# Patient Record
Sex: Male | Born: 1990 | Race: White | Hispanic: No | Smoking: Former smoker
Health system: Southern US, Community
[De-identification: ages and names within clinical notes are randomized; demographics above are authoritative.]

## PROBLEM LIST (undated history)

## (undated) DIAGNOSIS — L089 Local infection of the skin and subcutaneous tissue, unspecified: Secondary | ICD-10-CM

## (undated) DIAGNOSIS — A4902 Methicillin resistant Staphylococcus aureus infection, unspecified site: Secondary | ICD-10-CM

## (undated) DIAGNOSIS — IMO0002 Reserved for concepts with insufficient information to code with codable children: Secondary | ICD-10-CM

## (undated) DIAGNOSIS — R112 Nausea with vomiting, unspecified: Secondary | ICD-10-CM

## (undated) DIAGNOSIS — K56609 Unspecified intestinal obstruction, unspecified as to partial versus complete obstruction: Secondary | ICD-10-CM

## (undated) DIAGNOSIS — K259 Gastric ulcer, unspecified as acute or chronic, without hemorrhage or perforation: Secondary | ICD-10-CM

## (undated) DIAGNOSIS — K659 Peritonitis, unspecified: Secondary | ICD-10-CM

## (undated) DIAGNOSIS — K5090 Crohn's disease, unspecified, without complications: Secondary | ICD-10-CM

## (undated) DIAGNOSIS — K759 Inflammatory liver disease, unspecified: Secondary | ICD-10-CM

## (undated) HISTORY — PX: OTHER SURGICAL HISTORY: SHX169

## (undated) HISTORY — PX: APPENDECTOMY: SHX54

## (undated) HISTORY — PX: STOMACH SURGERY: SHX791

## (undated) HISTORY — PX: ABDOMINAL SURGERY: SHX537

---

## 2010-04-03 ENCOUNTER — Emergency Department (HOSPITAL_COMMUNITY): Admission: EM | Admit: 2010-04-03 | Discharge: 2010-04-03 | Payer: Self-pay | Admitting: Emergency Medicine

## 2010-04-10 ENCOUNTER — Emergency Department (HOSPITAL_COMMUNITY): Admission: EM | Admit: 2010-04-10 | Discharge: 2010-04-11 | Payer: Self-pay | Admitting: Emergency Medicine

## 2010-04-11 ENCOUNTER — Emergency Department (HOSPITAL_COMMUNITY): Admission: EM | Admit: 2010-04-11 | Discharge: 2010-04-11 | Payer: Self-pay | Admitting: Emergency Medicine

## 2010-04-13 ENCOUNTER — Emergency Department (HOSPITAL_COMMUNITY): Admission: EM | Admit: 2010-04-13 | Discharge: 2010-04-13 | Payer: Self-pay | Admitting: Emergency Medicine

## 2010-04-14 ENCOUNTER — Emergency Department (HOSPITAL_COMMUNITY): Admission: EM | Admit: 2010-04-14 | Discharge: 2010-04-15 | Payer: Self-pay | Admitting: Emergency Medicine

## 2010-04-15 ENCOUNTER — Emergency Department: Payer: Self-pay

## 2010-05-27 ENCOUNTER — Encounter: Payer: Self-pay | Admitting: Emergency Medicine

## 2010-05-27 ENCOUNTER — Inpatient Hospital Stay (HOSPITAL_COMMUNITY): Admission: EM | Admit: 2010-05-27 | Discharge: 2010-05-28 | Payer: Self-pay | Admitting: General Surgery

## 2010-05-28 ENCOUNTER — Emergency Department (HOSPITAL_COMMUNITY): Admission: EM | Admit: 2010-05-28 | Discharge: 2010-05-29 | Payer: Self-pay | Admitting: Emergency Medicine

## 2010-05-30 ENCOUNTER — Emergency Department (HOSPITAL_COMMUNITY): Admission: EM | Admit: 2010-05-30 | Discharge: 2010-05-30 | Payer: Self-pay | Admitting: Emergency Medicine

## 2010-06-13 ENCOUNTER — Emergency Department: Payer: Self-pay | Admitting: Emergency Medicine

## 2010-06-19 ENCOUNTER — Emergency Department (HOSPITAL_COMMUNITY): Admission: EM | Admit: 2010-06-19 | Discharge: 2010-06-19 | Payer: Self-pay | Admitting: Emergency Medicine

## 2010-06-20 ENCOUNTER — Emergency Department (HOSPITAL_COMMUNITY): Admission: EM | Admit: 2010-06-20 | Discharge: 2010-06-20 | Payer: Self-pay | Admitting: Emergency Medicine

## 2010-06-21 ENCOUNTER — Emergency Department (HOSPITAL_COMMUNITY)
Admission: EM | Admit: 2010-06-21 | Discharge: 2010-06-21 | Payer: Self-pay | Source: Home / Self Care | Admitting: Emergency Medicine

## 2010-06-21 ENCOUNTER — Emergency Department (HOSPITAL_COMMUNITY): Admission: EM | Admit: 2010-06-21 | Discharge: 2010-06-21 | Payer: Self-pay | Admitting: Emergency Medicine

## 2010-06-22 ENCOUNTER — Inpatient Hospital Stay (HOSPITAL_COMMUNITY): Admission: EM | Admit: 2010-06-22 | Discharge: 2010-06-23 | Payer: Self-pay | Admitting: Emergency Medicine

## 2010-08-15 ENCOUNTER — Emergency Department (HOSPITAL_COMMUNITY)
Admission: EM | Admit: 2010-08-15 | Discharge: 2010-08-15 | Payer: Self-pay | Source: Home / Self Care | Admitting: Emergency Medicine

## 2010-10-28 ENCOUNTER — Emergency Department (HOSPITAL_COMMUNITY): Admission: EM | Admit: 2010-10-28 | Discharge: 2010-06-24 | Payer: Self-pay | Admitting: Emergency Medicine

## 2011-02-03 LAB — POCT I-STAT, CHEM 8
Creatinine, Ser: 0.8 mg/dL (ref 0.4–1.5)
Glucose, Bld: 107 mg/dL — ABNORMAL HIGH (ref 70–99)
Hemoglobin: 12.6 g/dL — ABNORMAL LOW (ref 13.0–17.0)
TCO2: 26 mmol/L (ref 0–100)

## 2011-02-04 LAB — CBC
HCT: 31.3 % — ABNORMAL LOW (ref 39.0–52.0)
HCT: 31.7 % — ABNORMAL LOW (ref 39.0–52.0)
HCT: 34.1 % — ABNORMAL LOW (ref 39.0–52.0)
HCT: 37.1 % — ABNORMAL LOW (ref 39.0–52.0)
Hemoglobin: 10.7 g/dL — ABNORMAL LOW (ref 13.0–17.0)
Hemoglobin: 11.2 g/dL — ABNORMAL LOW (ref 13.0–17.0)
MCH: 31.8 pg (ref 26.0–34.0)
MCH: 32.3 pg (ref 26.0–34.0)
MCH: 33.6 pg (ref 26.0–34.0)
MCHC: 32.8 g/dL (ref 30.0–36.0)
MCHC: 34.4 g/dL (ref 30.0–36.0)
MCV: 96.4 fL (ref 78.0–100.0)
MCV: 96.6 fL (ref 78.0–100.0)
Platelets: 215 10*3/uL (ref 150–400)
Platelets: 216 10*3/uL (ref 150–400)
RBC: 3.24 MIL/uL — ABNORMAL LOW (ref 4.22–5.81)
RBC: 3.47 MIL/uL — ABNORMAL LOW (ref 4.22–5.81)
RBC: 3.52 MIL/uL — ABNORMAL LOW (ref 4.22–5.81)
RDW: 14.8 % (ref 11.5–15.5)
RDW: 14.9 % (ref 11.5–15.5)
RDW: 15.2 % (ref 11.5–15.5)
WBC: 4.2 10*3/uL (ref 4.0–10.5)
WBC: 4.3 10*3/uL (ref 4.0–10.5)

## 2011-02-04 LAB — COMPREHENSIVE METABOLIC PANEL
ALT: 10 U/L (ref 0–53)
AST: 18 U/L (ref 0–37)
Albumin: 4.1 g/dL (ref 3.5–5.2)
Alkaline Phosphatase: 56 U/L (ref 39–117)
BUN: 3 mg/dL — ABNORMAL LOW (ref 6–23)
BUN: 6 mg/dL (ref 6–23)
CO2: 26 mEq/L (ref 19–32)
Calcium: 8.9 mg/dL (ref 8.4–10.5)
Chloride: 108 mEq/L (ref 96–112)
Creatinine, Ser: 1.11 mg/dL (ref 0.4–1.5)
GFR calc Af Amer: >60 mL/min (ref >60)
GFR calc non Af Amer: >60 mL/min (ref >60)
Glucose, Bld: 89 mg/dL (ref 70–99)
Potassium: 3.6 mEq/L (ref 3.5–5.1)
Sodium: 141 mEq/L (ref 135–145)
Total Bilirubin: 0.4 mg/dL (ref 0.3–1.2)
Total Bilirubin: 0.7 mg/dL (ref 0.3–1.2)
Total Protein: 6.5 g/dL (ref 6.0–8.3)

## 2011-02-04 LAB — URINALYSIS, ROUTINE W REFLEX MICROSCOPIC
Bilirubin Urine: NEGATIVE
Hgb urine dipstick: NEGATIVE
Specific Gravity, Urine: 1.015 (ref 1.005–1.030)
Urobilinogen, UA: 0.2 mg/dL (ref 0.0–1.0)
pH: 6.5 (ref 5.0–8.0)

## 2011-02-04 LAB — BASIC METABOLIC PANEL
BUN: 7 mg/dL (ref 6–23)
CO2: 28 mEq/L (ref 19–32)
Calcium: 9.4 mg/dL (ref 8.4–10.5)
GFR calc non Af Amer: >60 mL/min (ref >60)
Glucose, Bld: 113 mg/dL — ABNORMAL HIGH (ref 70–99)
Potassium: 3.8 mEq/L (ref 3.5–5.1)

## 2011-02-04 LAB — DIFFERENTIAL
Basophils Absolute: 0 10*3/uL (ref 0.0–0.1)
Basophils Relative: 0 % (ref 0–1)
Eosinophils Absolute: 0.1 10*3/uL (ref 0.0–0.7)
Eosinophils Relative: 2 % (ref 0–5)
Lymphocytes Relative: 39 % (ref 12–46)
Lymphs Abs: 2.5 10*3/uL (ref 0.7–4.0)
Monocytes Absolute: 0.8 10*3/uL (ref 0.1–1.0)
Monocytes Relative: 12 % (ref 3–12)
Neutro Abs: 1.8 10*3/uL (ref 1.7–7.7)
Neutro Abs: 2.9 10*3/uL (ref 1.7–7.7)
Neutrophils Relative %: 41 % — ABNORMAL LOW (ref 43–77)

## 2011-02-04 LAB — MAGNESIUM: Magnesium: 1.8 mg/dL (ref 1.5–2.5)

## 2011-02-04 LAB — H. PYLORI ANTIBODY, IGG: H Pylori IgG: 0.6 {ISR}

## 2011-02-04 LAB — PHOSPHORUS: Phosphorus: 4.2 mg/dL (ref 2.3–4.6)

## 2011-02-04 LAB — MRSA PCR SCREENING: MRSA by PCR: NEGATIVE

## 2011-02-04 LAB — PROTIME-INR: Prothrombin Time: 15.4 seconds — ABNORMAL HIGH (ref 11.6–15.2)

## 2011-02-05 LAB — CBC
HCT: 36.9 % — ABNORMAL LOW (ref 39.0–52.0)
HCT: 39.7 % (ref 39.0–52.0)
Hemoglobin: 12.6 g/dL — ABNORMAL LOW (ref 13.0–17.0)
Hemoglobin: 13.6 g/dL (ref 13.0–17.0)
MCH: 33.6 pg (ref 26.0–34.0)
MCHC: 34.3 g/dL (ref 30.0–36.0)
MCV: 98 fL (ref 78.0–100.0)
RDW: 15.7 % — ABNORMAL HIGH (ref 11.5–15.5)
WBC: 7.4 10*3/uL (ref 4.0–10.5)

## 2011-02-05 LAB — COMPREHENSIVE METABOLIC PANEL
ALT: 11 U/L (ref 0–53)
ALT: 12 U/L (ref 0–53)
AST: 18 U/L (ref 0–37)
Alkaline Phosphatase: 69 U/L (ref 39–117)
Alkaline Phosphatase: 70 U/L (ref 39–117)
BUN: 5 mg/dL — ABNORMAL LOW (ref 6–23)
CO2: 25 mEq/L (ref 19–32)
Calcium: 9.9 mg/dL (ref 8.4–10.5)
GFR calc Af Amer: >60 mL/min (ref >60)
GFR calc non Af Amer: >60 mL/min (ref >60)
Glucose, Bld: 93 mg/dL (ref 70–99)
Glucose, Bld: 97 mg/dL (ref 70–99)
Potassium: 3.6 mEq/L (ref 3.5–5.1)
Sodium: 142 mEq/L (ref 135–145)
Sodium: 144 mEq/L (ref 135–145)
Total Protein: 7.2 g/dL (ref 6.0–8.3)

## 2011-02-05 LAB — RAPID URINE DRUG SCREEN, HOSP PERFORMED
Barbiturates: NOT DETECTED
Benzodiazepines: NOT DETECTED
Benzodiazepines: NOT DETECTED
Tetrahydrocannabinol: POSITIVE — AB

## 2011-02-05 LAB — URINALYSIS, ROUTINE W REFLEX MICROSCOPIC
Ketones, ur: NEGATIVE mg/dL
Ketones, ur: NEGATIVE mg/dL
Nitrite: NEGATIVE
Protein, ur: NEGATIVE mg/dL
Protein, ur: NEGATIVE mg/dL
Urobilinogen, UA: 0.2 mg/dL (ref 0.0–1.0)

## 2011-02-05 LAB — LIPASE, BLOOD: Lipase: 30 U/L (ref 11–59)

## 2011-02-05 LAB — HEMOCCULT GUIAC POC 1CARD (OFFICE)
Fecal Occult Bld: NEGATIVE
Fecal Occult Bld: NEGATIVE

## 2011-02-05 LAB — DIFFERENTIAL
Basophils Relative: 0 % (ref 0–1)
Basophils Relative: 1 % (ref 0–1)
Eosinophils Absolute: 0 10*3/uL (ref 0.0–0.7)
Eosinophils Absolute: 0 10*3/uL (ref 0.0–0.7)
Eosinophils Relative: 0 % (ref 0–5)
Lymphs Abs: 1 10*3/uL (ref 0.7–4.0)
Lymphs Abs: 1.6 10*3/uL (ref 0.7–4.0)
Monocytes Absolute: 0.5 10*3/uL (ref 0.1–1.0)
Monocytes Relative: 7 % (ref 3–12)
Neutro Abs: 4.2 10*3/uL (ref 1.7–7.7)
Neutrophils Relative %: 64 % (ref 43–77)
Neutrophils Relative %: 79 % — ABNORMAL HIGH (ref 43–77)

## 2011-02-05 LAB — ABO/RH: ABO/RH(D): O POS

## 2011-02-05 LAB — PROTIME-INR: Prothrombin Time: 13.8 seconds (ref 11.6–15.2)

## 2011-02-05 LAB — TYPE AND SCREEN
ABO/RH(D): O POS
Antibody Screen: NEGATIVE

## 2011-02-06 LAB — DIFFERENTIAL
Basophils Absolute: 0.1 10*3/uL (ref 0.0–0.1)
Basophils Relative: 1 % (ref 0–1)
Eosinophils Relative: 1 % (ref 0–5)
Monocytes Absolute: 1 10*3/uL (ref 0.1–1.0)

## 2011-02-06 LAB — CBC
Hemoglobin: 14.5 g/dL (ref 13.0–17.0)
MCH: 33 pg (ref 26.0–34.0)
MCV: 94.6 fL (ref 78.0–100.0)
Platelets: 196 10*3/uL (ref 150–400)
Platelets: 248 10*3/uL (ref 150–400)
RBC: 3.75 MIL/uL — ABNORMAL LOW (ref 4.22–5.81)
RBC: 4.38 MIL/uL (ref 4.22–5.81)
RDW: 16.6 % — ABNORMAL HIGH (ref 11.5–15.5)
WBC: 5 10*3/uL (ref 4.0–10.5)
WBC: 6.8 10*3/uL (ref 4.0–10.5)

## 2011-02-06 LAB — BASIC METABOLIC PANEL
BUN: 10 mg/dL (ref 6–23)
Calcium: 9.6 mg/dL (ref 8.4–10.5)
Creatinine, Ser: 1.05 mg/dL (ref 0.4–1.5)
GFR calc Af Amer: >60 mL/min (ref >60)
GFR calc non Af Amer: >60 mL/min (ref >60)

## 2011-02-06 LAB — COMPREHENSIVE METABOLIC PANEL
AST: 33 U/L (ref 0–37)
Albumin: 4.8 g/dL (ref 3.5–5.2)
Alkaline Phosphatase: 69 U/L (ref 39–117)
BUN: 17 mg/dL (ref 6–23)
CO2: 28 mEq/L (ref 19–32)
Chloride: 99 mEq/L (ref 96–112)
Creatinine, Ser: 1.33 mg/dL (ref 0.4–1.5)
GFR calc Af Amer: >60 mL/min (ref >60)
GFR calc non Af Amer: >60 mL/min (ref >60)
Potassium: 4.2 mEq/L (ref 3.5–5.1)
Total Bilirubin: 1 mg/dL (ref 0.3–1.2)

## 2011-02-06 LAB — GLUCOSE, CAPILLARY: Glucose-Capillary: 119 mg/dL — ABNORMAL HIGH (ref 70–99)

## 2011-02-07 LAB — COMPREHENSIVE METABOLIC PANEL
ALT: 12 U/L (ref 0–53)
AST: 20 U/L (ref 0–37)
AST: 21 U/L (ref 0–37)
Alkaline Phosphatase: 58 U/L (ref 39–117)
BUN: 8 mg/dL (ref 6–23)
BUN: 9 mg/dL (ref 6–23)
CO2: 27 mEq/L (ref 19–32)
CO2: 26 mEq/L (ref 19–32)
Calcium: 9.4 mg/dL (ref 8.4–10.5)
Calcium: 9.4 mg/dL (ref 8.4–10.5)
Calcium: 9.1 mg/dL (ref 8.4–10.5)
Chloride: 105 mEq/L (ref 96–112)
Creatinine, Ser: 1.05 mg/dL (ref 0.4–1.5)
Creatinine, Ser: 0.91 mg/dL (ref 0.4–1.5)
GFR calc Af Amer: >60 mL/min (ref >60)
GFR calc non Af Amer: >60 mL/min (ref >60)
GFR calc non Af Amer: >60 mL/min (ref >60)
Glucose, Bld: 105 mg/dL — ABNORMAL HIGH (ref 70–99)
Glucose, Bld: 122 mg/dL — ABNORMAL HIGH (ref 70–99)
Potassium: 4 mEq/L (ref 3.5–5.1)
Sodium: 135 mEq/L (ref 135–145)
Total Bilirubin: 0.6 mg/dL (ref 0.3–1.2)
Total Protein: 7.1 g/dL (ref 6.0–8.3)

## 2011-02-07 LAB — URINALYSIS, ROUTINE W REFLEX MICROSCOPIC
Bilirubin Urine: NEGATIVE
Glucose, UA: NEGATIVE mg/dL
Ketones, ur: NEGATIVE mg/dL
pH: 6 (ref 5.0–8.0)

## 2011-02-07 LAB — DIFFERENTIAL
Basophils Absolute: 0 10*3/uL (ref 0.0–0.1)
Basophils Relative: 0 % (ref 0–1)
Eosinophils Absolute: 0.1 10*3/uL (ref 0.0–0.7)
Eosinophils Absolute: 0.1 10*3/uL (ref 0.0–0.7)
Eosinophils Relative: 1 % (ref 0–5)
Eosinophils Relative: 1 % (ref 0–5)
Lymphocytes Relative: 23 % (ref 12–46)
Lymphs Abs: 1.6 10*3/uL (ref 0.7–4.0)
Lymphs Abs: 2 10*3/uL (ref 0.7–4.0)
Lymphs Abs: 1.4 10*3/uL (ref 0.7–4.0)
Monocytes Relative: 9 % (ref 3–12)
Monocytes Relative: 9 % (ref 3–12)
Neutro Abs: 5.1 10*3/uL (ref 1.7–7.7)
Neutrophils Relative %: 65 % (ref 43–77)
Neutrophils Relative %: 63 % (ref 43–77)

## 2011-02-07 LAB — LIPASE, BLOOD
Lipase: 21 U/L (ref 11–59)
Lipase: 18 U/L (ref 11–59)

## 2011-02-07 LAB — CBC
HCT: 35.7 % — ABNORMAL LOW (ref 39.0–52.0)
HCT: 37 % — ABNORMAL LOW (ref 39.0–52.0)
Hemoglobin: 11.8 g/dL — ABNORMAL LOW (ref 13.0–17.0)
Hemoglobin: 12.6 g/dL — ABNORMAL LOW (ref 13.0–17.0)
MCHC: 33 g/dL (ref 30.0–36.0)
MCHC: 33 g/dL (ref 30.0–36.0)
MCHC: 33.9 g/dL (ref 30.0–36.0)
MCV: 95.1 fL (ref 78.0–100.0)
RBC: 3.93 MIL/uL — ABNORMAL LOW (ref 4.22–5.81)
RBC: 3.9 MIL/uL — ABNORMAL LOW (ref 4.22–5.81)
RDW: 19.2 % — ABNORMAL HIGH (ref 11.5–15.5)
WBC: 10.1 10*3/uL (ref 4.0–10.5)
WBC: 6.3 10*3/uL (ref 4.0–10.5)

## 2011-02-07 LAB — PROTIME-INR: Prothrombin Time: 12.4 seconds (ref 11.6–15.2)

## 2011-02-07 LAB — APTT: aPTT: 35 seconds (ref 24–37)

## 2011-06-18 ENCOUNTER — Emergency Department (HOSPITAL_BASED_OUTPATIENT_CLINIC_OR_DEPARTMENT_OTHER)
Admission: EM | Admit: 2011-06-18 | Discharge: 2011-06-18 | Disposition: A | Discharge status: Home / Self Care | Payer: Self-pay | Attending: Emergency Medicine | Admitting: Emergency Medicine

## 2011-06-18 ENCOUNTER — Encounter: Payer: Self-pay | Admitting: *Deleted

## 2011-06-18 ENCOUNTER — Emergency Department (INDEPENDENT_AMBULATORY_CARE_PROVIDER_SITE_OTHER): Payer: Self-pay

## 2011-06-18 DIAGNOSIS — S20219A Contusion of unspecified front wall of thorax, initial encounter: Secondary | ICD-10-CM | POA: Insufficient documentation

## 2011-06-18 DIAGNOSIS — S298XXA Other specified injuries of thorax, initial encounter: Secondary | ICD-10-CM

## 2011-06-18 DIAGNOSIS — S335XXA Sprain of ligaments of lumbar spine, initial encounter: Secondary | ICD-10-CM | POA: Insufficient documentation

## 2011-06-18 DIAGNOSIS — R109 Unspecified abdominal pain: Secondary | ICD-10-CM | POA: Insufficient documentation

## 2011-06-18 DIAGNOSIS — W19XXXA Unspecified fall, initial encounter: Secondary | ICD-10-CM | POA: Insufficient documentation

## 2011-06-18 DIAGNOSIS — S301XXA Contusion of abdominal wall, initial encounter: Secondary | ICD-10-CM | POA: Insufficient documentation

## 2011-06-18 DIAGNOSIS — R112 Nausea with vomiting, unspecified: Secondary | ICD-10-CM | POA: Insufficient documentation

## 2011-06-18 DIAGNOSIS — K5090 Crohn's disease, unspecified, without complications: Secondary | ICD-10-CM | POA: Insufficient documentation

## 2011-06-18 HISTORY — DX: Crohn's disease, unspecified, without complications: K50.90

## 2011-06-18 HISTORY — DX: Gastric ulcer, unspecified as acute or chronic, without hemorrhage or perforation: K25.9

## 2011-06-18 LAB — COMPREHENSIVE METABOLIC PANEL
Albumin: 4.1 g/dL (ref 3.5–5.2)
Alkaline Phosphatase: 65 U/L (ref 39–117)
BUN: 16 mg/dL (ref 6–23)
CO2: 27 mEq/L (ref 19–32)
Chloride: 101 mEq/L (ref 96–112)
Creatinine, Ser: 0.9 mg/dL (ref 0.50–1.35)
GFR calc non Af Amer: >60 mL/min (ref >60)
Glucose, Bld: 98 mg/dL (ref 70–99)
Potassium: 4.5 mEq/L (ref 3.5–5.1)
Total Bilirubin: 0.1 mg/dL — ABNORMAL LOW (ref 0.3–1.2)

## 2011-06-18 LAB — CBC
HCT: 34.7 % — ABNORMAL LOW (ref 39.0–52.0)
Hemoglobin: 11.8 g/dL — ABNORMAL LOW (ref 13.0–17.0)
MCV: 98.3 fL (ref 78.0–100.0)
RBC: 3.53 MIL/uL — ABNORMAL LOW (ref 4.22–5.81)
RDW: 13.3 % (ref 11.5–15.5)
WBC: 7.4 10*3/uL (ref 4.0–10.5)

## 2011-06-18 LAB — URINALYSIS, ROUTINE W REFLEX MICROSCOPIC
Glucose, UA: NEGATIVE mg/dL
Ketones, ur: NEGATIVE mg/dL
Leukocytes, UA: NEGATIVE
Protein, ur: NEGATIVE mg/dL
Urobilinogen, UA: 0.2 mg/dL (ref 0.0–1.0)

## 2011-06-18 MED ORDER — HYDROMORPHONE HCL 1 MG/ML IJ SOLN
1.0000 mg | Freq: Once | INTRAMUSCULAR | Status: AC
  Administered 2011-06-18: 1 mg via INTRAVENOUS

## 2011-06-18 MED ORDER — FENTANYL CITRATE 0.05 MG/ML IJ SOLN
50.0000 ug | Freq: Once | INTRAMUSCULAR | Status: AC
  Administered 2011-06-18: 50 ug via INTRAVENOUS
  Filled 2011-06-18: qty 2

## 2011-06-18 MED ORDER — ONDANSETRON HCL 4 MG/2ML IJ SOLN
4.0000 mg | Freq: Once | INTRAMUSCULAR | Status: AC
  Administered 2011-06-18: 4 mg via INTRAVENOUS
  Filled 2011-06-18: qty 2

## 2011-06-18 MED ORDER — HYDROMORPHONE HCL 1 MG/ML IJ SOLN
INTRAMUSCULAR | Status: AC
  Administered 2011-06-18: 1 mg via INTRAVENOUS
  Filled 2011-06-18: qty 1

## 2011-06-18 MED ORDER — SODIUM CHLORIDE 0.9 % IV SOLN
Freq: Once | INTRAVENOUS | Status: AC
  Administered 2011-06-18: 12:00:00 via INTRAVENOUS

## 2011-06-18 MED ORDER — HYDROCODONE-ACETAMINOPHEN 5-500 MG PO TABS: 1.0000 | ORAL_TABLET | Freq: Four times a day (QID) | ORAL | Status: DC | PRN

## 2011-06-18 MED ORDER — HYDROMORPHONE HCL 1 MG/ML IJ SOLN
1.0000 mg | Freq: Once | INTRAMUSCULAR | Status: AC
  Administered 2011-06-18: 1 mg via INTRAVENOUS
  Filled 2011-06-18: qty 1
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
## 2011-06-24 ENCOUNTER — Emergency Department (INDEPENDENT_AMBULATORY_CARE_PROVIDER_SITE_OTHER): Payer: Self-pay

## 2011-06-24 ENCOUNTER — Emergency Department (HOSPITAL_BASED_OUTPATIENT_CLINIC_OR_DEPARTMENT_OTHER)
Admission: EM | Admit: 2011-06-24 | Discharge: 2011-06-24 | Disposition: A | Discharge status: Home / Self Care | Payer: Self-pay | Attending: Emergency Medicine | Admitting: Emergency Medicine

## 2011-06-24 ENCOUNTER — Encounter (HOSPITAL_BASED_OUTPATIENT_CLINIC_OR_DEPARTMENT_OTHER): Payer: Self-pay | Admitting: *Deleted

## 2011-06-24 DIAGNOSIS — R1084 Generalized abdominal pain: Secondary | ICD-10-CM

## 2011-06-24 DIAGNOSIS — R0602 Shortness of breath: Secondary | ICD-10-CM

## 2011-06-24 DIAGNOSIS — K5090 Crohn's disease, unspecified, without complications: Secondary | ICD-10-CM | POA: Insufficient documentation

## 2011-06-24 DIAGNOSIS — F17200 Nicotine dependence, unspecified, uncomplicated: Secondary | ICD-10-CM

## 2011-06-24 DIAGNOSIS — N200 Calculus of kidney: Secondary | ICD-10-CM

## 2011-06-24 DIAGNOSIS — R1011 Right upper quadrant pain: Secondary | ICD-10-CM

## 2011-06-24 DIAGNOSIS — R110 Nausea: Secondary | ICD-10-CM | POA: Insufficient documentation

## 2011-06-24 DIAGNOSIS — R079 Chest pain, unspecified: Secondary | ICD-10-CM

## 2011-06-24 MED ORDER — ONDANSETRON HCL 4 MG/2ML IJ SOLN
INTRAMUSCULAR | Status: AC
  Administered 2011-06-24: 4 mg via INTRAVENOUS
  Filled 2011-06-24: qty 2

## 2011-06-24 MED ORDER — BELLADONNA ALK-PHENOBARBITAL 16.2 MG PO TABS: 1.0000 | ORAL_TABLET | ORAL | Status: AC | PRN

## 2011-06-24 MED ORDER — ONDANSETRON HCL 4 MG/2ML IJ SOLN
4.0000 mg | Freq: Once | INTRAMUSCULAR | Status: AC
  Administered 2011-06-24: 4 mg via INTRAVENOUS
  Filled 2011-06-24: qty 2

## 2011-06-24 MED ORDER — HYDROMORPHONE HCL 1 MG/ML IJ SOLN
0.5000 mg | Freq: Once | INTRAMUSCULAR | Status: AC
  Administered 2011-06-24: 0.5 mg via INTRAVENOUS
  Filled 2011-06-24: qty 1

## 2011-06-24 MED ORDER — DIPHENHYDRAMINE HCL 50 MG/ML IJ SOLN
25.0000 mg | Freq: Once | INTRAMUSCULAR | Status: AC
  Administered 2011-06-24: 25 mg via INTRAVENOUS
  Filled 2011-06-24: qty 1

## 2011-06-24 MED ORDER — HYDROMORPHONE HCL 1 MG/ML IJ SOLN
0.5000 mg | Freq: Once | INTRAMUSCULAR | Status: AC
  Administered 2011-06-24: 0.5 mg via INTRAVENOUS

## 2011-06-24 MED ORDER — ONDANSETRON HCL 4 MG/2ML IJ SOLN
4.0000 mg | Freq: Once | INTRAMUSCULAR | Status: AC
  Administered 2011-06-24: 4 mg via INTRAVENOUS

## 2011-06-24 MED ORDER — DIPHENHYDRAMINE HCL 50 MG/ML IJ SOLN
25.0000 mg | Freq: Once | INTRAMUSCULAR | Status: AC
  Administered 2011-06-24: 25 mg via INTRAVENOUS

## 2011-06-24 NOTE — ED Notes (Signed)
Pt continues to request additional pain medication. Pt will drink PO contrast at 1800 and CT will be performed at 1900.
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
## 2011-06-24 NOTE — ED Notes (Signed)
Dr Dierdre Highman at bedside to discuss test results and d/c instructions

## 2011-06-24 NOTE — ED Notes (Signed)
Report received from [facility]. Assuming care of pt at this time. Pt states he is still hurting (8/10) and c/o itching to face. MD made aware and gave V.O for benadryl IV only, and states he would be in room to talk to pt shortly about test results.
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
## 2011-06-24 NOTE — ED Notes (Signed)
Pt finished 1st cup of oral contrast.

## 2011-06-24 NOTE — ED Notes (Addendum)
PT sts he is allergic to IVP dye. Dr. Theodoro Kalata informed. Order changed to PO contrast only, no IV contrast.

## 2011-12-20 ENCOUNTER — Emergency Department (HOSPITAL_COMMUNITY)
Admission: EM | Admit: 2011-12-20 | Discharge: 2011-12-20 | Discharge status: Against Medical Advice | Payer: No Typology Code available for payment source | Attending: Emergency Medicine | Admitting: Emergency Medicine

## 2011-12-20 DIAGNOSIS — Z043 Encounter for examination and observation following other accident: Secondary | ICD-10-CM | POA: Insufficient documentation

## 2011-12-25 ENCOUNTER — Encounter (HOSPITAL_COMMUNITY): Payer: Self-pay | Admitting: Emergency Medicine

## 2011-12-25 ENCOUNTER — Emergency Department (HOSPITAL_COMMUNITY): Payer: No Typology Code available for payment source

## 2011-12-25 ENCOUNTER — Emergency Department (HOSPITAL_COMMUNITY)
Admission: EM | Admit: 2011-12-25 | Discharge: 2011-12-25 | Disposition: A | Discharge status: Home / Self Care | Payer: No Typology Code available for payment source | Attending: Emergency Medicine | Admitting: Emergency Medicine

## 2011-12-25 DIAGNOSIS — M549 Dorsalgia, unspecified: Secondary | ICD-10-CM

## 2011-12-25 DIAGNOSIS — M545 Low back pain, unspecified: Secondary | ICD-10-CM | POA: Insufficient documentation

## 2011-12-25 DIAGNOSIS — R1110 Vomiting, unspecified: Secondary | ICD-10-CM | POA: Insufficient documentation

## 2011-12-25 DIAGNOSIS — R10812 Left upper quadrant abdominal tenderness: Secondary | ICD-10-CM | POA: Insufficient documentation

## 2011-12-25 DIAGNOSIS — R0602 Shortness of breath: Secondary | ICD-10-CM | POA: Insufficient documentation

## 2011-12-25 DIAGNOSIS — F17200 Nicotine dependence, unspecified, uncomplicated: Secondary | ICD-10-CM | POA: Insufficient documentation

## 2011-12-25 DIAGNOSIS — R079 Chest pain, unspecified: Secondary | ICD-10-CM | POA: Insufficient documentation

## 2011-12-25 DIAGNOSIS — R109 Unspecified abdominal pain: Secondary | ICD-10-CM | POA: Insufficient documentation

## 2011-12-25 LAB — COMPREHENSIVE METABOLIC PANEL
ALT: 8 U/L (ref 0–53)
AST: 14 U/L (ref 0–37)
Albumin: 4.7 g/dL (ref 3.5–5.2)
Calcium: 10.2 mg/dL (ref 8.4–10.5)
Creatinine, Ser: 0.82 mg/dL (ref 0.50–1.35)
Sodium: 142 mEq/L (ref 135–145)
Total Protein: 8 g/dL (ref 6.0–8.3)

## 2011-12-25 LAB — DIFFERENTIAL
Basophils Absolute: 0 10*3/uL (ref 0.0–0.1)
Basophils Relative: 1 % (ref 0–1)
Eosinophils Relative: 1 % (ref 0–5)
Monocytes Absolute: 0.7 10*3/uL (ref 0.1–1.0)

## 2011-12-25 LAB — CBC
HCT: 40.6 % (ref 39.0–52.0)
MCHC: 33.7 g/dL (ref 30.0–36.0)
MCV: 95.1 fL (ref 78.0–100.0)
Platelets: 258 10*3/uL (ref 150–400)
RDW: 13 % (ref 11.5–15.5)
WBC: 6.7 10*3/uL (ref 4.0–10.5)

## 2011-12-25 MED ORDER — DIPHENHYDRAMINE HCL 50 MG/ML IJ SOLN
INTRAMUSCULAR | Status: AC
  Filled 2011-12-25: qty 1

## 2011-12-25 MED ORDER — FENTANYL CITRATE 0.05 MG/ML IJ SOLN
50.0000 ug | Freq: Once | INTRAMUSCULAR | Status: AC
  Administered 2011-12-25: 50 ug via INTRAVENOUS
  Filled 2011-12-25: qty 2

## 2011-12-25 MED ORDER — METHYLPREDNISOLONE SODIUM SUCC 125 MG IJ SOLR
125.0000 mg | Freq: Once | INTRAMUSCULAR | Status: AC
  Administered 2011-12-25: 125 mg via INTRAVENOUS
  Filled 2011-12-25: qty 2

## 2011-12-25 MED ORDER — DIPHENHYDRAMINE HCL 50 MG/ML IJ SOLN
25.0000 mg | Freq: Once | INTRAMUSCULAR | Status: AC
  Administered 2011-12-25: 25 mg via INTRAVENOUS
  Filled 2011-12-25: qty 1

## 2011-12-25 MED ORDER — METHOCARBAMOL 500 MG PO TABS: 500.0000 mg | ORAL_TABLET | Freq: Two times a day (BID) | ORAL | Status: AC

## 2011-12-25 MED ORDER — HYDROMORPHONE HCL PF 1 MG/ML IJ SOLN: 1.0000 mg | Freq: Once | INTRAMUSCULAR | Status: DC

## 2011-12-25 MED ORDER — ONDANSETRON HCL 4 MG/2ML IJ SOLN
4.0000 mg | Freq: Once | INTRAMUSCULAR | Status: AC
  Administered 2011-12-25: 4 mg via INTRAVENOUS
  Filled 2011-12-25: qty 2

## 2011-12-25 MED ORDER — DIPHENHYDRAMINE HCL 50 MG/ML IJ SOLN
25.0000 mg | Freq: Once | INTRAMUSCULAR | Status: AC
  Administered 2011-12-25: 25 mg via INTRAVENOUS

## 2011-12-25 MED ORDER — IOHEXOL 300 MG/ML  SOLN
80.0000 mL | Freq: Once | INTRAMUSCULAR | Status: AC | PRN
  Administered 2011-12-25: 80 mL via INTRAVENOUS

## 2011-12-25 NOTE — ED Notes (Signed)
Pt has flat abdomen and bowel sounds present.  No blood in urine.  No loc.  Pt medicated and awaiting CT
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
## 2011-12-25 NOTE — ED Notes (Signed)
Pt stated the fentanyl is not controlling his pain.  Will notify MD.

## 2011-12-25 NOTE — ED Notes (Signed)
Ct aware that CT prep given and they said they would have to wait one hour before coming

## 2011-12-26 NOTE — ED Provider Notes (Signed)
Medical screening examination/treatment/procedure(s) were performed by non-physician practitioner and as supervising physician I was immediately available for consultation/collaboration.   Gwyneth Sprout, MD 12/26/11 0001

## 2012-06-08 ENCOUNTER — Emergency Department: Payer: Self-pay | Admitting: Emergency Medicine

## 2013-11-21 DIAGNOSIS — K659 Peritonitis, unspecified: Secondary | ICD-10-CM

## 2013-11-21 HISTORY — DX: Peritonitis, unspecified: K65.9

## 2013-11-21 HISTORY — PX: APPENDECTOMY: SHX54

## 2013-11-21 HISTORY — PX: ABDOMINAL SURGERY: SHX537

## 2014-06-23 ENCOUNTER — Encounter (HOSPITAL_COMMUNITY): Payer: Self-pay | Admitting: Emergency Medicine

## 2014-06-23 ENCOUNTER — Emergency Department (HOSPITAL_COMMUNITY)
Admission: EM | Admit: 2014-06-23 | Discharge: 2014-06-24 | Disposition: A | Discharge status: Home / Self Care | Payer: Self-pay | Attending: Emergency Medicine | Admitting: Emergency Medicine

## 2014-06-23 DIAGNOSIS — Y838 Other surgical procedures as the cause of abnormal reaction of the patient, or of later complication, without mention of misadventure at the time of the procedure: Secondary | ICD-10-CM | POA: Insufficient documentation

## 2014-06-23 DIAGNOSIS — R109 Unspecified abdominal pain: Secondary | ICD-10-CM | POA: Insufficient documentation

## 2014-06-23 DIAGNOSIS — Z8719 Personal history of other diseases of the digestive system: Secondary | ICD-10-CM | POA: Insufficient documentation

## 2014-06-23 DIAGNOSIS — Z880 Allergy status to penicillin: Secondary | ICD-10-CM | POA: Insufficient documentation

## 2014-06-23 DIAGNOSIS — R112 Nausea with vomiting, unspecified: Secondary | ICD-10-CM | POA: Insufficient documentation

## 2014-06-23 DIAGNOSIS — T8140XA Infection following a procedure, unspecified, initial encounter: Secondary | ICD-10-CM | POA: Insufficient documentation

## 2014-06-23 DIAGNOSIS — L089 Local infection of the skin and subcutaneous tissue, unspecified: Secondary | ICD-10-CM

## 2014-06-23 DIAGNOSIS — F17200 Nicotine dependence, unspecified, uncomplicated: Secondary | ICD-10-CM | POA: Insufficient documentation

## 2014-06-23 DIAGNOSIS — S31109A Unspecified open wound of abdominal wall, unspecified quadrant without penetration into peritoneal cavity, initial encounter: Secondary | ICD-10-CM

## 2014-06-23 DIAGNOSIS — Z872 Personal history of diseases of the skin and subcutaneous tissue: Secondary | ICD-10-CM | POA: Insufficient documentation

## 2014-06-23 HISTORY — DX: Reserved for concepts with insufficient information to code with codable children: IMO0002

## 2014-06-23 HISTORY — DX: Unspecified intestinal obstruction, unspecified as to partial versus complete obstruction: K56.609

## 2014-06-23 LAB — COMPREHENSIVE METABOLIC PANEL
ALK PHOS: 101 U/L (ref 39–117)
ALT: 21 U/L (ref 0–53)
AST: 23 U/L (ref 0–37)
Albumin: 3.6 g/dL (ref 3.5–5.2)
Anion gap: 12 (ref 5–15)
BUN: 9 mg/dL (ref 6–23)
CO2: 25 mEq/L (ref 19–32)
Calcium: 9.2 mg/dL (ref 8.4–10.5)
Chloride: 97 mEq/L (ref 96–112)
Creatinine, Ser: 0.64 mg/dL (ref 0.50–1.35)
GFR calc Af Amer: >90 mL/min (ref >90)
GFR calc non Af Amer: >90 mL/min (ref >90)
Glucose, Bld: 97 mg/dL (ref 70–99)
Potassium: 4.3 mEq/L (ref 3.7–5.3)
SODIUM: 134 meq/L — AB (ref 137–147)
TOTAL PROTEIN: 7.6 g/dL (ref 6.0–8.3)
Total Bilirubin: 0.2 mg/dL — ABNORMAL LOW (ref 0.3–1.2)

## 2014-06-23 LAB — CBC WITH DIFFERENTIAL/PLATELET
BASOS PCT: 0 % (ref 0–1)
Basophils Absolute: 0 10*3/uL (ref 0.0–0.1)
EOS PCT: 5 % (ref 0–5)
Eosinophils Absolute: 0.3 10*3/uL (ref 0.0–0.7)
HCT: 31.9 % — ABNORMAL LOW (ref 39.0–52.0)
HEMOGLOBIN: 10.4 g/dL — AB (ref 13.0–17.0)
Lymphocytes Relative: 35 % (ref 12–46)
Lymphs Abs: 1.9 10*3/uL (ref 0.7–4.0)
MCH: 29.2 pg (ref 26.0–34.0)
MCHC: 32.6 g/dL (ref 30.0–36.0)
MCV: 89.6 fL (ref 78.0–100.0)
MONO ABS: 1.2 10*3/uL — AB (ref 0.1–1.0)
Monocytes Relative: 21 % — ABNORMAL HIGH (ref 3–12)
Neutro Abs: 2.1 10*3/uL (ref 1.7–7.7)
Neutrophils Relative %: 39 % — ABNORMAL LOW (ref 43–77)
Platelets: 300 10*3/uL (ref 150–400)
RBC: 3.56 MIL/uL — ABNORMAL LOW (ref 4.22–5.81)
RDW: 13.7 % (ref 11.5–15.5)
WBC: 5.5 10*3/uL (ref 4.0–10.5)

## 2014-06-23 MED ORDER — METHYLPREDNISOLONE SODIUM SUCC 125 MG IJ SOLR
125.0000 mg | Freq: Once | INTRAMUSCULAR | Status: AC
  Administered 2014-06-23: 125 mg via INTRAVENOUS
  Filled 2014-06-23: qty 2

## 2014-06-23 MED ORDER — HYDROMORPHONE HCL PF 1 MG/ML IJ SOLN
1.0000 mg | Freq: Once | INTRAMUSCULAR | Status: AC
  Administered 2014-06-23: 1 mg via INTRAVENOUS
  Filled 2014-06-23: qty 1

## 2014-06-23 MED ORDER — ONDANSETRON 4 MG PO TBDP
8.0000 mg | ORAL_TABLET | Freq: Once | ORAL | Status: AC
  Administered 2014-06-24: 8 mg via ORAL
  Filled 2014-06-23: qty 2

## 2014-06-23 MED ORDER — DIPHENHYDRAMINE HCL 50 MG/ML IJ SOLN
50.0000 mg | Freq: Once | INTRAMUSCULAR | Status: AC
  Administered 2014-06-23: 50 mg via INTRAVENOUS
  Filled 2014-06-23: qty 1

## 2014-06-23 MED ORDER — ONDANSETRON HCL 4 MG/2ML IJ SOLN
4.0000 mg | Freq: Once | INTRAMUSCULAR | Status: AC
  Administered 2014-06-23: 4 mg via INTRAVENOUS
  Filled 2014-06-23: qty 2

## 2014-06-23 NOTE — ED Notes (Signed)
Wet to dry dressing placed to abdominal wound.  Pt tolerated well.
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
## 2014-06-23 NOTE — ED Notes (Signed)
Abdominal binder placed per v.o. Dr Lenna Sciara.
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
## 2014-06-23 NOTE — ED Notes (Signed)
The patient is unable to give an urine specimen at this time. The patient has been advised to use call light for assistance. The tech has reported to the RN in charge.

## 2014-06-23 NOTE — ED Notes (Signed)
Fluid challenge initiated 
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
## 2014-06-24 MED ORDER — OXYCODONE-ACETAMINOPHEN 5-325 MG PO TABS: 1.0000 | ORAL_TABLET | Freq: Four times a day (QID) | ORAL | Status: DC | PRN

## 2014-06-24 MED ORDER — METRONIDAZOLE 500 MG PO TABS
500.0000 mg | ORAL_TABLET | Freq: Once | ORAL | Status: AC
  Administered 2014-06-24: 500 mg via ORAL
  Filled 2014-06-24: qty 1

## 2014-06-24 MED ORDER — CIPROFLOXACIN HCL 500 MG PO TABS
500.0000 mg | ORAL_TABLET | Freq: Once | ORAL | Status: AC
  Administered 2014-06-24: 500 mg via ORAL
  Filled 2014-06-24: qty 1

## 2014-06-24 MED ORDER — OXYCODONE-ACETAMINOPHEN 5-325 MG PO TABS
1.0000 | ORAL_TABLET | Freq: Once | ORAL | Status: AC
  Administered 2014-06-24: 1 via ORAL
  Filled 2014-06-24: qty 1

## 2014-06-24 MED ORDER — METRONIDAZOLE 500 MG PO TABS: 500.0000 mg | ORAL_TABLET | Freq: Two times a day (BID) | ORAL | Status: DC

## 2014-06-24 MED ORDER — ONDANSETRON HCL 8 MG PO TABS: 8.0000 mg | ORAL_TABLET | Freq: Three times a day (TID) | ORAL | Status: DC | PRN

## 2014-06-24 MED ORDER — CIPROFLOXACIN HCL 500 MG PO TABS: 500.0000 mg | ORAL_TABLET | Freq: Two times a day (BID) | ORAL | Status: DC

## 2014-06-24 NOTE — Discharge Instructions (Signed)
A wet to dry dressing should be placed on the wound daily and abdominal binder replaced. Return for vomiting fever or concern for any reason. Medicine has been prescribed for pain and for nausea as well as antibiotics.

## 2014-12-05 ENCOUNTER — Encounter (HOSPITAL_COMMUNITY): Payer: Self-pay | Admitting: Emergency Medicine

## 2015-05-16 ENCOUNTER — Emergency Department (HOSPITAL_COMMUNITY): Payer: Self-pay

## 2015-05-16 ENCOUNTER — Emergency Department (HOSPITAL_COMMUNITY)
Admission: EM | Admit: 2015-05-16 | Discharge: 2015-05-16 | Disposition: A | Discharge status: Home / Self Care | Payer: Self-pay | Attending: Emergency Medicine | Admitting: Emergency Medicine

## 2015-05-16 ENCOUNTER — Encounter (HOSPITAL_COMMUNITY): Payer: Self-pay | Admitting: *Deleted

## 2015-05-16 DIAGNOSIS — Y9355 Activity, bike riding: Secondary | ICD-10-CM | POA: Insufficient documentation

## 2015-05-16 DIAGNOSIS — Z880 Allergy status to penicillin: Secondary | ICD-10-CM | POA: Insufficient documentation

## 2015-05-16 DIAGNOSIS — Z720 Tobacco use: Secondary | ICD-10-CM | POA: Insufficient documentation

## 2015-05-16 DIAGNOSIS — Y998 Other external cause status: Secondary | ICD-10-CM | POA: Insufficient documentation

## 2015-05-16 DIAGNOSIS — Z8719 Personal history of other diseases of the digestive system: Secondary | ICD-10-CM | POA: Insufficient documentation

## 2015-05-16 DIAGNOSIS — Y92410 Unspecified street and highway as the place of occurrence of the external cause: Secondary | ICD-10-CM | POA: Insufficient documentation

## 2015-05-16 DIAGNOSIS — R109 Unspecified abdominal pain: Secondary | ICD-10-CM

## 2015-05-16 DIAGNOSIS — S3991XA Unspecified injury of abdomen, initial encounter: Secondary | ICD-10-CM | POA: Insufficient documentation

## 2015-05-16 LAB — COMPREHENSIVE METABOLIC PANEL
ALBUMIN: 4.2 g/dL (ref 3.5–5.0)
ALK PHOS: 73 U/L (ref 38–126)
ALT: 28 U/L (ref 17–63)
AST: 31 U/L (ref 15–41)
Anion gap: 12 (ref 5–15)
BILIRUBIN TOTAL: 0.6 mg/dL (ref 0.3–1.2)
BUN: 7 mg/dL (ref 6–20)
CHLORIDE: 98 mmol/L — AB (ref 101–111)
CO2: 26 mmol/L (ref 22–32)
Calcium: 9.1 mg/dL (ref 8.9–10.3)
Creatinine, Ser: 0.8 mg/dL (ref 0.61–1.24)
GLUCOSE: 115 mg/dL — AB (ref 65–99)
Potassium: 3.1 mmol/L — ABNORMAL LOW (ref 3.5–5.1)
SODIUM: 136 mmol/L (ref 135–145)
Total Protein: 7.3 g/dL (ref 6.5–8.1)

## 2015-05-16 LAB — CBC WITH DIFFERENTIAL/PLATELET
BASOS ABS: 0 10*3/uL (ref 0.0–0.1)
Basophils Relative: 1 % (ref 0–1)
EOS PCT: 2 % (ref 0–5)
Eosinophils Absolute: 0.1 10*3/uL (ref 0.0–0.7)
HEMATOCRIT: 38.1 % — AB (ref 39.0–52.0)
Hemoglobin: 13.1 g/dL (ref 13.0–17.0)
LYMPHS ABS: 2.1 10*3/uL (ref 0.7–4.0)
Lymphocytes Relative: 42 % (ref 12–46)
MCH: 30.4 pg (ref 26.0–34.0)
MCHC: 34.4 g/dL (ref 30.0–36.0)
MCV: 88.4 fL (ref 78.0–100.0)
MONOS PCT: 16 % — AB (ref 3–12)
Monocytes Absolute: 0.8 10*3/uL (ref 0.1–1.0)
NEUTROS ABS: 2 10*3/uL (ref 1.7–7.7)
NEUTROS PCT: 39 % — AB (ref 43–77)
Platelets: 278 10*3/uL (ref 150–400)
RBC: 4.31 MIL/uL (ref 4.22–5.81)
RDW: 13.6 % (ref 11.5–15.5)
WBC: 5 10*3/uL (ref 4.0–10.5)

## 2015-05-16 LAB — URINALYSIS, ROUTINE W REFLEX MICROSCOPIC
Bilirubin Urine: NEGATIVE
Glucose, UA: NEGATIVE mg/dL
Hgb urine dipstick: NEGATIVE
Ketones, ur: NEGATIVE mg/dL
LEUKOCYTES UA: NEGATIVE
Nitrite: NEGATIVE
PH: 7.5 (ref 5.0–8.0)
Protein, ur: NEGATIVE mg/dL
SPECIFIC GRAVITY, URINE: 1.028 (ref 1.005–1.030)
UROBILINOGEN UA: 0.2 mg/dL (ref 0.0–1.0)

## 2015-05-16 LAB — LIPASE, BLOOD: LIPASE: 10 U/L — AB (ref 22–51)

## 2015-05-16 MED ORDER — HYDROMORPHONE HCL 1 MG/ML IJ SOLN
1.0000 mg | Freq: Once | INTRAMUSCULAR | Status: AC
  Administered 2015-05-16: 1 mg via INTRAVENOUS
  Filled 2015-05-16: qty 1

## 2015-05-16 MED ORDER — METHYLPREDNISOLONE SODIUM SUCC 125 MG IJ SOLR
125.0000 mg | Freq: Once | INTRAMUSCULAR | Status: AC
  Administered 2015-05-16: 125 mg via INTRAVENOUS
  Filled 2015-05-16: qty 2

## 2015-05-16 MED ORDER — DIPHENHYDRAMINE HCL 50 MG/ML IJ SOLN
25.0000 mg | Freq: Once | INTRAMUSCULAR | Status: AC
  Administered 2015-05-16: 25 mg via INTRAVENOUS
  Filled 2015-05-16: qty 1

## 2015-05-16 MED ORDER — IOHEXOL 300 MG/ML  SOLN
100.0000 mL | Freq: Once | INTRAMUSCULAR | Status: AC | PRN
  Administered 2015-05-16: 100 mL via INTRAVENOUS

## 2015-05-16 MED ORDER — ONDANSETRON HCL 4 MG/2ML IJ SOLN
4.0000 mg | Freq: Once | INTRAMUSCULAR | Status: AC
  Administered 2015-05-16: 4 mg via INTRAVENOUS
  Filled 2015-05-16: qty 2

## 2015-05-16 MED ORDER — DIPHENHYDRAMINE HCL 50 MG/ML IJ SOLN: 25.0000 mg | Freq: Once | INTRAMUSCULAR | Status: DC

## 2015-05-16 MED ORDER — METHYLPREDNISOLONE SODIUM SUCC 125 MG IJ SOLR: 125.0000 mg | Freq: Once | INTRAMUSCULAR | Status: DC

## 2015-05-16 MED ORDER — HYDROMORPHONE HCL 1 MG/ML IJ SOLN
0.5000 mg | Freq: Once | INTRAMUSCULAR | Status: AC
  Administered 2015-05-16: 0.5 mg via INTRAVENOUS
  Filled 2015-05-16: qty 1

## 2015-05-16 MED ORDER — PROMETHAZINE HCL 25 MG PO TABS: 25.0000 mg | ORAL_TABLET | Freq: Four times a day (QID) | ORAL | Status: DC | PRN

## 2015-05-16 MED ORDER — OXYCODONE-ACETAMINOPHEN 5-325 MG PO TABS: 1.0000 | ORAL_TABLET | Freq: Four times a day (QID) | ORAL | Status: DC | PRN
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
## 2015-05-16 NOTE — ED Notes (Signed)
Patient transported to CT 

## 2015-05-16 NOTE — ED Notes (Signed)
MD at bedside. Dr. Cook at bedside.  

## 2015-05-16 NOTE — Discharge Instructions (Signed)
Scan showed no acute findings. Medication for pain and nausea. Clear liquids tonight. Return if worse
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
## 2015-09-11 ENCOUNTER — Emergency Department (HOSPITAL_COMMUNITY)
Admission: EM | Admit: 2015-09-11 | Discharge: 2015-09-11 | Disposition: A | Discharge status: Home / Self Care | Payer: Self-pay | Attending: Emergency Medicine | Admitting: Emergency Medicine

## 2015-09-11 ENCOUNTER — Emergency Department (HOSPITAL_COMMUNITY): Payer: Self-pay

## 2015-09-11 ENCOUNTER — Encounter (HOSPITAL_COMMUNITY): Payer: Self-pay | Admitting: *Deleted

## 2015-09-11 DIAGNOSIS — Z880 Allergy status to penicillin: Secondary | ICD-10-CM | POA: Insufficient documentation

## 2015-09-11 DIAGNOSIS — S299XXA Unspecified injury of thorax, initial encounter: Secondary | ICD-10-CM | POA: Insufficient documentation

## 2015-09-11 DIAGNOSIS — Y9389 Activity, other specified: Secondary | ICD-10-CM | POA: Insufficient documentation

## 2015-09-11 DIAGNOSIS — Y999 Unspecified external cause status: Secondary | ICD-10-CM | POA: Insufficient documentation

## 2015-09-11 DIAGNOSIS — Z720 Tobacco use: Secondary | ICD-10-CM | POA: Insufficient documentation

## 2015-09-11 DIAGNOSIS — Y9289 Other specified places as the place of occurrence of the external cause: Secondary | ICD-10-CM | POA: Insufficient documentation

## 2015-09-11 MED ORDER — TRAMADOL HCL 50 MG PO TABS
50.0000 mg | ORAL_TABLET | Freq: Once | ORAL | Status: DC
  Filled 2015-09-11: qty 1

## 2015-09-11 MED ORDER — TRAMADOL HCL 50 MG PO TABS: 50.0000 mg | ORAL_TABLET | Freq: Two times a day (BID) | ORAL | Status: DC | PRN

## 2015-09-11 MED ORDER — ACETAMINOPHEN 500 MG PO TABS
1000.0000 mg | ORAL_TABLET | Freq: Once | ORAL | Status: AC
  Administered 2015-09-11: 1000 mg via ORAL
  Filled 2015-09-11: qty 2
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
## 2015-09-11 NOTE — Discharge Instructions (Signed)
Bike Safety, Adult Travis Callahan, continue to take tylenol 1000mg  every 8 hours as needed for pain.  Take tramadol if the pain becomes severe.  See a primary care doctor within 3 days for close follow up.  If any symptoms worsen, come back to the ED immediately. Thank you. Riding a bike is a fun activity that is good for your health. However, it is important that you know how to stay safe while biking. WHAT DO I NEED TO WEAR WHILE BIKING?  Helmet A helmet is the most important piece of equipment that you can wear to protect yourself while riding a bike. Make sure that you:  Always wear a helmet when you ride a bike, and make sure that the straps are fastened.  Wear a helmet that is specifically made for biking.  Have a helmet that has been safety-approved. Look for a helmet that has a Dance movement psychotherapist) sticker. If you have any questions, ask them at the store where you are buying the helmet. Never buy a used helmet.  Get a new helmet if you get into a bike accident. You should also get a new helmet every five years or sooner.  Have a helmet that is well-ventilated. A helmet will not help to protect you if it does not fit properly. Here are some tips to make sure your helmet fits:  The helmet should sit on top of your head. It should not tip backward or forward.  Find the smallest helmet shell size that fits over your head.  Do not use helmet pads to make a helmet fit if it is too big for your head.  Leave space for about two fingers between your eyebrows and the front brim of the helmet.  The straps should be joined under each of your ears at the jawbone.  The buckle should be snug when your mouth is completely open. Other equipment Make sure that you wear:  Shoes that are safe for biking, such as sneakers. The shoes should not slip on the pedals. Do not ride a bike barefoot.  Do not wear flip flops.  Do not wear cleats.  Do not wear shoes with  heels.  Pants that are fitted, if you are wearing pants. If your pants are too loose or wide at the bottom, they can get stuck in the bike chain.  Bright or fluorescent clothes. This helps you to be visible. Avoid dark-colored clothes.  Reflective tape is also helpful.  Clothes that are comfortable and appropriate for the weather. WHAT RULES DO I NEED TO KNOW TO BIKE SAFELY? You need to know to:  Obey all traffic signs. These include:  Stop signs.  Traffic lights.  Bike in the same direction as the cars. Never bike against traffic.  Use hand signals, including signals to:  Make a left-hand turn.  Make a right-hand turn.  Stop.  Never listen to headphones while riding a bike.  Never text or talk on a cell phone while riding a bike.  Never stand up while riding a bike.  Always stop and check for pedestrians, cars, and any other traffic whenever you start a bike ride. Always look in both directions.  Never have more than one adult on a bike. If you are carrying a child in a bike seat, make sure that the bike seat or carrier has been safety-approved.  Be careful. Watch for:  Cars opening up doors.  Cars leaving driveways.  Pedestrians.  Road hazards, such  as potholes or puddles.  Ride in single file if you are riding in a group.  Walk your bike across busy intersections.  Pass on the left side, if you are passing a pedestrian or another biker. Call out that you are on the left so the pedestrian or biker knows that you are there.  Never attach your bike to another moving object, vehicle, or pet.  Always hold the handlebars with both hands.  Always use bike lanes or paths when they are available. WHAT SHOULD I CHECK BEFORE RIDING A BIKE? You should always check that:  Your helmet fits properly. This is important because straps can loosen over time.  The bike's front and back brakes work.  The bike's tires are inflated properly.  The seat is at the correct  level.  The chain is not loose, rusted, or making cracking or grinding noises when in use. WHEN SHOULD I AVOID RIDING A BIKE? Do not ride a bike:  If the weather conditions are unsafe, such as during a thunderstorm or if the roads are icy.  If it is dark outside. If you must ride at night, make sure that you wear bright clothing and have reflectors or lights in the front and back of the bike.  If you have been drinking alcohol or using drugs.  If your health care provider has advised you not to ride a bike.   This information is not intended to replace advice given to you by your health care provider. Make sure you discuss any questions you have with your health care provider.   Document Released: 01/28/2004 Document Revised: 11/28/2014 Document Reviewed: 10/01/2014 Elsevier Interactive Patient Education 2016 [employer] Back Pain, Adult Back pain is very common. The pain often gets better over time. The cause of back pain is usually not dangerous. Most people can learn to manage their back pain on their own.  HOME CARE  Watch your back pain for any changes. The following actions may help to lessen any pain you are feeling:  Stay active. Start with short walks on flat ground if you can. Try to walk farther each day.  Exercise regularly as told by your doctor. Exercise helps your back heal faster. It also helps avoid future injury by keeping your muscles strong and flexible.  Do not sit, drive, or stand in one place for more than 30 minutes.  Do not stay in bed. Resting more than 1-2 days can slow down your recovery.  Be careful when you bend or lift an object. Use good form when lifting:  Bend at your knees.  Keep the object close to your body.  Do not twist.  Sleep on a firm mattress. Lie on your side, and bend your knees. If you lie on your back, put a pillow under your knees.  Take medicines only as told by your doctor.  Put ice on the injured area.  Put ice in a  plastic bag.  Place a towel between your skin and the bag.  Leave the ice on for 20 minutes, 2-3 times a day for the first 2-3 days. After that, you can switch between ice and heat packs.  Avoid feeling anxious or stressed. Find good ways to deal with stress, such as exercise.  Maintain a healthy weight. Extra weight puts stress on your back. GET HELP IF:   You have pain that does not go away with rest or medicine.  You have worsening pain that goes down into your legs  or buttocks.  You have pain that does not get better in one week.  You have pain at night.  You lose weight.  You have a fever or chills. GET HELP RIGHT AWAY IF:   You cannot control when you poop (bowel movement) or pee (urinate).  Your arms or legs feel weak.  Your arms or legs lose feeling (numbness).  You feel sick to your stomach (nauseous) or throw up (vomit).  You have belly (abdominal) pain.  You feel like you may pass out (faint).   This information is not intended to replace advice given to you by your health care provider. Make sure you discuss any questions you have with your health care provider.   Document Released: 04/25/2008 Document Revised: 11/28/2014 Document Reviewed: 03/11/2014 Elsevier Interactive Patient Education [employer].

## 2015-10-10 ENCOUNTER — Observation Stay (HOSPITAL_COMMUNITY)
Admission: EM | Admit: 2015-10-10 | Discharge: 2015-10-10 | Disposition: A | Discharge status: Home / Self Care | Payer: Self-pay | Attending: Emergency Medicine | Admitting: Emergency Medicine

## 2015-10-10 ENCOUNTER — Encounter (HOSPITAL_COMMUNITY): Payer: Self-pay | Admitting: *Deleted

## 2015-10-10 DIAGNOSIS — Z8719 Personal history of other diseases of the digestive system: Secondary | ICD-10-CM | POA: Insufficient documentation

## 2015-10-10 DIAGNOSIS — L0390 Cellulitis, unspecified: Secondary | ICD-10-CM | POA: Diagnosis present

## 2015-10-10 DIAGNOSIS — F17210 Nicotine dependence, cigarettes, uncomplicated: Secondary | ICD-10-CM | POA: Insufficient documentation

## 2015-10-10 DIAGNOSIS — Z8614 Personal history of Methicillin resistant Staphylococcus aureus infection: Secondary | ICD-10-CM | POA: Insufficient documentation

## 2015-10-10 DIAGNOSIS — R000 Tachycardia, unspecified: Secondary | ICD-10-CM | POA: Insufficient documentation

## 2015-10-10 DIAGNOSIS — L03113 Cellulitis of right upper limb: Principal | ICD-10-CM | POA: Insufficient documentation

## 2015-10-10 DIAGNOSIS — L02413 Cutaneous abscess of right upper limb: Secondary | ICD-10-CM | POA: Insufficient documentation

## 2015-10-10 DIAGNOSIS — L0291 Cutaneous abscess, unspecified: Secondary | ICD-10-CM

## 2015-10-10 DIAGNOSIS — Z5321 Procedure and treatment not carried out due to patient leaving prior to being seen by health care provider: Secondary | ICD-10-CM | POA: Insufficient documentation

## 2015-10-10 DIAGNOSIS — Z79899 Other long term (current) drug therapy: Secondary | ICD-10-CM | POA: Insufficient documentation

## 2015-10-10 HISTORY — DX: Methicillin resistant Staphylococcus aureus infection, unspecified site: A49.02

## 2015-10-10 HISTORY — DX: Peritonitis, unspecified: K65.9

## 2015-10-10 HISTORY — DX: Gastric ulcer, unspecified as acute or chronic, without hemorrhage or perforation: K25.9

## 2015-10-10 LAB — ETHANOL

## 2015-10-10 LAB — CBC WITH DIFFERENTIAL/PLATELET
BASOS ABS: 0 10*3/uL (ref 0.0–0.1)
Basophils Relative: 0 %
EOS PCT: 1 %
Eosinophils Absolute: 0.1 10*3/uL (ref 0.0–0.7)
HEMATOCRIT: 34.3 % — AB (ref 39.0–52.0)
Hemoglobin: 11.7 g/dL — ABNORMAL LOW (ref 13.0–17.0)
LYMPHS ABS: 2.4 10*3/uL (ref 0.7–4.0)
LYMPHS PCT: 25 %
MCH: 29.3 pg (ref 26.0–34.0)
MCHC: 34.1 g/dL (ref 30.0–36.0)
MCV: 85.8 fL (ref 78.0–100.0)
MONO ABS: 0.9 10*3/uL (ref 0.1–1.0)
MONOS PCT: 10 %
NEUTROS ABS: 6.1 10*3/uL (ref 1.7–7.7)
Neutrophils Relative %: 64 %
PLATELETS: 429 10*3/uL — AB (ref 150–400)
RBC: 4 MIL/uL — ABNORMAL LOW (ref 4.22–5.81)
RDW: 14.6 % (ref 11.5–15.5)
WBC: 9.6 10*3/uL (ref 4.0–10.5)

## 2015-10-10 LAB — COMPREHENSIVE METABOLIC PANEL
ALT: 24 U/L (ref 17–63)
AST: 30 U/L (ref 15–41)
Albumin: 3.9 g/dL (ref 3.5–5.0)
Alkaline Phosphatase: 79 U/L (ref 38–126)
Anion gap: 12 (ref 5–15)
BUN: 14 mg/dL (ref 6–20)
CHLORIDE: 107 mmol/L (ref 101–111)
CO2: 22 mmol/L (ref 22–32)
CREATININE: 0.87 mg/dL (ref 0.61–1.24)
Calcium: 9.6 mg/dL (ref 8.9–10.3)
GLUCOSE: 85 mg/dL (ref 65–99)
Potassium: 4 mmol/L (ref 3.5–5.1)
Sodium: 141 mmol/L (ref 135–145)
Total Bilirubin: 0.6 mg/dL (ref 0.3–1.2)
Total Protein: 7.7 g/dL (ref 6.5–8.1)

## 2015-10-10 LAB — I-STAT CG4 LACTIC ACID, ED: Lactic Acid, Venous: 1.57 mmol/L (ref 0.5–2.0)

## 2015-10-10 MED ORDER — SODIUM CHLORIDE 0.9 % IV BOLUS (SEPSIS)
500.0000 mL | INTRAVENOUS | Status: AC
Start: 2015-10-10 — End: 2015-10-10
  Administered 2015-10-10: 500 mL via INTRAVENOUS

## 2015-10-10 MED ORDER — ONDANSETRON HCL 4 MG/2ML IJ SOLN
4.0000 mg | Freq: Once | INTRAMUSCULAR | Status: AC
  Administered 2015-10-10: 4 mg via INTRAVENOUS
  Filled 2015-10-10: qty 2

## 2015-10-10 MED ORDER — SODIUM CHLORIDE 0.9 % IV BOLUS (SEPSIS)
1000.0000 mL | INTRAVENOUS | Status: AC
  Administered 2015-10-10 (×2): 1000 mL via INTRAVENOUS

## 2015-10-10 MED ORDER — VANCOMYCIN HCL 10 G IV SOLR: 1500.0000 mg | Freq: Two times a day (BID) | INTRAVENOUS | Status: DC

## 2015-10-10 MED ORDER — DEXTROSE 5 % IV SOLN
2.0000 g | Freq: Once | INTRAVENOUS | Status: AC
  Administered 2015-10-10: 2 g via INTRAVENOUS
  Filled 2015-10-10: qty 2

## 2015-10-10 MED ORDER — VANCOMYCIN HCL IN DEXTROSE 1-5 GM/200ML-% IV SOLN
1000.0000 mg | Freq: Once | INTRAVENOUS | Status: DC
  Filled 2015-10-10: qty 200

## 2015-10-10 MED ORDER — SODIUM CHLORIDE 0.9 % IV SOLN
1500.0000 mg | Freq: Once | INTRAVENOUS | Status: DC
  Filled 2015-10-10: qty 1500

## 2015-10-10 MED ORDER — METRONIDAZOLE IN NACL 5-0.79 MG/ML-% IV SOLN
500.0000 mg | Freq: Once | INTRAVENOUS | Status: DC
  Filled 2015-10-10: qty 100

## 2015-10-10 MED ORDER — METRONIDAZOLE IN NACL 5-0.79 MG/ML-% IV SOLN: 500.0000 mg | Freq: Three times a day (TID) | INTRAVENOUS | Status: DC

## 2015-10-10 MED ORDER — AZTREONAM 1 G IJ SOLR
1.0000 g | Freq: Three times a day (TID) | INTRAMUSCULAR | Status: DC
  Filled 2015-10-10: qty 1

## 2015-10-10 NOTE — ED Provider Notes (Signed)
CSN: [id number]     Arrival date & time 10/10/15  0112 History   First MD Initiated Contact with Patient 10/10/15 0229     Chief Complaint  Patient presents with  . Wound Infection     (Consider location/radiation/quality/duration/timing/severity/associated sxs/prior Treatment) The history is provided by the patient and medical records. No language interpreter was used.    Travis Callahan is a 24 y.o. male  with a hx of MRSA infection, peritonitis, gastric ulcer presents to the Emergency Department complaining of gradual, persistent, progressively worsening swelling, redness and pain to the right upper arm onset 3 days ago.  Patient reports the site started as a small pimple and within 24 hours became large. He reports by Friday morning the wound was draining copious amounts of drainage.  Patient reports associated nausea and vomiting of stomach contents.  Patient reports low-grade fevers at home to 100.0. No treatments prior to arrival. Patient denies current or previous IV drug use.  Nothing makes the symptoms better or worse.  Pt denieheadache, neck pain, neck stiffness, shortness of breath, abdominal pain, dysuria, hematuria, syncope.   Past Medical History  Diagnosis Date  . MRSA (methicillin resistant Staphylococcus aureus)   . Peritonitis ([place])   . Gastric ulcer    Past Surgical History  Procedure Laterality Date  . Gastric ulcer perforation      repair    No family history on file. Social History  Substance Use Topics  . Smoking status: Current Every Day Smoker -- 0.50 packs/day    Types: Cigarettes  . Smokeless tobacco: None  . Alcohol Use: Yes     Comment: socially    Review of Systems  Constitutional: Positive for fever and fatigue. Negative for diaphoresis, appetite change and unexpected weight change.  HENT: Negative for mouth sores.   Eyes: Negative for visual disturbance.  Respiratory: Negative for cough, chest tightness, shortness of breath and wheezing.    Cardiovascular: Negative for chest pain.  Gastrointestinal: Negative for nausea, vomiting, abdominal pain, diarrhea and constipation.  Endocrine: Negative for polydipsia, polyphagia and polyuria.  Genitourinary: Negative for dysuria, urgency, frequency and hematuria.  Musculoskeletal: Negative for back pain and neck stiffness.  Skin: Positive for color change and wound. Negative for rash.  Allergic/Immunologic: Negative for immunocompromised state.  Neurological: Negative for syncope, light-headedness and headaches.  Hematological: Does not bruise/bleed easily.  Psychiatric/Behavioral: Negative for sleep disturbance. The patient is not nervous/anxious.       Allergies  Penicillins; Morphine and related; and Nsaids  Home Medications   Prior to Admission medications   Medication Sig Start Date End Date Taking? Authorizing Provider  acetaminophen (TYLENOL) 500 MG tablet Take 1,000 mg by mouth every 6 (six) hours as needed for moderate pain.   Yes Historical Provider, MD  ondansetron (ZOFRAN-ODT) 4 MG disintegrating tablet Take 4 mg by mouth every 8 (eight) hours as needed for nausea or vomiting.   Yes Historical Provider, MD  pantoprazole (PROTONIX) 40 MG tablet Take 40 mg by mouth 2 (two) times daily.   Yes Historical Provider, MD  promethazine (PHENERGAN) 12.5 MG tablet Take 12.5 mg by mouth every 6 (six) hours as needed for nausea or vomiting.   Yes Historical Provider, MD  promethazine (PHENERGAN) 25 MG suppository Place 25 mg rectally every 6 (six) hours as needed for nausea or vomiting.   Yes Historical Provider, MD   BP 136/84 mmHg  Pulse 118  Temp(Src) 98.6 F (37 C) (Oral)  Resp 22  Ht 5\' 9"  (1.753  m)  Wt 160 lb (72.576 kg)  BMI 23.62 kg/m2  SpO2 98% Physical Exam  Constitutional: He appears well-developed and well-nourished. No distress.  Awake, alert, nontoxic appearance  HENT:  Head: Normocephalic and atraumatic.  Mouth/Throat: Oropharynx is clear and moist. No  oropharyngeal exudate.  Eyes: Conjunctivae are normal. No scleral icterus.  Neck: Normal range of motion. Neck supple.  Cardiovascular: Regular rhythm, normal heart sounds and intact distal pulses.  Tachycardia present.   Pulses:      Radial pulses are 2+ on the right side, and 2+ on the left side.       Dorsalis pedis pulses are 2+ on the right side, and 2+ on the left side.  Pulmonary/Chest: Effort normal and breath sounds normal. No respiratory distress. He has no wheezes.  Equal chest expansion  Abdominal: Soft. Bowel sounds are normal. He exhibits no mass. There is no tenderness. There is no rebound and no guarding.  Musculoskeletal: Normal range of motion. He exhibits no edema.  Full range of motion of the right shoulder and right elbow  Neurological: He is alert.  Speech is clear and goal oriented Moves extremities without ataxia  Skin: Skin is warm and dry. He is not diaphoretic. There is erythema.  Large open wound just proximal to the right decubital fossa with surrounding erythema and induration. Streaking of the erythema down into the forearm and just distal to the axilla Old track marks noted in the bilateral arms and hands  Psychiatric: He has a normal mood and affect.  Nursing note and vitals reviewed.   ED Course  Procedures (including critical care time) Labs Review Labs Reviewed  CBC WITH DIFFERENTIAL/PLATELET - Abnormal; Notable for the following:    RBC 4.00 (*)    Hemoglobin 11.7 (*)    HCT 34.3 (*)    Platelets 429 (*)    All other components within normal limits  CULTURE, BLOOD (ROUTINE X 2)  CULTURE, BLOOD (ROUTINE X 2)  WOUND CULTURE  URINE CULTURE  COMPREHENSIVE METABOLIC PANEL  URINALYSIS, ROUTINE W REFLEX MICROSCOPIC (NOT AT [facility])  URINE RAPID DRUG SCREEN, HOSP PERFORMED  ETHANOL  I-STAT CG4 LACTIC ACID, ED    Imaging Review No results found. I have personally reviewed and evaluated these images and lab results as part of my medical  decision-making.   EKG Interpretation None      MDM   Final diagnoses:  Cellulitis and abscess  Tachycardia   Travis Callahan presents with a large cellulitis and open and draining abscess to the right upper arm. Patient adamantly denies current or previous IV drug use however on exam patient with old track marks noted to the bilateral upper extremities.   Patient is afebrile but tachycardic on arrival.    patient history of MRSA. Believe patient has the same today. Erythema and induration extend to the majority of the upper extremity. Significant concern for progressing cellulitis. Blood cultures obtained. Patient given Azactam and vancomycin as he is allergic to penicillin.  Patient needed admission for further management.  No I&D in the emergency department as wound is already draining copious amounts of purulent fluid.  UDS pending.  Pt admitted to [facility] by Dr. Arnoldo Morale.    BP 136/84 mmHg  Pulse 118  Temp(Src) 98.6 F (37 C) (Oral)  Resp 22  Ht 5\' 9"  (1.753 m)  Wt 160 lb (72.576 kg)  BMI 23.62 kg/m2  SpO2 98%   Travis Butts, PA-C 10/10/15 0521  6:14 AM Patient has now decided  that he will leave the department AMA. He reports he is known to take care of his mother at home.  We discussed the nature and purpose, risks and benefits, as well as, the alternatives of treatment. Time was given to allow the opportunity to ask questions and consider their options, and after the discussion, the patient decided to refuse the offerred treatment of admission. The patient was informed that refusal could lead to, but was not limited to, death, permanent disability, or severe pain. If present, I asked the relatives or significant others to dissuade them without success. Prior to refusing, I determined that the patient had the capacity to make their decision and understood the consequences of that decision. After refusal, I made every reasonable opportunity to treat them to the best of  my ability.  The patient was notified that they may return to the emergency department at any time for further treatment.  Pt reports he will try to return this afternoon.    BP 124/78 mmHg  Pulse 104  Temp(Src) 98.6 F (37 C) (Oral)  Resp 18  Ht 5\' 9"  (1.753 m)  Wt 160 lb (72.576 kg)  BMI 23.62 kg/m2  SpO2 98%     Travis Butts, PA-C 10/10/15 [place], DO 10/10/15 [phone]

## 2015-10-10 NOTE — ED Notes (Signed)
Pt states that he began to have a "pimple looking area" to his right upper arm 36 hrs ago; pt states that it progressively got larger and his upper arm became more swollen; pt states that he squeezed the area approx 12 hrs ago and states that there was copious amount of purulent drainage that came from the area; pt with redness to rt upper arm and upon dressing being removed pt has a quarter sized open area to rt upper arm with foul smelling yellow purulent drainage noted to the area; pt also c/o N/V over the last few hours and "Just not feeling well"; pt states that he has bilateral rib pain worse with deep inhaltation

## 2015-10-10 NOTE — Progress Notes (Signed)
ANTIBIOTIC CONSULT NOTE - INITIAL  Pharmacy Consult for Vancomycin, Aztreonam, Metronidazole Indication: Cellulitis  Allergies  Allergen Reactions  . Penicillins Anaphylaxis  . Morphine And Related Hives and Itching  . Nsaids Other (See Comments)    ulcers    Patient Measurements: Height: 5\' 9"  (175.3 cm) Weight: 160 lb (72.576 kg) IBW/kg (Calculated) : 70.7  Vital Signs: Temp: 98.6 F (37 C) (11/19 0137) Temp Source: Oral (11/19 0137) BP: 136/84 mmHg (11/19 0137) Pulse Rate: 118 (11/19 0137) Intake/Output from previous day:   Intake/Output from this shift:    Labs:  Recent Labs  10/10/15 0340  WBC 9.6  HGB 11.7*  PLT 429*  CREATININE 0.87   Estimated Creatinine Clearance: 129.8 mL/min (by C-G formula based on Cr of 0.87). No results for input(s): VANCOTROUGH, VANCOPEAK, VANCORANDOM, GENTTROUGH, GENTPEAK, GENTRANDOM, TOBRATROUGH, TOBRAPEAK, TOBRARND, AMIKACINPEAK, AMIKACINTROU, AMIKACIN in the last 72 hours.   Microbiology: No results found for this or any previous visit (from the past 720 hour(s)).  Medical History: Past Medical History  Diagnosis Date  . MRSA (methicillin resistant Staphylococcus aureus)   . Peritonitis ([place])   . Gastric ulcer     Medications:  Scheduled:   Infusions:  . aztreonam 2 g (10/10/15 0512)  . metronidazole    . metronidazole    . sodium chloride 1,000 mL (10/10/15 0512)   Followed by  . sodium chloride    . vancomycin    . vancomycin     Assessment:  24 yr male with h/o MRSA, peritonitis, gastric ulcer comes to ED with right upper arm swelling, redness and pain.  Reports wound has been draining.  Streaking of erythema noted down into the forearm.  Pharmacy consulted to dose Vancomycin, Aztreonam and Metronidazole for treatment of cellulitis  11/19 >>Vanc >> 11/19 >>Aztreonam >>  11/19 >> Metronidazole >>   11/19 blood: 11/19 urine: 11/19 wound :  Trough/Dose change info:   Goal of Therapy:  Vancomycin  trough level 10-15 mcg/ml  Plan:  Measure antibiotic drug levels at steady state Follow up culture results  Vancomycin 1500mg  IV q12h Metronidazole 500mg  IV q8h Aztreonam 2gm IV x 1 then 1gm IV q8h  Symphonie Schneiderman, Toribio Harbour, PharmD 10/10/2015,5:23 AM

## 2015-10-11 ENCOUNTER — Inpatient Hospital Stay (HOSPITAL_COMMUNITY): Payer: Self-pay | Admitting: Anesthesiology

## 2015-10-11 ENCOUNTER — Encounter (HOSPITAL_COMMUNITY)
Admission: EM | Disposition: A | Discharge status: Home / Self Care | Payer: Self-pay | Source: Home / Self Care | Attending: Internal Medicine

## 2015-10-11 ENCOUNTER — Inpatient Hospital Stay (HOSPITAL_COMMUNITY): Payer: Self-pay

## 2015-10-11 ENCOUNTER — Encounter (HOSPITAL_COMMUNITY): Payer: Self-pay | Admitting: *Deleted

## 2015-10-11 ENCOUNTER — Inpatient Hospital Stay (HOSPITAL_COMMUNITY)
Admission: EM | Admit: 2015-10-11 | Discharge: 2015-10-14 | DRG: 571 | Disposition: A | Discharge status: Home / Self Care | Payer: Self-pay | Attending: Family Medicine | Admitting: Family Medicine

## 2015-10-11 DIAGNOSIS — M795 Residual foreign body in soft tissue: Secondary | ICD-10-CM | POA: Diagnosis present

## 2015-10-11 DIAGNOSIS — G5611 Other lesions of median nerve, right upper limb: Secondary | ICD-10-CM | POA: Diagnosis present

## 2015-10-11 DIAGNOSIS — Z79899 Other long term (current) drug therapy: Secondary | ICD-10-CM

## 2015-10-11 DIAGNOSIS — E872 Acidosis: Secondary | ICD-10-CM | POA: Diagnosis present

## 2015-10-11 DIAGNOSIS — Z880 Allergy status to penicillin: Secondary | ICD-10-CM

## 2015-10-11 DIAGNOSIS — Z885 Allergy status to narcotic agent status: Secondary | ICD-10-CM

## 2015-10-11 DIAGNOSIS — F1910 Other psychoactive substance abuse, uncomplicated: Secondary | ICD-10-CM

## 2015-10-11 DIAGNOSIS — R112 Nausea with vomiting, unspecified: Secondary | ICD-10-CM

## 2015-10-11 DIAGNOSIS — E876 Hypokalemia: Secondary | ICD-10-CM

## 2015-10-11 DIAGNOSIS — K219 Gastro-esophageal reflux disease without esophagitis: Secondary | ICD-10-CM

## 2015-10-11 DIAGNOSIS — L02413 Cutaneous abscess of right upper limb: Principal | ICD-10-CM | POA: Insufficient documentation

## 2015-10-11 DIAGNOSIS — F17210 Nicotine dependence, cigarettes, uncomplicated: Secondary | ICD-10-CM | POA: Diagnosis present

## 2015-10-11 DIAGNOSIS — R1110 Vomiting, unspecified: Secondary | ICD-10-CM | POA: Insufficient documentation

## 2015-10-11 DIAGNOSIS — L02419 Cutaneous abscess of limb, unspecified: Secondary | ICD-10-CM | POA: Diagnosis present

## 2015-10-11 DIAGNOSIS — Z8711 Personal history of peptic ulcer disease: Secondary | ICD-10-CM

## 2015-10-11 DIAGNOSIS — Z8249 Family history of ischemic heart disease and other diseases of the circulatory system: Secondary | ICD-10-CM

## 2015-10-11 DIAGNOSIS — D649 Anemia, unspecified: Secondary | ICD-10-CM

## 2015-10-11 DIAGNOSIS — Z8614 Personal history of Methicillin resistant Staphylococcus aureus infection: Secondary | ICD-10-CM

## 2015-10-11 DIAGNOSIS — G589 Mononeuropathy, unspecified: Secondary | ICD-10-CM | POA: Diagnosis present

## 2015-10-11 DIAGNOSIS — Z886 Allergy status to analgesic agent status: Secondary | ICD-10-CM

## 2015-10-11 DIAGNOSIS — Z720 Tobacco use: Secondary | ICD-10-CM

## 2015-10-11 HISTORY — PX: INCISION AND DRAINAGE OF WOUND: SHX1803

## 2015-10-11 LAB — CBC WITH DIFFERENTIAL/PLATELET
BASOS ABS: 0 10*3/uL (ref 0.0–0.1)
Basophils Relative: 0 %
Eosinophils Absolute: 0.1 10*3/uL (ref 0.0–0.7)
Eosinophils Relative: 1 %
HEMATOCRIT: 36.8 % — AB (ref 39.0–52.0)
HEMOGLOBIN: 12 g/dL — AB (ref 13.0–17.0)
LYMPHS PCT: 31 %
Lymphs Abs: 2.4 10*3/uL (ref 0.7–4.0)
MCH: 28.4 pg (ref 26.0–34.0)
MCHC: 32.6 g/dL (ref 30.0–36.0)
MCV: 87 fL (ref 78.0–100.0)
MONO ABS: 0.6 10*3/uL (ref 0.1–1.0)
MONOS PCT: 8 %
NEUTROS ABS: 4.5 10*3/uL (ref 1.7–7.7)
Neutrophils Relative %: 60 %
Platelets: 461 10*3/uL — ABNORMAL HIGH (ref 150–400)
RBC: 4.23 MIL/uL (ref 4.22–5.81)
RDW: 14.6 % (ref 11.5–15.5)
WBC: 7.6 10*3/uL (ref 4.0–10.5)

## 2015-10-11 LAB — COMPREHENSIVE METABOLIC PANEL
ALBUMIN: 3.6 g/dL (ref 3.5–5.0)
ALT: 23 U/L (ref 17–63)
AST: 25 U/L (ref 15–41)
Alkaline Phosphatase: 82 U/L (ref 38–126)
Anion gap: 12 (ref 5–15)
BUN: 10 mg/dL (ref 6–20)
CHLORIDE: 105 mmol/L (ref 101–111)
CO2: 24 mmol/L (ref 22–32)
Calcium: 9 mg/dL (ref 8.9–10.3)
Creatinine, Ser: 0.74 mg/dL (ref 0.61–1.24)
GFR calc Af Amer: >60 mL/min (ref >60)
GFR calc non Af Amer: >60 mL/min (ref >60)
GLUCOSE: 119 mg/dL — AB (ref 65–99)
POTASSIUM: 3.4 mmol/L — AB (ref 3.5–5.1)
SODIUM: 141 mmol/L (ref 135–145)
Total Bilirubin: 0.4 mg/dL (ref 0.3–1.2)
Total Protein: 7.3 g/dL (ref 6.5–8.1)

## 2015-10-11 LAB — I-STAT CG4 LACTIC ACID, ED: Lactic Acid, Venous: 3.12 mmol/L (ref 0.5–2.0)

## 2015-10-11 LAB — SEDIMENTATION RATE: SED RATE: 19 mm/h — AB (ref 0–16)

## 2015-10-11 LAB — LIPASE, BLOOD: Lipase: 41 U/L (ref 11–51)

## 2015-10-11 SURGERY — IRRIGATION AND DEBRIDEMENT WOUND
Anesthesia: General | Laterality: Right

## 2015-10-11 MED ORDER — POLYETHYLENE GLYCOL 3350 17 G PO PACK: 17.0000 g | PACK | Freq: Every day | ORAL | Status: DC | PRN

## 2015-10-11 MED ORDER — DEXTROSE 5 % IV SOLN
1.0000 g | Freq: Three times a day (TID) | INTRAVENOUS | Status: DC
  Administered 2015-10-12 – 2015-10-14 (×8): 1 g via INTRAVENOUS
  Filled 2015-10-11 (×9): qty 1

## 2015-10-11 MED ORDER — HYDROMORPHONE HCL 2 MG/ML IJ SOLN
INTRAMUSCULAR | Status: AC
  Filled 2015-10-11: qty 1

## 2015-10-11 MED ORDER — ONDANSETRON HCL 4 MG PO TABS
4.0000 mg | ORAL_TABLET | Freq: Four times a day (QID) | ORAL | Status: DC | PRN
Start: 2015-10-11 — End: 2015-10-11

## 2015-10-11 MED ORDER — LACTATED RINGERS IV SOLN: INTRAVENOUS | Status: DC

## 2015-10-11 MED ORDER — FENTANYL CITRATE (PF) 100 MCG/2ML IJ SOLN
INTRAMUSCULAR | Status: AC
  Filled 2015-10-11: qty 2

## 2015-10-11 MED ORDER — VITAMIN C 500 MG PO TABS
1000.0000 mg | ORAL_TABLET | Freq: Every day | ORAL | Status: DC
  Administered 2015-10-12 – 2015-10-14 (×3): 1000 mg via ORAL
  Filled 2015-10-11 (×3): qty 2

## 2015-10-11 MED ORDER — FENTANYL CITRATE (PF) 100 MCG/2ML IJ SOLN
50.0000 ug | INTRAMUSCULAR | Status: DC | PRN
  Administered 2015-10-11 (×2): 50 ug via INTRAVENOUS

## 2015-10-11 MED ORDER — SODIUM CHLORIDE 0.9 % IJ SOLN
INTRAMUSCULAR | Status: AC
  Filled 2015-10-11: qty 10

## 2015-10-11 MED ORDER — POTASSIUM CHLORIDE CRYS ER 20 MEQ PO TBCR
40.0000 meq | EXTENDED_RELEASE_TABLET | Freq: Once | ORAL | Status: AC
  Administered 2015-10-12: 40 meq via ORAL
  Filled 2015-10-11: qty 2

## 2015-10-11 MED ORDER — LIDOCAINE HCL (CARDIAC) 20 MG/ML IV SOLN
INTRAVENOUS | Status: AC
  Filled 2015-10-11: qty 5

## 2015-10-11 MED ORDER — ONDANSETRON HCL 4 MG/2ML IJ SOLN
4.0000 mg | Freq: Once | INTRAMUSCULAR | Status: AC
  Administered 2015-10-11: 4 mg via INTRAVENOUS
  Filled 2015-10-11: qty 2

## 2015-10-11 MED ORDER — HYDROMORPHONE HCL 1 MG/ML IJ SOLN: 0.2500 mg | INTRAMUSCULAR | Status: DC | PRN

## 2015-10-11 MED ORDER — LIDOCAINE HCL (CARDIAC) 20 MG/ML IV SOLN
INTRAVENOUS | Status: DC | PRN
  Administered 2015-10-11: 100 mg via INTRAVENOUS

## 2015-10-11 MED ORDER — PROPOFOL 10 MG/ML IV BOLUS
INTRAVENOUS | Status: AC
  Filled 2015-10-11: qty 20

## 2015-10-11 MED ORDER — METHOCARBAMOL 500 MG PO TABS
500.0000 mg | ORAL_TABLET | Freq: Four times a day (QID) | ORAL | Status: DC | PRN
  Administered 2015-10-12 – 2015-10-13 (×3): 500 mg via ORAL
  Filled 2015-10-11 (×4): qty 1

## 2015-10-11 MED ORDER — PANTOPRAZOLE SODIUM 40 MG PO TBEC
40.0000 mg | DELAYED_RELEASE_TABLET | Freq: Two times a day (BID) | ORAL | Status: DC
  Administered 2015-10-12 – 2015-10-14 (×6): 40 mg via ORAL
  Filled 2015-10-11 (×8): qty 1

## 2015-10-11 MED ORDER — DEXTROSE 5 % IV SOLN
1.0000 g | INTRAVENOUS | Status: AC
  Administered 2015-10-11: 1 g via INTRAVENOUS
  Filled 2015-10-11: qty 1

## 2015-10-11 MED ORDER — VANCOMYCIN HCL 10 G IV SOLR
1500.0000 mg | INTRAVENOUS | Status: AC
  Administered 2015-10-11: 1500 mg via INTRAVENOUS
  Filled 2015-10-11: qty 1500

## 2015-10-11 MED ORDER — FENTANYL CITRATE (PF) 100 MCG/2ML IJ SOLN
INTRAMUSCULAR | Status: AC
  Administered 2015-10-11: 50 ug via INTRAVENOUS
  Filled 2015-10-11: qty 2

## 2015-10-11 MED ORDER — ONDANSETRON HCL 4 MG/2ML IJ SOLN: 4.0000 mg | Freq: Four times a day (QID) | INTRAMUSCULAR | Status: DC | PRN

## 2015-10-11 MED ORDER — HYDROMORPHONE HCL 1 MG/ML IJ SOLN
1.0000 mg | Freq: Once | INTRAMUSCULAR | Status: AC
  Administered 2015-10-11: 1 mg via INTRAVENOUS
  Filled 2015-10-11: qty 1

## 2015-10-11 MED ORDER — FENTANYL CITRATE (PF) 100 MCG/2ML IJ SOLN
50.0000 ug | Freq: Every day | INTRAMUSCULAR | Status: DC | PRN
  Administered 2015-10-11 (×2): 50 ug via INTRAVENOUS

## 2015-10-11 MED ORDER — FENTANYL CITRATE (PF) 100 MCG/2ML IJ SOLN
INTRAMUSCULAR | Status: DC | PRN
  Administered 2015-10-11: 100 ug via INTRAVENOUS

## 2015-10-11 MED ORDER — BISACODYL 10 MG RE SUPP: 10.0000 mg | Freq: Every day | RECTAL | Status: DC | PRN

## 2015-10-11 MED ORDER — ACETAMINOPHEN 650 MG RE SUPP: 650.0000 mg | Freq: Four times a day (QID) | RECTAL | Status: DC | PRN

## 2015-10-11 MED ORDER — ENOXAPARIN SODIUM 40 MG/0.4ML ~~LOC~~ SOLN
40.0000 mg | SUBCUTANEOUS | Status: DC
  Administered 2015-10-12 – 2015-10-13 (×2): 40 mg via SUBCUTANEOUS
  Filled 2015-10-11 (×3): qty 0.4

## 2015-10-11 MED ORDER — FAMOTIDINE 20 MG PO TABS
20.0000 mg | ORAL_TABLET | Freq: Two times a day (BID) | ORAL | Status: DC | PRN
  Filled 2015-10-11: qty 1

## 2015-10-11 MED ORDER — ACETAMINOPHEN 325 MG PO TABS: 650.0000 mg | ORAL_TABLET | Freq: Four times a day (QID) | ORAL | Status: DC | PRN

## 2015-10-11 MED ORDER — DEXTROSE 5 % IV SOLN
500.0000 mg | Freq: Four times a day (QID) | INTRAVENOUS | Status: DC | PRN
  Filled 2015-10-11: qty 5

## 2015-10-11 MED ORDER — VANCOMYCIN HCL 10 G IV SOLR
1500.0000 mg | Freq: Two times a day (BID) | INTRAVENOUS | Status: DC
  Administered 2015-10-12 – 2015-10-14 (×5): 1500 mg via INTRAVENOUS
  Filled 2015-10-11 (×7): qty 1500

## 2015-10-11 MED ORDER — DIPHENHYDRAMINE HCL 25 MG PO CAPS
25.0000 mg | ORAL_CAPSULE | Freq: Four times a day (QID) | ORAL | Status: DC | PRN
  Administered 2015-10-13: 50 mg via ORAL
  Filled 2015-10-11: qty 2

## 2015-10-11 MED ORDER — PROMETHAZINE HCL 25 MG RE SUPP: 12.5000 mg | Freq: Four times a day (QID) | RECTAL | Status: DC | PRN

## 2015-10-11 MED ORDER — PROPOFOL 10 MG/ML IV BOLUS
INTRAVENOUS | Status: DC | PRN
  Administered 2015-10-11: 200 mg via INTRAVENOUS

## 2015-10-11 MED ORDER — OXYCODONE HCL 5 MG PO TABS
5.0000 mg | ORAL_TABLET | ORAL | Status: DC | PRN
  Administered 2015-10-12 (×2): 10 mg via ORAL
  Administered 2015-10-12: 5 mg via ORAL
  Administered 2015-10-13 – 2015-10-14 (×7): 10 mg via ORAL
  Filled 2015-10-11 (×2): qty 2
  Filled 2015-10-11: qty 1
  Filled 2015-10-11 (×2): qty 2
  Filled 2015-10-11: qty 1
  Filled 2015-10-11: qty 2
  Filled 2015-10-11: qty 1
  Filled 2015-10-11 (×3): qty 2

## 2015-10-11 MED ORDER — HYDROMORPHONE HCL 1 MG/ML IJ SOLN
INTRAMUSCULAR | Status: DC | PRN
  Administered 2015-10-11 (×2): 1 mg via INTRAVENOUS
  Administered 2015-10-11: .4 mg via INTRAVENOUS
  Administered 2015-10-11: .6 mg via INTRAVENOUS
  Administered 2015-10-11: .4 mg via INTRAVENOUS
  Administered 2015-10-11: .6 mg via INTRAVENOUS

## 2015-10-11 MED ORDER — HYDROMORPHONE HCL 1 MG/ML IJ SOLN
1.0000 mg | INTRAMUSCULAR | Status: DC | PRN
  Administered 2015-10-12 – 2015-10-14 (×14): 1 mg via INTRAVENOUS
  Filled 2015-10-11 (×16): qty 1

## 2015-10-11 MED ORDER — ONDANSETRON HCL 4 MG/2ML IJ SOLN
INTRAMUSCULAR | Status: DC | PRN
  Administered 2015-10-11: 4 mg via INTRAVENOUS

## 2015-10-11 MED ORDER — LACTATED RINGERS IV SOLN
INTRAVENOUS | Status: DC | PRN
  Administered 2015-10-11: 21:00:00 via INTRAVENOUS

## 2015-10-11 MED ORDER — MAGNESIUM CITRATE PO SOLN: 1.0000 | Freq: Once | ORAL | Status: DC | PRN

## 2015-10-11 MED ORDER — SODIUM CHLORIDE 0.9 % IV BOLUS (SEPSIS)
1000.0000 mL | Freq: Once | INTRAVENOUS | Status: AC
  Administered 2015-10-11: 1000 mL via INTRAVENOUS

## 2015-10-11 MED ORDER — DOCUSATE SODIUM 100 MG PO CAPS
100.0000 mg | ORAL_CAPSULE | Freq: Two times a day (BID) | ORAL | Status: DC
  Administered 2015-10-12 – 2015-10-14 (×5): 100 mg via ORAL
  Filled 2015-10-11 (×8): qty 1

## 2015-10-11 MED ORDER — ACETAMINOPHEN 10 MG/ML IV SOLN
INTRAVENOUS | Status: AC
  Filled 2015-10-11: qty 100

## 2015-10-11 MED ORDER — ONDANSETRON HCL 4 MG PO TABS: 4.0000 mg | ORAL_TABLET | Freq: Four times a day (QID) | ORAL | Status: DC | PRN

## 2015-10-11 MED ORDER — SODIUM CHLORIDE 0.9 % IR SOLN
Status: DC | PRN
  Administered 2015-10-11: 3000 mL

## 2015-10-11 MED ORDER — SENNA 8.6 MG PO TABS
1.0000 | ORAL_TABLET | Freq: Two times a day (BID) | ORAL | Status: DC
  Administered 2015-10-12 – 2015-10-13 (×3): 8.6 mg via ORAL
  Filled 2015-10-11 (×3): qty 1

## 2015-10-11 SURGICAL SUPPLY — 15 items
BANDAGE ELASTIC 4 VELCRO ST LF (GAUZE/BANDAGES/DRESSINGS) ×2 IMPLANT
BNDG GAUZE ELAST 4 BULKY (GAUZE/BANDAGES/DRESSINGS) ×2 IMPLANT
DRSG ADAPTIC 3X8 NADH LF (GAUZE/BANDAGES/DRESSINGS) ×2 IMPLANT
GAUZE SPONGE 4X4 12PLY STRL (GAUZE/BANDAGES/DRESSINGS) ×2 IMPLANT
GAUZE XEROFORM 5X9 LF (GAUZE/BANDAGES/DRESSINGS) ×2 IMPLANT
GLOVE BIO SURGEON STRL SZ7 (GLOVE) ×2 IMPLANT
GLOVE BIO SURGEON STRL SZ8 (GLOVE) ×2 IMPLANT
GOWN STRL REUS W/ TWL XL LVL3 (GOWN DISPOSABLE) ×1 IMPLANT
GOWN STRL REUS W/TWL XL LVL3 (GOWN DISPOSABLE) ×1
KIT BASIN OR (CUSTOM PROCEDURE TRAY) ×2 IMPLANT
PADDING CAST ABS 4INX4YD NS (CAST SUPPLIES) ×1
PADDING CAST ABS COTTON 4X4 ST (CAST SUPPLIES) ×1 IMPLANT
TOWEL OR 17X26 10 PK STRL BLUE (TOWEL DISPOSABLE) ×2 IMPLANT
TOWEL OR NON WOVEN STRL DISP B (DISPOSABLE) ×2 IMPLANT
TUBE ANAEROBIC SPECIMEN COL (MISCELLANEOUS) ×2 IMPLANT

## 2015-10-11 NOTE — Anesthesia Preprocedure Evaluation (Addendum)
Anesthesia Evaluation  Patient identified by MRN, date of birth, ID band Patient awake    Reviewed: Allergy & Precautions, H&P , NPO status , Patient's Chart, lab work & pertinent test results, reviewed documented beta blocker date and time   Airway Mallampati: II  TM Distance: >3 FB Neck ROM: full    Dental  (+) Dental Advisory Given, Chipped Chip left upper front:   Pulmonary Current Smoker,    Pulmonary exam normal breath sounds clear to auscultation       Cardiovascular Exercise Tolerance: Good negative cardio ROS Normal cardiovascular exam Rhythm:regular Rate:Normal     Neuro/Psych negative neurological ROS  negative psych ROS   GI/Hepatic negative GI ROS, GERD  Medicated and Controlled,(+)     substance abuse  IV drug use, Polysubstance abuse   Endo/Other  negative endocrine ROS  Renal/GU negative Renal ROS  negative genitourinary   Musculoskeletal   Abdominal   Peds  Hematology negative hematology ROS (+)   Anesthesia Other Findings   Reproductive/Obstetrics negative OB ROS                            Anesthesia Physical Anesthesia Plan  ASA: III  Anesthesia Plan: General   Post-op Pain Management:    Induction: Intravenous  Airway Management Planned: LMA  Additional Equipment:   Intra-op Plan:   Post-operative Plan:   Informed Consent: I have reviewed the patients History and Physical, chart, labs and discussed the procedure including the risks, benefits and alternatives for the proposed anesthesia with the patient or authorized representative who has indicated his/her understanding and acceptance.   Dental Advisory Given  Plan Discussed with: CRNA and Surgeon  Anesthesia Plan Comments:         Anesthesia Quick Evaluation

## 2015-10-11 NOTE — Consult Note (Signed)
Reason for Consult: Abscess right upper arm Referring Physician: Hospitalist M.D.  Abigail Butts is an 24 y.o. male.  HPI: Patient is 24 year old male with progressive history of inflammation cellulitis and infection in his right upper arm. The a patient states this been present for days. There is a foul-smelling area of necrosis in his arm. There is no evidence of compartment syndrome but he states that this has progressively gotten worse.  he denies IV drug abuse. He denies trauma or injury. 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  He denies neck back chest or abdominal pain.  He denies other injury.  He was admitted to the hospitalist service and I was asked to consult.   Past Medical History  Diagnosis Date  . MRSA (methicillin resistant Staphylococcus aureus)   . Peritonitis ([place])   . Gastric ulcer     Past Surgical History  Procedure Laterality Date  . Gastric ulcer perforation      repair     No family history on file.  Social History:  reports that he has been smoking Cigarettes.  He has been smoking about 0.50 packs per day. He does not have any smokeless tobacco history on file. He reports that he drinks alcohol. He reports that he uses illicit drugs (Marijuana).  Allergies:  Allergies  Allergen Reactions  . Penicillins Anaphylaxis    Has patient had a PCN reaction causing immediate rash, facial/tongue/throat swelling, SOB or lightheadedness with hypotension: Unknown Has patient had a PCN reaction causing severe rash involving mucus membranes or skin necrosis: Unknown Has patient had a PCN reaction that required hospitalization: already in hospital when found out   Has patient had a PCN reaction occurring within the last 10 years: No  If all of the above answers are "NO", then may proceed with Cephalosporin use.   Marland Kitchen Morphine And Related Hives and Itching  . Nsaids  Other (See Comments)    ulcers    Medications: I have reviewed the patient's current medications.  Results for orders placed or performed during the hospital encounter of 10/11/15 (from the past 48 hour(s))  Comprehensive metabolic panel     Status: Abnormal   Collection Time: 10/11/15  4:30 PM  Result Value Ref Range   Sodium 141 135 - 145 mmol/L   Potassium 3.4 (L) 3.5 - 5.1 mmol/L   Chloride 105 101 - 111 mmol/L   CO2 24 22 - 32 mmol/L   Glucose, Bld 119 (H) 65 - 99 mg/dL   BUN 10 6 - 20 mg/dL   Creatinine, Ser 0.74 0.61 - 1.24 mg/dL   Calcium 9.0 8.9 - 10.3 mg/dL   Total Protein 7.3 6.5 - 8.1 g/dL   Albumin 3.6 3.5 - 5.0 g/dL   AST 25 15 - 41 U/L   ALT 23 17 - 63 U/L   Alkaline Phosphatase 82 38 - 126 U/L   Total Bilirubin 0.4 0.3 - 1.2 mg/dL   GFR calc non Af Amer >60 >60 mL/min   GFR calc Af Amer >60 >60 mL/min    Comment: (NOTE) The eGFR has been calculated using the CKD EPI equation. This calculation has not been validated in all clinical situations. eGFR's persistently <60 mL/min signify possible Chronic Kidney Disease.    Anion gap 12 5 - 15  Lipase, blood     Status: None   Collection Time: 10/11/15  4:30 PM  Result Value Ref Range   Lipase 41 11 - 51 U/L  CBC with Differential     Status: Abnormal   Collection Time: 10/11/15  4:30 PM  Result Value Ref Range   WBC 7.6 4.0 - 10.5 K/uL   RBC 4.23 4.22 - 5.81 MIL/uL   Hemoglobin 12.0 (L) 13.0 - 17.0 g/dL   HCT 36.8 (L) 39.0 - 52.0 %   MCV 87.0 78.0 - 100.0 fL   MCH 28.4 26.0 - 34.0 pg   MCHC 32.6 30.0 - 36.0 g/dL   RDW 14.6 11.5 - 15.5 %   Platelets 461 (H) 150 - 400 K/uL   Neutrophils Relative % 60 %   Neutro Abs 4.5 1.7 - 7.7 K/uL   Lymphocytes Relative 31 %   Lymphs Abs 2.4 0.7 - 4.0 K/uL   Monocytes Relative 8 %   Monocytes Absolute 0.6 0.1 - 1.0 K/uL   Eosinophils Relative 1 %   Eosinophils Absolute 0.1 0.0 - 0.7 K/uL   Basophils Relative 0 %   Basophils Absolute 0.0 0.0 - 0.1 K/uL  I-Stat CG4  Lactic Acid, ED     Status: Abnormal   Collection Time: 10/11/15  4:41 PM  Result Value Ref Range   Lactic Acid, Venous 3.12 (HH) 0.5 - 2.0 mmol/L   Comment NOTIFIED PHYSICIAN     No results found.  Review of Systems  Constitutional: Negative.   HENT: Negative.   Respiratory: Negative.   Cardiovascular: Negative.   Gastrointestinal:       History of ulcers requiring surgical avenues of care secondary to perforation  Neurological: Negative.   Endo/Heme/Allergies: Negative.   Psychiatric/Behavioral: Negative.    Blood pressure 131/94, pulse 85, temperature 98.9 F (37.2 C), temperature source Oral, resp. rate 16, SpO2 99 %. Physical Exam Abscess right upper arm without smelling tissue and necrosis. No evidence of frank compartment syndrome but his muscle is quite tight. He is neurovascularly intact but complains of some occasional tingling in the hand that is begun recently.  The patient is alert and oriented in no acute distress. The patient complains of pain in the affected upper extremity.  The patient is noted to have a normal HEENT exam. Lung fields show equal chest expansion and no shortness of breath. Abdomen exam is nontender without distention. He has a well-healed midline incision Lower extremity examination does not show any fracture dislocation or blood clot symptoms. Pelvis is stable and the neck and back are stable and nontender.        Assessment/Plan: Abscess right upper arm. I feel this would do poorly in his current state of affairs. I recommend formal irrigation and debridement and remove any infectious tissue. Following this he will need to undergo hydrotherapy and teaching of wet-to-dry dressing changes.  He agrees with this plan. All questions have been encouraged and answered. We'll proceed to the operative theater as soon as possible. We are planning surgery for your upper extremity. The risk and benefits of surgery to include risk of bleeding,  infection, anesthesia,  damage to normal structures and failure of the surgery to accomplish its intended goals of relieving symptoms and restoring function have been discussed in detail. With this in mind we plan to proceed. I have specifically discussed with the patient the pre-and postoperative regime and the dos and don'ts and risk and benefits in great detail. Risk and benefits of surgery also include risk of dystrophy(CRPS), chronic nerve pain, failure of the healing process to go onto completion  and other inherent risks of surgery The relavent the pathophysiology of the disease/injury process, as well as the alternatives for treatment and postoperative course of action has been discussed in great detail with the patient who desires to proceed.  We will do everything in our power to help you (the patient) restore function to the upper extremity. It is a pleasure to see this patient today.  Paulene Floor 10/11/2015, 7:49 PM

## 2015-10-11 NOTE — Anesthesia Procedure Notes (Signed)
Procedure Name: LMA Insertion Date/Time: 10/11/2015 8:57 PM Performed by: Danley Danker L Patient Re-evaluated:Patient Re-evaluated prior to inductionOxygen Delivery Method: Circle system utilized Preoxygenation: Pre-oxygenation with 100% oxygen Intubation Type: IV induction LMA: LMA inserted LMA Size: 4.0 Number of attempts: 1 Tube secured with: Tape Dental Injury: Teeth and Oropharynx as per pre-operative assessment

## 2015-10-11 NOTE — ED Notes (Signed)
Pt returns after being seen here on the 19th for wound on rt arm, states it is getting worse, now having abd pain and throwing up blood, (Has been throwing up today every time he eats or drinks)

## 2015-10-11 NOTE — ED Notes (Signed)
The hospitalist has just called to let us know she has spoken with Dr. Amedeo Plenty, who requests we keep pt. Here in E.D. Until he sees pt.

## 2015-10-11 NOTE — H&P (Signed)
[facility] History and Physical  Travis Callahan TDD:[id number] DOB: 1990-12-19 DOA: 10/11/2015  Referring physician: Dr. Davonna Belling, [place] PCP: No primary care provider on file.  Specialists:   Chief Complaint: right arm wound  HPI: Travis Callahan is a 24 y.o. male with a history of gastric ulcer perforation, GERD, tobacco abuse who presented to the emergency department with complaints of right arm abscess.  Patient had presented to the ER on 10/10/2015 and was supposed to be admitted at that time however refuse admission due to priorities at home.  It seems that patient had noticed a pimple type area on his right arm couple of days ago. He stated that had a whitehead type of appearance. Over the next 24 hours, he had grown very large was very red and painful. Currently he complains of throbbing pain. Patient states he taken a hot shower and then the abscess started draining. At that point he decided to push on it and noticed pus coming out. Patient did not have IND as the wound was already draining at that time. Patient was given vancomycin and aztreonam as he does have an allergy to penicillin. Patient does have a history of MRSA. In the emergency department, patient was noted to have a left elevated lactic acid. TR[facility] called for admission.  Review of Systems:  Constitutional: Denies fever, chills, diaphoresis, appetite change and fatigue.  HEENT: Denies photophobia, eye pain, redness, hearing loss, ear pain, congestion, sore throat, rhinorrhea, sneezing, mouth sores, trouble swallowing, neck pain, neck stiffness and tinnitus.   Respiratory: Denies SOB, DOE, cough, chest tightness,  and wheezing.   Cardiovascular: Denies chest pain, palpitations and leg swelling.  Gastrointestinal: Complains of nausea. Genitourinary: Denies dysuria, urgency, frequency, hematuria, flank pain and difficulty urinating.  Musculoskeletal: Denies myalgias, back pain, joint swelling, arthralgias  and gait problem.  Skin: Complains of right arm abscess Neurological: Denies dizziness, seizures, syncope, weakness, light-headedness, numbness and headaches.  Hematological: Denies adenopathy. Easy bruising, personal or family bleeding history  Psychiatric/Behavioral: Denies suicidal ideation, mood changes, confusion, nervousness, sleep disturbance and agitation  Past Medical History  Diagnosis Date  . MRSA (methicillin resistant Staphylococcus aureus)   . Peritonitis ([place])   . Gastric ulcer    Past Surgical History  Procedure Laterality Date  . Gastric ulcer perforation      repair    Social History:  reports that he has been smoking Cigarettes.  He has been smoking about 0.50 packs per day. He does not have any smokeless tobacco history on file. He reports that he drinks alcohol. He reports that he uses illicit drugs (Marijuana).   Allergies  Allergen Reactions  . Penicillins Anaphylaxis    Has patient had a PCN reaction causing immediate rash, facial/tongue/throat swelling, SOB or lightheadedness with hypotension: Unknown Has patient had a PCN reaction causing severe rash involving mucus membranes or skin necrosis: Unknown Has patient had a PCN reaction that required hospitalization: already in hospital when found out   Has patient had a PCN reaction occurring within the last 10 years: No  If all of the above answers are "NO", then may proceed with Cephalosporin use.   Marland Kitchen Morphine And Related Hives and Itching  . Nsaids Other (See Comments)    ulcers   Family history Mother has CHF.  Prior to Admission medications   Medication Sig Start Date End Date Taking? Authorizing Provider  acetaminophen (TYLENOL) 500 MG tablet Take 1,000 mg by mouth every 6 (six) hours as needed for moderate pain.  Yes Historical Provider, MD  ondansetron (ZOFRAN-ODT) 4 MG disintegrating tablet Take 4 mg by mouth every 8 (eight) hours as needed for nausea or vomiting.   Yes Historical Provider, MD    pantoprazole (PROTONIX) 40 MG tablet Take 40 mg by mouth 2 (two) times daily.   Yes Historical Provider, MD  promethazine (PHENERGAN) 25 MG suppository Place 25 mg rectally every 6 (six) hours as needed for nausea or vomiting.   Yes Historical Provider, MD  promethazine (PHENERGAN) 12.5 MG tablet Take 12.5 mg by mouth every 6 (six) hours as needed for nausea or vomiting.    Historical Provider, MD   Physical Exam: Filed Vitals:   10/11/15 1540  BP: 146/83  Pulse: 97  Temp: 98.9 F (37.2 C)  Resp: 20     General: Well developed, well nourished, NAD, appears stated age  HEENT: NCAT, PERRLA, EOMI, Anicteic Sclera, mucous membranes moist.   Neck: Supple, no JVD, no masses  Cardiovascular: S1 S2 auscultated, no rubs, murmurs or gallops. Regular rate and rhythm.  Respiratory: Clear to auscultation bilaterally with equal chest rise  Abdomen: Soft, nontender, nondistended, + bowel sounds  Extremities: warm dry without cyanosis clubbing or edema  Neuro: AAOx3, cranial nerves grossly intact. Strength 5/5 in patient's upper and lower extremities bilaterally  Skin: Right distal/medial arm abscess/ulcer with surrounding erythema and mild edema.  Multiple tattoos  Psych: Normal affect and demeanor with intact judgement and insight  Labs on Admission:  Basic Metabolic Panel:  Recent Labs Lab 10/10/15 0340 10/11/15 1630  NA 141 141  K 4.0 3.4*  CL 107 105  CO2 22 24  GLUCOSE 85 119*  BUN 14 10  CREATININE 0.87 0.74  CALCIUM 9.6 9.0   Liver Function Tests:  Recent Labs Lab 10/10/15 0340 10/11/15 1630  AST 30 25  ALT 24 23  ALKPHOS 79 82  BILITOT 0.6 0.4  PROT 7.7 7.3  ALBUMIN 3.9 3.6    Recent Labs Lab 10/11/15 1630  LIPASE 41   No results for input(s): AMMONIA in the last 168 hours. CBC:  Recent Labs Lab 10/10/15 0340 10/11/15 1630  WBC 9.6 7.6  NEUTROABS 6.1 4.5  HGB 11.7* 12.0*  HCT 34.3* 36.8*  MCV 85.8 87.0  PLT 429* 461*   Cardiac  Enzymes: No results for input(s): CKTOTAL, CKMB, CKMBINDEX, TROPONINI in the last 168 hours.  BNP (last 3 results) No results for input(s): BNP in the last 8760 hours.  ProBNP (last 3 results) No results for input(s): PROBNP in the last 8760 hours.  CBG: No results for input(s): GLUCAP in the last 168 hours.  Radiological Exams on Admission: No results found.  EKG: None  Assessment/Plan Right arm abscess/wound -Patient will be admitted to medical floor -Will start the patient on aztreonam (PCN allergy) and vancomycin per pharmacy -Hand surgery consulted and appreciated, Dr. Amedeo Plenty will the patient in the ER -Continue pain control -Will obtain ESR and CRP  Hypokalemia -Will replace potasstium  Nausea -Possibly related to GERD vs abscess although patient does not have leukocytosis and is afebrile -Antiemetics as needed  GERD/Gastric Ulcer -Continue PPI  Lactic acidosis -3.12 upon admission -Likely secondary to the abscess, will give IV fluids and continue to monitor  Normocytic anemia -Hemoglobin currently 12, continue to monitor CBC  Polysubstance abuse/ tobacco abuse -Patient does admit to using marijuana and smoking cigarettes -Patient counseled on advised to stop -Will obtain UDS  DVT prophylaxis: Lovenox  Code Status: Full  Condition: Guarded  Family Communication:  None at bedside. Admission, patients condition and plan of care including tests being ordered have been discussed with the patient, who indicates understanding and agrees with the plan and Code Status.  Disposition Plan: Admitted  Time spent: 60 minutes  Jordyne Poehlman D.O. [facility] Pager [phone]  If 7PM-7AM, please contact night-coverage [email or web address] Password [facility] 10/11/2015, 5:56 PM

## 2015-10-11 NOTE — Transfer of Care (Signed)
Immediate Anesthesia Transfer of Care Note  Patient: Travis Callahan  Procedure(s) Performed: Procedure(s): IRRIGATION AND DEBRIDEMENT right upper arm (Right)  Patient Location: PACU  Anesthesia Type:General  Level of Consciousness: awake and alert   Airway & Oxygen Therapy: Patient Spontanous Breathing and Patient connected to nasal cannula oxygen  Post-op Assessment: Report given to RN and Post -op Vital signs reviewed and stable  Post vital signs: Reviewed and stable  Last Vitals:  Filed Vitals:   10/11/15 1810 10/11/15 2221  BP: 131/94   Pulse: 85   Temp:  36.9 C  Resp: 16     Complications: No apparent anesthesia complications

## 2015-10-11 NOTE — ED Notes (Addendum)
Patient given CHG wipes and asked to change into gown.  Patient reports his mother will arrive to hospital in approx 30 min.  Consent signed by patient and witnessed by this RN.    Patient reported belongings of plastic watch, two metal necklaces of minimal value, wallet with debit card - patient denies having cash, socks, shoes, pants, shirt.  Patient declined to have belongings locked with security.  OR staff notified.

## 2015-10-11 NOTE — Op Note (Signed)
See [id number] Amedeo Plenty MD

## 2015-10-11 NOTE — ED Notes (Signed)
Dr. Alvino Chapel aware of pt's lactic acid of 3.12

## 2015-10-11 NOTE — ED Provider Notes (Signed)
CSN: [id number]     Arrival date & time 10/11/15  1521 History   First MD Initiated Contact with Patient 10/11/15 1553     Chief Complaint  Patient presents with  . Abscess     (Consider location/radiation/quality/duration/timing/severity/associated sxs/prior Treatment) Patient is a 24 y.o. male presenting with abscess. The history is provided by the patient.  Abscess Associated symptoms: vomiting   Associated symptoms: no fever   patient returns for admission. He was seen in the ED 2 days ago for right upper arm abscess. He would not be admitted at that time. He has has some nausea and vomiting that has also gotten worse. No fevers. Also some diarrhea. Upper abdominal pain. Dull/ Previous perforated gastric ulcer with peritonitis and exlap. Denies previous IVDA. Also has had MRSA in the past. No fevers. He states that he can be admitted now and that the wound has gotten worse.  Past Medical History  Diagnosis Date  . MRSA (methicillin resistant Staphylococcus aureus)   . Peritonitis ([place])   . Gastric ulcer    Past Surgical History  Procedure Laterality Date  . Gastric ulcer perforation      repair    History reviewed. No pertinent family history. Social History  Substance Use Topics  . Smoking status: Current Every Day Smoker -- 0.50 packs/day    Types: Cigarettes  . Smokeless tobacco: None  . Alcohol Use: Yes     Comment: socially    Review of Systems  Constitutional: Negative for fever and appetite change.  Respiratory: Negative for shortness of breath.   Cardiovascular: Negative for chest pain.  Gastrointestinal: Positive for vomiting, abdominal pain and diarrhea.  Genitourinary: Negative for flank pain.  Musculoskeletal: Negative for joint swelling.  Skin: Positive for wound.  Neurological: Negative for numbness.  Psychiatric/Behavioral: Negative for confusion.      Allergies  Penicillins; Morphine and related; and Nsaids  Home Medications   Prior to  Admission medications   Medication Sig Start Date End Date Taking? Authorizing Provider  acetaminophen (TYLENOL) 500 MG tablet Take 1,000 mg by mouth every 6 (six) hours as needed for moderate pain.   Yes Historical Provider, MD  ondansetron (ZOFRAN-ODT) 4 MG disintegrating tablet Take 4 mg by mouth every 8 (eight) hours as needed for nausea or vomiting.   Yes Historical Provider, MD  pantoprazole (PROTONIX) 40 MG tablet Take 40 mg by mouth 2 (two) times daily.   Yes Historical Provider, MD  promethazine (PHENERGAN) 25 MG suppository Place 25 mg rectally every 6 (six) hours as needed for nausea or vomiting.   Yes Historical Provider, MD  promethazine (PHENERGAN) 12.5 MG tablet Take 12.5 mg by mouth every 6 (six) hours as needed for nausea or vomiting.    Historical Provider, MD   BP 146/90 mmHg  Pulse 80  Temp(Src) 98.3 F (36.8 C) (Oral)  Resp 16  SpO2 99% Physical Exam  Constitutional: He appears well-developed.  Cardiovascular: Normal rate.   Pulmonary/Chest: Effort normal.  Abdominal: There is tenderness. There is no guarding.  Upper abdominal tenderness. Scarf from median exlap.  Neurological: He is alert.  Skin:  Ulcer with drainage and tenderness to distal right upper arm.   NV intact in right hand.     ED Course  Procedures (including critical care time) Labs Review Labs Reviewed  COMPREHENSIVE METABOLIC PANEL - Abnormal; Notable for the following:    Potassium 3.4 (*)    Glucose, Bld 119 (*)    All other components within normal  limits  CBC WITH DIFFERENTIAL/PLATELET - Abnormal; Notable for the following:    Hemoglobin 12.0 (*)    HCT 36.8 (*)    Platelets 461 (*)    All other components within normal limits  SEDIMENTATION RATE - Abnormal; Notable for the following:    Sed Rate 19 (*)    All other components within normal limits  I-STAT CG4 LACTIC ACID, ED - Abnormal; Notable for the following:    Lactic Acid, Venous 3.12 (*)    All other components within  normal limits  ANAEROBIC CULTURE  FUNGUS CULTURE W SMEAR  GRAM STAIN  WOUND CULTURE  LIPASE, BLOOD  C-REACTIVE PROTEIN  URINE RAPID DRUG SCREEN, HOSP PERFORMED  SURGICAL PATHOLOGY    Imaging Review Dg Elbow 2 Views Right  10/11/2015  CLINICAL DATA:  Foreign body in soft tissues. Dr. found a needle in the patient's right arm while performing irrigation and debridement of the wound. The needle was removed. To evaluate for additional foreign bodies. EXAM: RIGHT ELBOW - 2 VIEW COMPARISON:  None. FINDINGS: Single-view obtained of the right elbow. No radiopaque soft tissue foreign bodies are demonstrated. There is a fair amount of gas demonstrated in the soft tissues possibly indicating infection or postoperative changes due to debridement. Gas tracks into the visualized upper arm. Visualized bones appear grossly intact. IMPRESSION: No radiopaque soft tissue foreign bodies identified. Soft tissue gas collections are present. Electronically Signed   By: Lucienne Capers M.D.   On: 10/11/2015 23:20   I have personally reviewed and evaluated these images and lab results as part of my medical decision-making.   EKG Interpretation None      MDM   Final diagnoses:  Abscess of arm, right  Non-intractable vomiting with nausea, vomiting of unspecified type    Patient with abscess/ulcer of right forearm. Refused admission yesterday but will be admitted today. Lactic acid is mildly elevated. Has some abdominal pain but feels better after treatment. Will admit to internal medicine.    Davonna Belling, MD 10/11/15 [phone]

## 2015-10-11 NOTE — Anesthesia Postprocedure Evaluation (Signed)
Anesthesia Post Note  Patient: Travis Callahan  Procedure(s) Performed: Procedure(s) (LRB): IRRIGATION AND DEBRIDEMENT right upper arm (Right)  Patient location during evaluation: PACU Anesthesia Type: General Level of consciousness: awake Pain management: pain level controlled Vital Signs Assessment: post-procedure vital signs reviewed and stable Respiratory status: spontaneous breathing Cardiovascular status: stable Anesthetic complications: no    Last Vitals:  Filed Vitals:   10/11/15 2230 10/11/15 2245  BP: 145/102 141/96  Pulse: 71 85  Temp:    Resp: 13 17    Last Pain:  Filed Vitals:   10/11/15 2247  PainSc: Asleep                 [name]

## 2015-10-11 NOTE — Progress Notes (Signed)
ANTIBIOTIC CONSULT NOTE - INITIAL  Pharmacy Consult for Vancomycin / Aztreonam Indication: Wound infection  Allergies  Allergen Reactions  . Penicillins Anaphylaxis  . Morphine And Related Hives and Itching  . Nsaids Other (See Comments)    ulcers    Patient Measurements:   Adjusted Body Weight:   Vital Signs: Temp: 98.9 F (37.2 C) (11/20 1540) Temp Source: Oral (11/20 1540) BP: 146/83 mmHg (11/20 1540) Pulse Rate: 97 (11/20 1540) Intake/Output from previous day:   Intake/Output from this shift:    Labs:  Recent Labs  10/10/15 0340  WBC 9.6  HGB 11.7*  PLT 429*  CREATININE 0.87   Estimated Creatinine Clearance: 129.8 mL/min (by C-G formula based on Cr of 0.87). No results for input(s): VANCOTROUGH, VANCOPEAK, VANCORANDOM, GENTTROUGH, GENTPEAK, GENTRANDOM, TOBRATROUGH, TOBRAPEAK, TOBRARND, AMIKACINPEAK, AMIKACINTROU, AMIKACIN in the last 72 hours.   Microbiology: Recent Results (from the past 720 hour(s))  Culture, blood (routine x 2)     Status: None (Preliminary result)   Collection Time: 10/10/15  3:27 AM  Result Value Ref Range Status   Specimen Description BLOOD RIGHT HAND  Final   Special Requests BOTTLES DRAWN AEROBIC AND ANAEROBIC 5ML  Final   Culture   Final    NO GROWTH 1 DAY Performed at [facility]    Report Status PENDING  Incomplete  Culture, blood (routine x 2)     Status: None (Preliminary result)   Collection Time: 10/10/15  3:35 AM  Result Value Ref Range Status   Specimen Description BLOOD LEFT HAND  Final   Special Requests BOTTLES DRAWN AEROBIC AND ANAEROBIC 5ML  Final   Culture   Final    NO GROWTH 1 DAY Performed at [facility]    Report Status PENDING  Incomplete  Wound culture     Status: None (Preliminary result)   Collection Time: 10/10/15  3:46 AM  Result Value Ref Range Status   Specimen Description WOUND RIGHT ARM  Final   Special Requests Normal  Final   Gram Stain PENDING  Incomplete   Culture   Final     NO GROWTH 1 DAY Performed at [facility]    Report Status PENDING  Incomplete    Medical History: Past Medical History  Diagnosis Date  . MRSA (methicillin resistant Staphylococcus aureus)   . Peritonitis ([place])   . Gastric ulcer    Assessment: 25 yoM with hx MRSA infection seen in ED 2 days prior for RUE draining abscess and started on IV antibiotics but refused admission, now returns with vomiting and abdominal pain.  Pharmacy consulted to restart Vancomycin and Aztreonam for wound infection. Pt only received aztreonam on 11/19.  Anti-infectives 11/20 >> Vancomycin  >> 11/20 >> Aztreonam  >>    Vitals/Labs WBC: WNL Tm24h: Afebrile Renal: SCr stable, CrCl >100 LA elevated 3.12  Cultures 11/19 wound cx: NGTD 11/19 bldx2: NGTD  Goal of Therapy:  Vancomycin trough level 10-15 mcg/ml  Plan:  Vancomycin 1500mg  IV q12h Aztreonam 1g IV q8h Follow renal fxn, micro data, drug levels as needed, clinical course  Ralene Bathe, PharmD, BCPS 10/11/2015, 5:44 PM  Pager: [phone]

## 2015-10-12 ENCOUNTER — Encounter (HOSPITAL_COMMUNITY): Payer: Self-pay | Admitting: Orthopedic Surgery

## 2015-10-12 LAB — COMPREHENSIVE METABOLIC PANEL
ALBUMIN: 3.2 g/dL — AB (ref 3.5–5.0)
ALK PHOS: 71 U/L (ref 38–126)
ALT: 22 U/L (ref 17–63)
ANION GAP: 9 (ref 5–15)
AST: 21 U/L (ref 15–41)
BUN: 8 mg/dL (ref 6–20)
CALCIUM: 8.9 mg/dL (ref 8.9–10.3)
CO2: 25 mmol/L (ref 22–32)
Chloride: 102 mmol/L (ref 101–111)
Creatinine, Ser: 0.68 mg/dL (ref 0.61–1.24)
GFR calc Af Amer: >60 mL/min (ref >60)
GFR calc non Af Amer: >60 mL/min (ref >60)
Glucose, Bld: 95 mg/dL (ref 65–99)
Potassium: 4.2 mmol/L (ref 3.5–5.1)
SODIUM: 136 mmol/L (ref 135–145)
TOTAL PROTEIN: 6.4 g/dL — AB (ref 6.5–8.1)
Total Bilirubin: 0.6 mg/dL (ref 0.3–1.2)

## 2015-10-12 LAB — RAPID URINE DRUG SCREEN, HOSP PERFORMED
AMPHETAMINES: POSITIVE — AB
BARBITURATES: NOT DETECTED
BENZODIAZEPINES: NOT DETECTED
Cocaine: NOT DETECTED
Opiates: POSITIVE — AB
Tetrahydrocannabinol: POSITIVE — AB

## 2015-10-12 LAB — CBC
HCT: 35.5 % — ABNORMAL LOW (ref 39.0–52.0)
HEMOGLOBIN: 11.6 g/dL — AB (ref 13.0–17.0)
MCH: 28.9 pg (ref 26.0–34.0)
MCHC: 32.7 g/dL (ref 30.0–36.0)
MCV: 88.3 fL (ref 78.0–100.0)
Platelets: 397 10*3/uL (ref 150–400)
RBC: 4.02 MIL/uL — AB (ref 4.22–5.81)
RDW: 14.8 % (ref 11.5–15.5)
WBC: 10.1 10*3/uL (ref 4.0–10.5)

## 2015-10-12 LAB — C-REACTIVE PROTEIN: CRP: 0.7 mg/dL (ref <1.0)

## 2015-10-12 NOTE — Progress Notes (Signed)
[facility]                                                                              Patient Demographics  Travis Callahan, is a 24 y.o. male, DOB - October 17, 1991, LKG:[id number]  Admit date - 10/11/2015   Admitting Physician Cristal Ford, DO  Outpatient Primary MD for the patient is No primary care provider on file.  LOS - 1   Chief Complaint  Patient presents with  . Abscess      HPI on 10/11/2015 Travis Callahan is a 24 y.o. male with a history of gastric ulcer perforation, GERD, tobacco abuse who presented to the emergency department with complaints of right arm abscess. Patient had presented to the ER on 10/10/2015 and was supposed to be admitted at that time however refuse admission due to priorities at home. It seems that patient had noticed a pimple type area on his right arm couple of days ago. He stated that had a whitehead type of appearance. Over the next 24 hours, he had grown very large was very red and painful. Currently he complains of throbbing pain. Patient states he taken a hot shower and then the abscess started draining. At that point he decided to push on it and noticed pus coming out. Patient did not have IND as the wound was already draining at that time. Patient was given vancomycin and aztreonam as he does have an allergy to penicillin. Patient does have a history of MRSA. In the emergency department, patient was noted to have a left elevated lactic acid. [facility] called for admission.  Assessment & Plan   Right arm abscess/wound -Continue aztreonam (PCN allergy) and vancomycin per pharmacy -Orthopedic surgery consulted and appreciated, Dr. Amedeo Plenty  -ESR 19, CRP 0.7 -S/p I&D, fasciotomy  -Continue pain control  Hypokalemia -Resolved  Nausea -Possibly related to GERD vs abscess although patient does not have leukocytosis and is afebrile -Antiemetics as needed  GERD/Gastric Ulcer -Continue PPI  Lactic acidosis -3.12 upon  admission -Likely secondary to the abscess  Normocytic anemia -Hemoglobin currently 11.6, continue to monitor CBC  Polysubstance abuse/ tobacco abuse -Patient does admit to using marijuana and smoking cigarettes -Patient counseled on advised to stop -UDS: +amphetamines, THC, opiates -Spoke with patient regarding amphetamines, states he uses prescription Adderall as needed.   Code Status: Full  Family Communication: None at bedside  Disposition Plan: Admitted  Time Spent in minutes   30 minutes  Procedures  I&D RUE Resection of clotted basilic vein Fasciotomy RUE Median nerve release with neurolysis Extraction of foreign body Neurolysis lateral antebrachial and medial cutaneous nerves  Consults   Orthopedics, Dr. Amedeo Plenty  DVT Prophylaxis  Lovenox  Lab Results  Component Value Date   PLT 397 10/12/2015    Medications  Scheduled Meds: . aztreonam  1 g Intravenous Q8H  . docusate sodium  100 mg Oral BID  . enoxaparin (LOVENOX) injection  40 mg Subcutaneous Q24H  . pantoprazole  40 mg Oral BID  . senna  1 tablet Oral BID  . vancomycin  1,500 mg Intravenous Q12H  . vitamin C  1,000 mg Oral Daily   Continuous Infusions:  PRN Meds:.acetaminophen **OR** acetaminophen, bisacodyl, diphenhydrAMINE,  famotidine, HYDROmorphone (DILAUDID) injection, magnesium citrate, methocarbamol **OR** methocarbamol (ROBAXIN)  IV, ondansetron **OR** ondansetron (ZOFRAN) IV, oxyCODONE, polyethylene glycol, promethazine  Antibiotics    Anti-infectives    Start     Dose/Rate Route Frequency Ordered Stop   10/12/15 0600  vancomycin (VANCOCIN) 1,500 mg in sodium chloride 0.9 % 500 mL IVPB     1,500 mg 250 mL/hr over 120 Minutes Intravenous Every 12 hours 10/11/15 1747     10/12/15 0200  aztreonam (AZACTAM) 1 g in dextrose 5 % 50 mL IVPB     1 g 100 mL/hr over 30 Minutes Intravenous Every 8 hours 10/11/15 1752     10/11/15 1645  vancomycin (VANCOCIN) 1,500 mg in sodium chloride 0.9 % 500 mL  IVPB     1,500 mg 250 mL/hr over 120 Minutes Intravenous STAT 10/11/15 1630 10/11/15 1920   10/11/15 1645  aztreonam (AZACTAM) 1 g in dextrose 5 % 50 mL IVPB     1 g 100 mL/hr over 30 Minutes Intravenous STAT 10/11/15 1630 10/11/15 1718      Subjective:   Travis Callahan seen and examined today.  Patient denies any chest pain, shortness breath, abdominal pain, nausea or vomiting at this time. Denies using IV drugs.    Objective:   Filed Vitals:   10/12/15 0030 10/12/15 0139 10/12/15 0300 10/12/15 0748  BP: 152/86 138/80 119/76   Pulse: 74 74 66   Temp: 97.8 F (36.6 C) 97.7 F (36.5 C) 97.8 F (36.6 C)   TempSrc: Oral Oral Oral   Resp: 16 18 18    Height:    5' 9"  (1.753 m)  Weight:    72 kg (158 lb 11.7 oz)  SpO2: 100% 99% 99%     Wt Readings from Last 3 Encounters:  10/12/15 72 kg (158 lb 11.7 oz)  10/10/15 72.576 kg (160 lb)     Intake/Output Summary (Last 24 hours) at 10/12/15 1242 Last data filed at 10/12/15 1100  Gross per 24 hour  Intake    600 ml  Output   1175 ml  Net   -575 ml    Exam  General: Well developed, well nourished, NAD, appears stated age  5: NCAT,mucous membranes moist.   Cardiovascular: S1 S2 auscultated, no rubs, murmurs or gallops. Regular rate and rhythm.  Respiratory: Clear to auscultation bilaterally with equal chest rise  Abdomen: Soft, nontender, nondistended, + bowel sounds  Extremities: warm dry without cyanosis clubbing or edema. RUE wrapped  Neuro: AAOx3, nonfocal  Skin: Without rashes exudates or nodules  Psych: Normal affect and demeanor with intact judgement and insight  Data Review   Micro Results Recent Results (from the past 240 hour(s))  Culture, blood (routine x 2)     Status: None (Preliminary result)   Collection Time: 10/10/15  3:27 AM  Result Value Ref Range Status   Specimen Description BLOOD RIGHT HAND  Final   Special Requests BOTTLES DRAWN AEROBIC AND ANAEROBIC 5ML  Final   Culture    Final    NO GROWTH 2 DAYS Performed at [facility]    Report Status PENDING  Incomplete  Culture, blood (routine x 2)     Status: None (Preliminary result)   Collection Time: 10/10/15  3:35 AM  Result Value Ref Range Status   Specimen Description BLOOD LEFT HAND  Final   Special Requests BOTTLES DRAWN AEROBIC AND ANAEROBIC 5ML  Final   Culture   Final    NO GROWTH 2 DAYS Performed at  [facility]    Report Status PENDING  Incomplete  Wound culture     Status: None (Preliminary result)   Collection Time: 10/10/15  3:46 AM  Result Value Ref Range Status   Specimen Description WOUND RIGHT ARM  Final   Special Requests Normal  Final   Gram Stain PENDING  Incomplete   Culture   Final    NO GROWTH 2 DAYS Performed at [facility]    Report Status PENDING  Incomplete  Fungus Culture with Smear     Status: None (Preliminary result)   Collection Time: 10/11/15 10:14 PM  Result Value Ref Range Status   Specimen Description WOUND RIGHT UPPER ARM  Final   Special Requests NONE  Final   Fungal Smear   Final    NO YEAST OR FUNGAL ELEMENTS SEEN Performed at [facility]    Culture   Final    CULTURE IN PROGRESS FOR FOUR WEEKS Performed at [facility]    Report Status PENDING  Incomplete    Radiology Reports Dg Elbow 2 Views Right  10/11/2015  CLINICAL DATA:  Foreign body in soft tissues. Dr. found a needle in the patient's right arm while performing irrigation and debridement of the wound. The needle was removed. To evaluate for additional foreign bodies. EXAM: RIGHT ELBOW - 2 VIEW COMPARISON:  None. FINDINGS: Single-view obtained of the right elbow. No radiopaque soft tissue foreign bodies are demonstrated. There is a fair amount of gas demonstrated in the soft tissues possibly indicating infection or postoperative changes due to debridement. Gas tracks into the visualized upper arm. Visualized bones appear grossly intact. IMPRESSION: No  radiopaque soft tissue foreign bodies identified. Soft tissue gas collections are present. Electronically Signed   By: Lucienne Capers M.D.   On: 10/11/2015 23:20    CBC  Recent Labs Lab 10/10/15 0340 10/11/15 1630 10/12/15 0500  WBC 9.6 7.6 10.1  HGB 11.7* 12.0* 11.6*  HCT 34.3* 36.8* 35.5*  PLT 429* 461* 397  MCV 85.8 87.0 88.3  MCH 29.3 28.4 28.9  MCHC 34.1 32.6 32.7  RDW 14.6 14.6 14.8  LYMPHSABS 2.4 2.4  --   MONOABS 0.9 0.6  --   EOSABS 0.1 0.1  --   BASOSABS 0.0 0.0  --     Chemistries   Recent Labs Lab 10/10/15 0340 10/11/15 1630 10/12/15 0500  NA 141 141 136  K 4.0 3.4* 4.2  CL 107 105 102  CO2 22 24 25   GLUCOSE 85 119* 95  BUN 14 10 8   CREATININE 0.87 0.74 0.68  CALCIUM 9.6 9.0 8.9  AST 30 25 21   ALT 24 23 22   ALKPHOS 79 82 71  BILITOT 0.6 0.4 0.6   ------------------------------------------------------------------------------------------------------------------ estimated creatinine clearance is 141.2 mL/min (by C-G formula based on Cr of 0.68). ------------------------------------------------------------------------------------------------------------------ No results for input(s): HGBA1C in the last 72 hours. ------------------------------------------------------------------------------------------------------------------ No results for input(s): CHOL, HDL, LDLCALC, TRIG, CHOLHDL, LDLDIRECT in the last 72 hours. ------------------------------------------------------------------------------------------------------------------ No results for input(s): TSH, T4TOTAL, T3FREE, THYROIDAB in the last 72 hours.  Invalid input(s): FREET3 ------------------------------------------------------------------------------------------------------------------ No results for input(s): VITAMINB12, FOLATE, FERRITIN, TIBC, IRON, RETICCTPCT in the last 72 hours.  Coagulation profile No results for input(s): INR, PROTIME in the last 168 hours.  No results for input(s):  DDIMER in the last 72 hours.  Cardiac Enzymes No results for input(s): CKMB, TROPONINI, MYOGLOBIN in the last 168 hours.  Invalid input(s): CK ------------------------------------------------------------------------------------------------------------------ Invalid input(s): POCBNP    Nupur Hohman,  Amiria Orrison D.O. on 10/12/2015 at 12:42 PM  Between 7am to 7pm - Pager - [phone]  After 7pm go to [email or web address] - password [email or web address]  And look for the night coverage person covering for me after hours  [facility] Office  [phone]

## 2015-10-12 NOTE — Progress Notes (Signed)
Pt arrived to unit room 1519 via stretcher. VS taken pt oriented to room and callbell with no complications. Pain 8/10, alert and oriented x4. Pt guide at the bedside and initial assessment completed. Will continue to monitor and intervene as appropriate.

## 2015-10-12 NOTE — Progress Notes (Signed)
Subjective: 1 Day Post-Op Procedure(s) (LRB): IRRIGATION AND DEBRIDEMENT right upper arm (Right) Patient reports pain as controlled. He denies any current nausea, vomiting, fever or chills. We have discussed all issues with the patient at length.  Objective: Vital signs in last 24 hours: Temp:  [97.7 F (36.5 C)-98.7 F (37.1 C)] 98.7 F (37.1 C) (11/21 1434) Pulse Rate:  [66-85] 75 (11/21 1434) Resp:  [11-20] 20 (11/21 1434) BP: (119-152)/(71-102) 143/71 mmHg (11/21 1434) SpO2:  [95 %-100 %] 95 % (11/21 1434) Weight:  [72 kg (158 lb 11.7 oz)] 72 kg (158 lb 11.7 oz) (11/21 0748)  Intake/Output from previous day: 11/20 0701 - 11/21 0700 In: 600 [I.V.:600] Out: 475 [Urine:400; Blood:75] Intake/Output this shift: Total I/O In: -  Out: 1250 [Urine:1250]   Recent Labs  10/10/15 0340 10/11/15 1630 10/12/15 0500  HGB 11.7* 12.0* 11.6*    Recent Labs  10/11/15 1630 10/12/15 0500  WBC 7.6 10.1  RBC 4.23 4.02*  HCT 36.8* 35.5*  PLT 461* 397    Recent Labs  10/11/15 1630 10/12/15 0500  NA 141 136  K 3.4* 4.2  CL 105 102  CO2 24 25  BUN 10 8  CREATININE 0.74 0.68  GLUCOSE 119* 95  CALCIUM 9.0 8.9   Labs/cultures are pending, no growth to date  The patient is pleasant, conversant, in no acute distress  Evaluation of the right upper extremity reveals that his dressings are clean, dry and intact. He has mild edema about the dorsal aspect of the hand and fingers. This is in a dependent position. We discussed with him merits of elevation, edema control finger range of motion. Sensation refill are intact  Assessment/Plan: 1 Day Post-Op Procedure(s) (LRB): IRRIGATION AND DEBRIDEMENT right upper arm (Right) We have discussed with the patient the need for diligent elevation of the upper extremity, edema control, range of motion of the digits. We have discussed with him there. He can endeavors tomorrow, his dressings will be removed hydrotherapy will be performed,  wet-to-dry dressings will be demonstrated in additions his drains will be removed tomorrow. We will await final cultures transitioning to by mouth antibiotics. Questions were encouraged and answered. We have discussed with the patient the issues regarding their infection to the extremity. We will continue antibiotics and await culture results. Often times it will take 3-5 days for cultures to become final. During this time we will typically have the patient on intravenous antibiotics until we can find a parenteral route of antibiotic regime specific for the bacteria or organism isolated. We have discussed with the patient the need for daily irrigation and debridement as well as therapy to the area. We have discussed with the patient the necessity of range of motion to the involved joints as discussed today. We have discussed with the patient the unpredictability of infections at times. We'll continue to work towards good pain control and restoration of function. The patient understands the need for meticulous wound care and the necessity of proper followup.  The possible complications of stiffness (loss of motion), resistant infection, possible deep bone infection, possible chronic pain issues, possible need for multiple surgeries and even amputation.  With this in mind the patient understands our goal is to eradicate the infection to quiesence. We will continue to work towards these goals.  Zeplin Aleshire L 10/12/2015, 4:48 PM

## 2015-10-12 NOTE — Op Note (Signed)
NAMEKy Barban NO.:  [id number]  MEDICAL RECORD NO.:  [id number]  LOCATION:  [id number]                         FACILITY:  [facility]  PHYSICIAN:  Satira Anis. Maly Lemarr, M.D.DATE OF BIRTH:  1991/09/05  DATE OF PROCEDURE:  10/11/2015 DATE OF DISCHARGE:                              OPERATIVE REPORT   PREOPERATIVE DIAGNOSES: 1. Deep abscess, left upper arm. 2. 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  POSTOPERATIVE DIAGNOSIS:  Significant abscess with necrotic tissue, left upper arm and associated median nerve compression and foreign body (needle) in the soft tissues.  SURGICAL PROCEDURES: 1. Irrigation and debridement, deep abscess, left upper arm.  This was     an excisional debridement of skin, subcutaneous tissue, muscle, and     fascia. 2. Resection significant portion of the clotted basilic vein. 3. Fasciotomy, right upper arm. 4. Median nerve release and neurolysis with excision of necrotic     portions of the thickened lacertus fibrosus.  This was a proximal     median nerve decompression secondary to acute compressive lesion. 5. Extraction of foreign body, right upper arm in the form of a     needle, sent to Pathology. 6. Neurolysis, lateral antebrachial cutaneous nerve and medial     antebrachial cutaneous nerve.  SURGEON:  Satira Anis. Amedeo Plenty, M.D.  ASSISTANT:  None.  COMPLICATIONS:  None.  ANESTHESIA:  General.  TOURNIQUET TIME:  Less than an hour.  INDICATIONS FOR PROCEDURE:  This patient is a white male, who presents with the above-mentioned diagnosis.  I have counseled him in regard to risks and benefits of surgery including risk of infection, bleeding, anesthesia, damage to normal structures, and failure of surgery to accomplish its intended goals of relieving symptoms and restoring function.  At present time, the patient has had significant issues over the last days of progressive swelling  and pain in the arm.  ER visits demonstrate these issues.  He was admitted by the medical service and I was asked to consult.  Upon seeing the patient, I recognized an abscess with necrotic elements and recommended surgical debridement.  DESCRIPTION OF PROCEDURE:  The patient was taken to procedure suite.  He was counseled by anesthesia.  He was prepped and draped in usual sterile fashion after general anesthesia was employed.  We performed 2 separate Hibiclens scrubs followed by a 10-minute surgical Betadine scrub. Sterile field was secured.  Sterile tourniquet applied.  Arm was elevated and tourniquet was insufflated.  Time-out had been observed.  At this time, I made extension modified zigzag being careful not crossing antecubital crease with the incision.  Skin flaps were elevated.  A large amount of necrotic tissue was noted.  I performed I and D of the necrotic tissue, and sent this for culture.  Aerobic, anaerobic, atypical, and fungal cultures were all taken.  Following this, the patient then underwent a very careful and cautious approach to the upper extremity with identification of the biceps fascia.  The patient underwent fasciectomy and compartment release.  This was a fasciotomy about the upper arm.  I had to remove a large amount of necrotic tissue and peeled back the fascia, which was  obviously necrotic until I could get a good normal tissue.  Following the fasciotomy and debridement of the biceps muscle, I then turned attention toward the compressive lesion about the neurovascular structures namely the median nerve and brachial artery.  The lacertus fibrosus was necrotic.  I released this and excised this.  I kept intact the biceps tendon and of course kept intact the median nerve and brachial artery.  I was very meticulous with this.  I had to dance around the cephalic and basilic vein.  The cephalic vein appeared to have some clotted area with it and the basilic vein  was completely clotted.  At this time, I removed portions of the basilic vein.  It was clotted and necrotic.  This was tied off with Vicryl of the #2 variety.  Following this, I identified a needle in the wound.  This was a 1 cm slightly greater foreign body.  This was excised and removed and sent for specimen to Pathology.  It did not appear to be a hollow needle.  At this time, for fear of further foreign bodies, I took an intraoperative x-ray.  The intraoperative x-ray did not reveal any further foreign bodies and normal bony architecture.  This was reviewed and interpreted by myself intraoperatively.  Following this, we irrigated with 6 L of fluid.  The lateral antebrachial cutaneous nerve and the medial antebrachial cutaneous nerve were identified and underwent neurolysis.  This neurolysis was done to protect the nerves and keep them out of the necrotic tissue.  The patient tolerated this well.  After 6 L of saline placed through and through the wound, I then closed loosely over Penrose drains.  Compartments were soft.  Fasciotomy was performed and there were no issues.  He was dressed in a sterile bulky dressing of Adaptic, 4x4s, Xeroform gauze, Webril, and Ace wrap.  He will be admitted for IV antibiotics, general postoperative observation, and other measures.  We will look forward to seeing him daily and hopefully, rid him of his severe infection.     Satira Anis. Amedeo Plenty, M.D.     [facility]  D:  10/11/2015  T:  10/12/2015  Job:  [id number]

## 2015-10-13 LAB — CBC
HCT: 36.1 % — ABNORMAL LOW (ref 39.0–52.0)
HEMOGLOBIN: 11.9 g/dL — AB (ref 13.0–17.0)
MCH: 28.7 pg (ref 26.0–34.0)
MCHC: 33 g/dL (ref 30.0–36.0)
MCV: 87 fL (ref 78.0–100.0)
PLATELETS: 343 10*3/uL (ref 150–400)
RBC: 4.15 MIL/uL — AB (ref 4.22–5.81)
RDW: 14.7 % (ref 11.5–15.5)
WBC: 5.5 10*3/uL (ref 4.0–10.5)

## 2015-10-13 LAB — BASIC METABOLIC PANEL
ANION GAP: 11 (ref 5–15)
BUN: 12 mg/dL (ref 6–20)
CALCIUM: 8.7 mg/dL — AB (ref 8.9–10.3)
CO2: 23 mmol/L (ref 22–32)
Chloride: 102 mmol/L (ref 101–111)
Creatinine, Ser: 0.77 mg/dL (ref 0.61–1.24)
Glucose, Bld: 105 mg/dL — ABNORMAL HIGH (ref 65–99)
POTASSIUM: 4.1 mmol/L (ref 3.5–5.1)
SODIUM: 136 mmol/L (ref 135–145)

## 2015-10-13 LAB — VANCOMYCIN, TROUGH: VANCOMYCIN TR: 9 ug/mL — AB (ref 10.0–20.0)

## 2015-10-13 LAB — LACTIC ACID, PLASMA: LACTIC ACID, VENOUS: 1.8 mmol/L (ref 0.5–2.0)

## 2015-10-13 MED ORDER — DOXYCYCLINE HYCLATE 100 MG PO TABS
100.0000 mg | ORAL_TABLET | Freq: Once | ORAL | Status: DC
  Filled 2015-10-13: qty 1

## 2015-10-13 MED ORDER — HYDROMORPHONE HCL 1 MG/ML IJ SOLN
1.0000 mg | Freq: Once | INTRAMUSCULAR | Status: AC
  Administered 2015-10-13: 1 mg via INTRAVENOUS

## 2015-10-13 NOTE — Progress Notes (Signed)
IV no longer patent. IV vancomycin due now. Phlebotomy unable to draw blood for vanc trough. Notified MD. Placed order for one dose of doxycycline and IV team consult for the morning.

## 2015-10-13 NOTE — Progress Notes (Signed)
10/13/15  [id number] HYDROTHERAPY EVALUATION  Subjective Assessment  Subjective it is really  painful, I can't feel much of my arm and hand  Patient and Family Stated Goals to get this better so i can take care of my son  Date of Onset 10/10/15 (to ED with noted wound, left and returned 11/20.)  Prior Treatments Iand D 10/11/15  Evaluation and Treatment  Evaluation and Treatment Procedures Explained to Patient/Family Yes  Evaluation and Treatment Procedures agreed to  Wound / Incision (Open or Dehisced) 10/13/15 Incision - Open Arm Right;Upper;Other (Comment) upper and antecubital incisio with sutures spanning the wound, penrose drains in place, blood clot in superior open area.  Date First Assessed/Time First Assessed: 10/13/15 1500   Wound Type: Incision - Open  Location: Arm  Location Orientation: Right;Upper;Other (Comment)  Wound Description (Comments): upper and antecubital incisio with sutures spanning the wound, penros...  Dressing Type Gauze (Comment);Impregnated gauze (petrolatum);Moist to dry  Dressing Changed New  Dressing Status Clean;Dry;Intact  Dressing Change Frequency Daily  Site / Wound Assessment Granulation tissue;Painful;Other (Comment) (clotted blood in superior wound, difficult to see inside wou)  % Wound base Red or Granulating 5% (difficult to visualize,)  % Wound base Other (Comment) (possible tendon in inferior wound.)  Peri-wound Assessment Intact (rashy redness.)  Wound Length (cm) 10.5 cm  Wound Width (cm) 0.5 cm  Wound Depth (cm) 0.5 cm (too painful to probe deeper.)  Margins Unattached edges (unapproximated) (~ 4 sutures span the incision about every  several centimete)  Drainage Amount Moderate  Drainage Description Serosanguineous (old blood)  Non-staged Wound Description Not applicable  Treatment Hydrotherapy (Pulse lavage);Packing (Saline gauze)  Hydrotherapy  Pulsed lavage therapy - wound location R arm antecubital  Pulsed Lavage with  Suction (psi) 4 psi  Pulsed Lavage with Suction - Normal Saline Used (100, too painful to dontinue)  Pulsed Lavage Tip Tip with splash shield  Selective Debridement  Selective Debridement - Location with guaze, blood clot  Selective Debridement - Tissue Removed clotted blood  Wound Therapy - Assess/Plan/Recommendations  Wound Therapy - Clinical Statement 24 yo male presents with post I and D and fasciotomy of the  R arm on 10/11/15.. Incision with 2 penrose drains and sutures across incision. Old dressing removed carefully and 2 penrose drains removed per MD orrder. 3 pictures taken and sent to Dr. Amedeo Plenty. During dressing removal, patient noted to begin to turn rashy red on the arms and across chest and up the R neck. RN in to inspect and  MD notified. Patient was not having any difficulty breathing but was maximally guarding  and breathing light due to severe pain. Able to continue  with the PLS but only very small amount of  saline used as  the pain became too great and patient asked to ceases the treatment. Patient's  dressing  placed by wicking corners of saline guaze in wound, covered with petroleum guaze, ABD and kerlix and ACE. patient reports impaired sensation of entire arm and even  above the  incision. Encouraged ROM of hand and fingers which he has some intact for flexion and extension.. Continue PLS as patient able to tolerate.  Wound Therapy - Functional Problem List impaired sensation and AROM R elbow and hand, ambulates well.  Factors Delaying/Impairing Wound Healing Substance abuse  Hydrotherapy Plan Dressing change;Pulsatile lavage with suction  Wound Therapy - Current Recommendations OT  Wound Therapy - Follow Up Recommendations Home health RN (per MD to instruct patient in dressing change.)  Wound  Plan PLS as able.  Wound Therapy Goals - Improve the function of patient's integumentary system by progressing the wound(s) through the phases of wound healing by:  Decrease Necrotic  Tissue to 50  Decrease Necrotic Tissue - Progress Goal set today  Increase Granulation Tissue to 50  Increase Granulation Tissue - Progress Goal set today  Decrease Length/Width/Depth by (cm) 2  Decrease Length/Width/Depth - Progress Goal set today  Improve Drainage Characteristics Min  Improve Drainage Characteristics - Progress Goal set today  Patient/Family will be able to  change dressing  Patient/Family Instruction Goal - Progress Goal set today  Additional Wound Therapy Goal perform self ROM independently  Additional Wound Therapy Goal - Progress Goal set today  Goals/treatment plan/discharge plan were made with and agreed upon by patient/family Yes  Time For Goal Achievement 2 weeks  Wound Therapy - Potential for Goals Alfredo Martinez PT [phone]

## 2015-10-13 NOTE — Progress Notes (Signed)
[facility]                                                                              Patient Demographics  Travis Callahan, is a 24 y.o. male, DOB - 11-02-1991, WSF:[id number]  Admit date - 10/11/2015   Admitting Physician Cristal Ford, DO  Outpatient Primary MD for the patient is No primary care provider on file.  LOS - 2   Chief Complaint  Patient presents with  . Abscess      HPI on 10/11/2015 Travis Callahan is a 24 y.o. male with a history of gastric ulcer perforation, GERD, tobacco abuse who presented to the emergency department with complaints of right arm abscess. Patient had presented to the ER on 10/10/2015 and was supposed to be admitted at that time however refuse admission due to priorities at home. It seems that patient had noticed a pimple type area on his right arm couple of days ago. He stated that had a whitehead type of appearance. Over the next 24 hours, he had grown very large was very red and painful. Currently he complains of throbbing pain. Patient states he taken a hot shower and then the abscess started draining. At that point he decided to push on it and noticed pus coming out. Patient did not have IND as the wound was already draining at that time. Patient was given vancomycin and aztreonam as he does have an allergy to penicillin. Patient does have a history of MRSA. In the emergency department, patient was noted to have a left elevated lactic acid. [facility] called for admission.  Assessment & Plan   Right arm abscess/wound -Continue aztreonam (PCN allergy) and vancomycin per pharmacy -Orthopedic surgery consulted and appreciated, Dr. Amedeo Plenty  -Foreign object removed, noted to be a needle or staple on pathology -ESR 19, CRP 0.7 -S/p I&D, fasciotomy, drains in place, to be removed today -Blood cultures and wound culture negative to date -Continue pain control  Hypokalemia -Resolved  Nausea -Possibly related to GERD vs abscess although  patient does not have leukocytosis and is afebrile -Antiemetics as needed  GERD/Gastric Ulcer -Continue PPI  Lactic acidosis -3.12 upon admission, resolved to 1.8 -Likely secondary to the abscess  Normocytic anemia -Hemoglobin currently 11.9, continue to monitor CBC  Polysubstance abuse/ tobacco abuse -Patient does admit to using marijuana and smoking cigarettes -Patient counseled on advised to stop -UDS: +amphetamines, THC, opiates -Spoke with patient regarding amphetamines, states he uses prescription Adderall as needed.   Code Status: Full  Family Communication: None at bedside  Disposition Plan: Admitted. Pending further recommendations from ortho.  Time Spent in minutes   30 minutes  Procedures  I&D RUE Resection of clotted basilic vein Fasciotomy RUE Median nerve release with neurolysis Extraction of foreign body Neurolysis lateral antebrachial and medial cutaneous nerves  Consults   Orthopedics, Dr. Amedeo Plenty  DVT Prophylaxis  Lovenox  Lab Results  Component Value Date   PLT 343 10/13/2015    Medications  Scheduled Meds: . aztreonam  1 g Intravenous Q8H  . docusate sodium  100 mg Oral BID  . enoxaparin (LOVENOX) injection  40 mg Subcutaneous Q24H  . pantoprazole  40 mg Oral BID  .  senna  1 tablet Oral BID  . vancomycin  1,500 mg Intravenous Q12H  . vitamin C  1,000 mg Oral Daily   Continuous Infusions:  PRN Meds:.acetaminophen **OR** acetaminophen, bisacodyl, diphenhydrAMINE, famotidine, HYDROmorphone (DILAUDID) injection, magnesium citrate, methocarbamol **OR** methocarbamol (ROBAXIN)  IV, ondansetron **OR** ondansetron (ZOFRAN) IV, oxyCODONE, polyethylene glycol, promethazine  Antibiotics    Anti-infectives    Start     Dose/Rate Route Frequency Ordered Stop   10/12/15 0600  vancomycin (VANCOCIN) 1,500 mg in sodium chloride 0.9 % 500 mL IVPB     1,500 mg 250 mL/hr over 120 Minutes Intravenous Every 12 hours 10/11/15 1747     10/12/15 0200   aztreonam (AZACTAM) 1 g in dextrose 5 % 50 mL IVPB     1 g 100 mL/hr over 30 Minutes Intravenous Every 8 hours 10/11/15 1752     10/11/15 1645  vancomycin (VANCOCIN) 1,500 mg in sodium chloride 0.9 % 500 mL IVPB     1,500 mg 250 mL/hr over 120 Minutes Intravenous STAT 10/11/15 1630 10/11/15 1920   10/11/15 1645  aztreonam (AZACTAM) 1 g in dextrose 5 % 50 mL IVPB     1 g 100 mL/hr over 30 Minutes Intravenous STAT 10/11/15 1630 10/11/15 1718      Subjective:   Travis Callahan seen and examined today.  Patient states he has no complaints this morning.  Patient denies any chest pain, shortness breath, abdominal pain, nausea or vomiting at this time.  Objective:   Filed Vitals:   10/12/15 0748 10/12/15 1434 10/12/15 2125 10/13/15 0535  BP:  143/71 137/78 136/74  Pulse:  75 72 69  Temp:  98.7 F (37.1 C) 98.3 F (36.8 C) 98.3 F (36.8 C)  TempSrc:  Oral Oral Oral  Resp:  20 20 20   Height: 5' 9"  (1.753 m)     Weight: 72 kg (158 lb 11.7 oz)     SpO2:  95% 100% 100%    Wt Readings from Last 3 Encounters:  10/12/15 72 kg (158 lb 11.7 oz)  10/10/15 72.576 kg (160 lb)     Intake/Output Summary (Last 24 hours) at 10/13/15 1220 Last data filed at 10/13/15 0901  Gross per 24 hour  Intake   1940 ml  Output   1900 ml  Net     40 ml    Exam  General: Well developed, well nourished, NAD, appears stated age  95: NCAT,mucous membranes moist.   Cardiovascular: S1 S2 auscultated, no rubs, murmurs or gallops. Regular rate and rhythm.  Respiratory: Clear to auscultation bilaterally   Abdomen: Soft, nontender, nondistended, + bowel sounds, midline longitudinal well-healed scar  Extremities: warm dry without cyanosis clubbing or edema. RUE wrapped  Neuro: AAOx3, nonfocal  Psych: Normal affect and demeanor, pleasant  Data Review   Micro Results Recent Results (from the past 240 hour(s))  Culture, blood (routine x 2)     Status: None (Preliminary result)   Collection  Time: 10/10/15  3:27 AM  Result Value Ref Range Status   Specimen Description BLOOD RIGHT HAND  Final   Special Requests BOTTLES DRAWN AEROBIC AND ANAEROBIC 5ML  Final   Culture   Final    NO GROWTH 3 DAYS Performed at [facility]    Report Status PENDING  Incomplete  Culture, blood (routine x 2)     Status: None (Preliminary result)   Collection Time: 10/10/15  3:35 AM  Result Value Ref Range Status   Specimen Description BLOOD LEFT HAND  Final   Special Requests BOTTLES DRAWN AEROBIC AND ANAEROBIC 5ML  Final   Culture   Final    NO GROWTH 3 DAYS Performed at [facility]    Report Status PENDING  Incomplete  Wound culture     Status: None   Collection Time: 10/10/15  3:46 AM  Result Value Ref Range Status   Specimen Description WOUND RIGHT ARM  Final   Special Requests Normal  Final   Gram Stain   Final    RARE WBC PRESENT, PREDOMINANTLY PMN NO SQUAMOUS EPITHELIAL CELLS SEEN NO ORGANISMS SEEN Performed at [facility]    Culture   Final    NO GROWTH 2 DAYS Performed at [facility]    Report Status 10/12/2015 FINAL  Final  Anaerobic culture     Status: None (Preliminary result)   Collection Time: 10/11/15 10:14 PM  Result Value Ref Range Status   Specimen Description WOUND RIGHT UPPER ARM  Final   Special Requests NONE  Final   Gram Stain   Final    RARE WBC PRESENT, PREDOMINANTLY MONONUCLEAR NO SQUAMOUS EPITHELIAL CELLS SEEN NO ORGANISMS SEEN Performed at [facility]    Culture   Final    NO ANAEROBES ISOLATED; CULTURE IN PROGRESS FOR 5 DAYS Performed at [facility]    Report Status PENDING  Incomplete  Fungus Culture with Smear     Status: None (Preliminary result)   Collection Time: 10/11/15 10:14 PM  Result Value Ref Range Status   Specimen Description WOUND RIGHT UPPER ARM  Final   Special Requests NONE  Final   Fungal Smear   Final    NO YEAST OR FUNGAL ELEMENTS SEEN Performed at [facility]      Culture   Final    CULTURE IN PROGRESS FOR FOUR WEEKS Performed at [facility]    Report Status PENDING  Incomplete  Wound culture     Status: None (Preliminary result)   Collection Time: 10/11/15 10:14 PM  Result Value Ref Range Status   Specimen Description WOUND RIGHT UPPER ARM  Final   Special Requests NONE  Final   Gram Stain   Final    RARE WBC PRESENT, PREDOMINANTLY MONONUCLEAR NO SQUAMOUS EPITHELIAL CELLS SEEN NO ORGANISMS SEEN Performed at [facility]    Culture   Final    NO GROWTH 1 DAY Performed at [facility]    Report Status PENDING  Incomplete    Radiology Reports Dg Elbow 2 Views Right  10/11/2015  CLINICAL DATA:  Foreign body in soft tissues. Dr. found a needle in the patient's right arm while performing irrigation and debridement of the wound. The needle was removed. To evaluate for additional foreign bodies. EXAM: RIGHT ELBOW - 2 VIEW COMPARISON:  None. FINDINGS: Single-view obtained of the right elbow. No radiopaque soft tissue foreign bodies are demonstrated. There is a fair amount of gas demonstrated in the soft tissues possibly indicating infection or postoperative changes due to debridement. Gas tracks into the visualized upper arm. Visualized bones appear grossly intact. IMPRESSION: No radiopaque soft tissue foreign bodies identified. Soft tissue gas collections are present. Electronically Signed   By: Lucienne Capers M.D.   On: 10/11/2015 23:20    CBC  Recent Labs Lab 10/10/15 0340 10/11/15 1630 10/12/15 0500 10/13/15 0642  WBC 9.6 7.6 10.1 5.5  HGB 11.7* 12.0* 11.6* 11.9*  HCT 34.3* 36.8* 35.5* 36.1*  PLT 429* 461* 397 343  MCV 85.8 87.0 88.3 87.0  MCH 29.3 28.4 28.9 28.7  MCHC 34.1 32.6 32.7 33.0  RDW 14.6 14.6 14.8 14.7  LYMPHSABS 2.4 2.4  --   --   MONOABS 0.9 0.6  --   --   EOSABS 0.1 0.1  --   --   BASOSABS 0.0 0.0  --   --     Chemistries   Recent Labs Lab 10/10/15 0340 10/11/15 1630 10/12/15 0500  10/13/15 0515  NA 141 141 136 136  K 4.0 3.4* 4.2 4.1  CL 107 105 102 102  CO2 22 24 25 23   GLUCOSE 85 119* 95 105*  BUN 14 10 8 12   CREATININE 0.87 0.74 0.68 0.77  CALCIUM 9.6 9.0 8.9 8.7*  AST 30 25 21   --   ALT 24 23 22   --   ALKPHOS 79 82 71  --   BILITOT 0.6 0.4 0.6  --    ------------------------------------------------------------------------------------------------------------------ estimated creatinine clearance is 141.2 mL/min (by C-G formula based on Cr of 0.77). ------------------------------------------------------------------------------------------------------------------ No results for input(s): HGBA1C in the last 72 hours. ------------------------------------------------------------------------------------------------------------------ No results for input(s): CHOL, HDL, LDLCALC, TRIG, CHOLHDL, LDLDIRECT in the last 72 hours. ------------------------------------------------------------------------------------------------------------------ No results for input(s): TSH, T4TOTAL, T3FREE, THYROIDAB in the last 72 hours.  Invalid input(s): FREET3 ------------------------------------------------------------------------------------------------------------------ No results for input(s): VITAMINB12, FOLATE, FERRITIN, TIBC, IRON, RETICCTPCT in the last 72 hours.  Coagulation profile No results for input(s): INR, PROTIME in the last 168 hours.  No results for input(s): DDIMER in the last 72 hours.  Cardiac Enzymes No results for input(s): CKMB, TROPONINI, MYOGLOBIN in the last 168 hours.  Invalid input(s): CK ------------------------------------------------------------------------------------------------------------------ Invalid input(s): POCBNP    Jaimi Belle D.O. on 10/13/2015 at 12:20 PM  Between 7am to 7pm - Pager - [phone]  After 7pm go to [email or web address] - password [email or web address]  And look for the night coverage person covering for me after hours  [facility] Office  [phone]

## 2015-10-13 NOTE — Progress Notes (Addendum)
[place] for Vancomycin / Aztreonam Indication: Wound infection  Allergies  Allergen Reactions  . Penicillins Anaphylaxis    Has patient had a PCN reaction causing immediate rash, facial/tongue/throat swelling, SOB or lightheadedness with hypotension: Unknown Has patient had a PCN reaction causing severe rash involving mucus membranes or skin necrosis: Unknown Has patient had a PCN reaction that required hospitalization: already in hospital when found out   Has patient had a PCN reaction occurring within the last 10 years: No  If all of the above answers are "NO", then may proceed with Cephalosporin use.   Marland Kitchen Morphine And Related Hives and Itching  . Nsaids Other (See Comments)    ulcers    Patient Measurements: Height: 5\' 9"  (175.3 cm) Weight: 158 lb 11.7 oz (72 kg) IBW/kg (Calculated) : 70.7 Adjusted Body Weight:   Vital Signs: Temp: 98.3 F (36.8 C) (11/22 0535) Temp Source: Oral (11/22 0535) BP: 136/74 mmHg (11/22 0535) Pulse Rate: 69 (11/22 0535) Intake/Output from previous day: 11/21 0701 - 11/22 0700 In: 1700 [IV Piggyback:1700] Out: 2600 [Urine:2600] Intake/Output from this shift:    Labs:  Recent Labs  10/11/15 1630 10/12/15 0500 10/13/15 0515 10/13/15 0642  WBC 7.6 10.1  --  5.5  HGB 12.0* 11.6*  --  11.9*  PLT 461* 397  --  343  CREATININE 0.74 0.68 0.77  --    Estimated Creatinine Clearance: 141.2 mL/min (by C-G formula based on Cr of 0.77). No results for input(s): VANCOTROUGH, VANCOPEAK, VANCORANDOM, GENTTROUGH, GENTPEAK, GENTRANDOM, TOBRATROUGH, TOBRAPEAK, TOBRARND, AMIKACINPEAK, AMIKACINTROU, AMIKACIN in the last 72 hours.   Microbiology: Recent Results (from the past 720 hour(s))  Culture, blood (routine x 2)     Status: None (Preliminary result)   Collection Time: 10/10/15  3:27 AM  Result Value Ref Range Status   Specimen Description BLOOD RIGHT HAND  Final   Special Requests BOTTLES DRAWN AEROBIC AND  ANAEROBIC 5ML  Final   Culture   Final    NO GROWTH 2 DAYS Performed at [facility]    Report Status PENDING  Incomplete  Culture, blood (routine x 2)     Status: None (Preliminary result)   Collection Time: 10/10/15  3:35 AM  Result Value Ref Range Status   Specimen Description BLOOD LEFT HAND  Final   Special Requests BOTTLES DRAWN AEROBIC AND ANAEROBIC 5ML  Final   Culture   Final    NO GROWTH 2 DAYS Performed at [facility]    Report Status PENDING  Incomplete  Wound culture     Status: None   Collection Time: 10/10/15  3:46 AM  Result Value Ref Range Status   Specimen Description WOUND RIGHT ARM  Final   Special Requests Normal  Final   Gram Stain   Final    RARE WBC PRESENT, PREDOMINANTLY PMN NO SQUAMOUS EPITHELIAL CELLS SEEN NO ORGANISMS SEEN Performed at [facility]    Culture   Final    NO GROWTH 2 DAYS Performed at [facility]    Report Status 10/12/2015 FINAL  Final  Anaerobic culture     Status: None (Preliminary result)   Collection Time: 10/11/15 10:14 PM  Result Value Ref Range Status   Specimen Description WOUND RIGHT UPPER ARM  Final   Special Requests NONE  Final   Gram Stain   Final    RARE WBC PRESENT, PREDOMINANTLY MONONUCLEAR NO SQUAMOUS EPITHELIAL CELLS SEEN NO ORGANISMS SEEN Performed at [facility]    Culture PENDING  Incomplete   Report Status PENDING  Incomplete  Fungus Culture with Smear     Status: None (Preliminary result)   Collection Time: 10/11/15 10:14 PM  Result Value Ref Range Status   Specimen Description WOUND RIGHT UPPER ARM  Final   Special Requests NONE  Final   Fungal Smear   Final    NO YEAST OR FUNGAL ELEMENTS SEEN Performed at [facility]    Culture   Final    CULTURE IN PROGRESS FOR FOUR WEEKS Performed at [facility]    Report Status PENDING  Incomplete  Wound culture     Status: None (Preliminary result)   Collection Time: 10/11/15 10:14 PM  Result  Value Ref Range Status   Specimen Description WOUND RIGHT UPPER ARM  Final   Special Requests NONE  Final   Gram Stain   Final    RARE WBC PRESENT, PREDOMINANTLY MONONUCLEAR NO SQUAMOUS EPITHELIAL CELLS SEEN NO ORGANISMS SEEN Performed at [facility]    Culture   Final    NO GROWTH 1 DAY Performed at [facility]    Report Status PENDING  Incomplete    Medical History: Past Medical History  Diagnosis Date  . MRSA (methicillin resistant Staphylococcus aureus)   . Peritonitis ([place])   . Gastric ulcer    Assessment: 59 yoM with hx MRSA infection seen in ED 2 days prior for RUE draining abscess and started on IV antibiotics but refused admission, now returns with vomiting and abdominal pain.  Pharmacy consulted to restart Vancomycin and Aztreonam for wound infection. Pt only received aztreonam on 11/19.  S/p I/D, fasciotomy on 11/20  Anti-infectives 11/20 >> Vancomycin  >> 11/20 >> Aztreonam  >>    Vitals/Labs WBC: WNL Tm24h: Afebrile Renal: SCr stable, CrCl >100 LA elevated 3.12, now improved  Cultures 11/20 wound cx: NGTD 11/20 Anaerobic wound cx: NGTD 11/20 Fungal wound cx: NGTD  Goal of Therapy:  Vancomycin trough 15-20 mcg/ml  Plan:  Continue Vancomycin 1500mg  IV q12h Continue Aztreonam 1g IV q8h Follow renal fxn, micro data, drug levels as needed, clinical course Check VT tonight at [facility]

## 2015-10-14 MED ORDER — CIPROFLOXACIN HCL 500 MG PO TABS: 500.0000 mg | ORAL_TABLET | Freq: Two times a day (BID) | ORAL | Status: DC

## 2015-10-14 MED ORDER — DOCUSATE SODIUM 100 MG PO CAPS: 100.0000 mg | ORAL_CAPSULE | Freq: Two times a day (BID) | ORAL | Status: DC

## 2015-10-14 MED ORDER — SODIUM CHLORIDE 0.9 % IV SOLN
1750.0000 mg | Freq: Two times a day (BID) | INTRAVENOUS | Status: DC
  Filled 2015-10-14: qty 1750

## 2015-10-14 MED ORDER — OXYCODONE HCL 5 MG PO TABS: 5.0000 mg | ORAL_TABLET | ORAL | Status: DC | PRN

## 2015-10-14 NOTE — Progress Notes (Signed)
10/14/15 1500 Hydrotherapy treatment  Subjective Assessment  Subjective I can feel more and move my arm nd hand, I am using it.  Patient and Family Stated Goals go home  Date of Onset 10/10/15 (to ED with noted wound, left and returned 11/20.)  Prior Treatments Iand D 10/11/15  Evaluation and Treatment  Evaluation and Treatment Procedures Explained to Patient/Family Yes  Evaluation and Treatment Procedures agreed to  Wound / Incision (Open or Dehisced) 10/13/15 Incision - Open Arm Right;Upper;Other (Comment) upper and antecubital incisio with sutures spanning the wound, penrose drains in place, blood clot in superior open area.  Date First Assessed/Time First Assessed: 10/13/15 1500   Wound Type: Incision - Open  Location: Arm  Location Orientation: Right;Upper;Other (Comment)  Wound Description (Comments): upper and antecubital incisio with sutures spanning the wound, penros...  Dressing Type Gauze (Comment);Impregnated gauze (petrolatum);Moist to dry  Dressing Changed New  Dressing Status Clean;Dry;Intact  Dressing Change Frequency Daily  Site / Wound Assessment Granulation tissue;Painful;Other (Comment) (clotted blood in superior wound, difficult to see inside wou)  % Wound base Red or Granulating 50% (difficult to visualize,)  % Wound base Yellow 50%  % Wound base Other (Comment) (possible tendon in inferior wound.)  Peri-wound Assessment Intact (rashy redness.)  Wound Length (cm) 10.5 cm  Wound Width (cm) 0.5 cm  Wound Depth (cm) 0.5 cm  Margins Unattached edges (unapproximated) (~ 4 sutures span the incision about every  several centimete)  Drainage Amount Moderate  Drainage Description Serosanguineous (old blood)  Non-staged Wound Description Not applicable  Hydrotherapy  Pulsed lavage therapy - wound location R arm antecubital  Pulsed Lavage with Suction (psi) 4 psi  Pulsed Lavage with Suction - Normal Saline Used 500 mL (100, too painful to dontinue)  Pulsed Lavage  Tip Tip with splash shield  Wound Therapy - Assess/Plan/Recommendations  Wound Therapy - Clinical Statement patient tolerated well, origianal site of  injury is most painful, covered by eschar too painful to debride. Aaron Edelman PA has seen wound today. reviewed wet to dry dressing of which patient is very aware of from previous abdominal wound dressings. encouraged clean technique.  Wound Therapy - Functional Problem List impaired sensation and AROM R elbow and hand, ambulates well.  Factors Delaying/Impairing Wound Healing Substance abuse  Hydrotherapy Plan Dressing change;Pulsatile lavage with suction  Wound Therapy - Current Recommendations OT  Wound Therapy - Follow Up Recommendations Home health RN (per MD to instruct patient in dressing change.)  Wound Plan PLS as able.  Wound Therapy Goals - Improve the function of patient's integumentary system by progressing the wound(s) through the phases of wound healing by:  Decrease Necrotic Tissue to 50  Decrease Necrotic Tissue - Progress Progressing toward goal  Increase Granulation Tissue to 50  Increase Granulation Tissue - Progress Progressing toward goal  Decrease Length/Width/Depth by (cm) 2  Improve Drainage Characteristics Min  Improve Drainage Characteristics - Progress Goal set today  Patient/Family will be able to  change dressing  Additional Wound Therapy Goal perform self ROM independently  Additional Wound Therapy Goal - Progress Progressing toward goal  Goals/treatment plan/discharge plan were made with and agreed upon by patient/family Yes  Time For Goal Achievement 2 weeks  Wound Therapy - Potential for Goals Alfredo Martinez PT [id number]   10/14/15 1500  Subjective Assessment  Subjective I can feel more and move my arma nd hand, I am using it.  Patient and Family Stated Goals go home  Date of Onset 10/10/15 (to  ED with noted wound, left and returned 11/20.)  Prior Treatments Iand D 10/11/15  Evaluation and Treatment   Evaluation and Treatment Procedures Explained to Patient/Family Yes  Evaluation and Treatment Procedures agreed to  Wound / Incision (Open or Dehisced) 10/13/15 Incision - Open Arm Right;Upper;Other (Comment) upper and antecubital incisio with sutures spanning the wound, penrose drains in place, blood clot in superior open area.  Date First Assessed/Time First Assessed: 10/13/15 1500   Wound Type: Incision - Open  Location: Arm  Location Orientation: Right;Upper;Other (Comment)  Wound Description (Comments): upper and antecubital incisio with sutures spanning the wound, penros...  Dressing Type Gauze (Comment);Impregnated gauze (petrolatum);Moist to dry  Dressing Changed New  Dressing Status Clean;Dry;Intact  Dressing Change Frequency Daily  Site / Wound Assessment Granulation tissue;Painful;Other (Comment) (clotted blood in superior wound, difficult to see inside wou)  % Wound base Red or Granulating 50% (difficult to visualize,)  % Wound base Yellow 50%  % Wound base Other (Comment) (possible tendon in inferior wound.)  Peri-wound Assessment Intact (rashy redness.)  Wound Length (cm) 10.5 cm  Wound Width (cm) 0.5 cm  Wound Depth (cm) 0.5 cm  Margins Unattached edges (unapproximated) (~ 4 sutures span the incision about every  several centimete)  Drainage Amount Moderate  Drainage Description Serosanguineous (old blood)  Non-staged Wound Description Not applicable  Hydrotherapy  Pulsed lavage therapy - wound location R arm antecubital  Pulsed Lavage with Suction (psi) 4 psi  Pulsed Lavage with Suction - Normal Saline Used 500 mL (100, too painful to dontinue)  Pulsed Lavage Tip Tip with splash shield  Wound Therapy - Assess/Plan/Recommendations  Wound Therapy - Clinical Statement patient tolerated well, origianal site of  injury is most painful, covered by eschar too painful to debride. Aaron Edelman PA has seen wound today. reviewed wet to dry dressing of which patient is very aware of  from previous abdominal wound dressings. encouraged clean technique.  Wound Therapy - Functional Problem List impaired sensation and AROM R elbow and hand, ambulates well.  Factors Delaying/Impairing Wound Healing Substance abuse  Hydrotherapy Plan Dressing change;Pulsatile lavage with suction  Wound Therapy - Current Recommendations OT  Wound Therapy - Follow Up Recommendations Home health RN (per MD to instruct patient in dressing change.)  Wound Plan PLS as able.  Wound Therapy Goals - Improve the function of patient's integumentary system by progressing the wound(s) through the phases of wound healing by:  Decrease Necrotic Tissue to 50  Decrease Necrotic Tissue - Progress Progressing toward goal  Increase Granulation Tissue to 50  Increase Granulation Tissue - Progress Progressing toward goal  Decrease Length/Width/Depth by (cm) 2  Improve Drainage Characteristics Min  Improve Drainage Characteristics - Progress Goal set today  Patient/Family will be able to  change dressing  Additional Wound Therapy Goal perform self ROM independently  Additional Wound Therapy Goal - Progress Progressing toward goal  Goals/treatment plan/discharge plan were made with and agreed upon by patient/family Yes  Time For Goal Achievement 2 weeks  Wound Therapy - Potential for Goals Alfredo Martinez PT [phone]

## 2015-10-14 NOTE — Progress Notes (Signed)
Discharge instructions given to pt, verbalized understanding. Left the unit in stable condition. 

## 2015-10-14 NOTE — Progress Notes (Signed)
Subjective: 3 Days Post-Op Procedure(s) (LRB): IRRIGATION AND DEBRIDEMENT right upper arm (Right) Patient reports pain as currently controlled.  He denies any nausea, vomiting, fever or chills. Discussed all issues with him at length this morning in regards to his upper extremity and continued care at home in terms of wet-to-dry dressing changes. Patient is concerned in regards to the cost of the medicines. He will contact his pharmacy for pricing purposes. The patient is tolerating a regular diet, he is voiding without difficulties and positive flatus.  Objective: Vital signs in last 24 hours: Temp:  [98 F (36.7 C)-98.1 F (36.7 C)] 98.1 F (36.7 C) (11/23 0530) Pulse Rate:  [63-73] 63 (11/23 0530) Resp:  [20] 20 (11/23 0530) BP: (130-150)/(70-72) 150/72 mmHg (11/23 0530) SpO2:  [100 %] 100 % (11/23 0530)  Intake/Output from previous day: 11/22 0701 - 11/23 0700 In: 1610 [P.O.:960; IV Piggyback:650] Out: 2950 [Urine:2950] Intake/Output this shift:     Recent Labs  10/11/15 1630 10/12/15 0500 10/13/15 0642  HGB 12.0* 11.6* 11.9*    Recent Labs  10/12/15 0500 10/13/15 0642  WBC 10.1 5.5  RBC 4.02* 4.15*  HCT 35.5* 36.1*  PLT 397 343    Recent Labs  10/12/15 0500 10/13/15 0515  NA 136 136  K 4.2 4.1  CL 102 102  CO2 25 23  BUN 8 12  CREATININE 0.68 0.77  GLUCOSE 95 105*  CALCIUM 8.9 8.7*   No results for input(s): LABPT, INR in the last 72 hours. labs are reviewed at length and currently no growth to date is noted   physical exam Patient is pleasant, no acute distress, conversant and comfortable  Evaluation of the right upper extremity : Dressings are removed, wet-to-dry dressings are removed, the open areas have noted granulation tissue healthy in nature without any recurrent signs of infection or deep abscess, there is no significant drainage, there is no signs of cellulitis present  Digital range of motion is intact, radial ulnar and median nerves are  intact.  Assessment/Plan: 3 Days Post-Op Procedure(s) (LRB): IRRIGATION AND DEBRIDEMENT right upper arm (Right) We would recommend hydrotherapy once again today and insure that the patient understands how to perform wet-to-dry dressing changes at home. If financially appropriate for the patient we would recommend Cipro 500 mg 1 by mouth twice a day for 2 weeks as well as pain management in the form of oxycodone. We have discussed these issues with him at length. He will call his pharmacy to discuss price issues. If Cipro too expensive, we would recommend dual coverage in the form of Keflex and Bactrim for 2 weeks. The patient will need to perform daily wet-to-dry dressing changes, he feels very comfortable in doing so. He needs to follow-up with [place] at our office in one week for a wound check. We discussed all issues with him at length and given his wound parameters are comfortable in discharged today after hydrotherapy .  Vijay Durflinger L 10/14/2015, 9:09 AM

## 2015-10-14 NOTE — Discharge Summary (Signed)
Physician Discharge Summary  Travis Callahan [id number] DOB: 01/14/1991 DOA: 10/11/2015  PCP: No primary care provider on file.  Admit date: 10/11/2015 Discharge date: 10/14/2015  Time spent: >35 minutes  Recommendations for Outpatient Follow-up:  1. Patient will need to follow-up with his orthopedic surgeon within the next one week. 2. Will be discharged on Cipro for 2 weeks as per surgeon recommendations.   Discharge Diagnoses:  Principal Problem:   Arm abscess Active Problems:   GERD (gastroesophageal reflux disease)   Hypokalemia   Normocytic anemia   Polysubstance abuse   Tobacco abuse   Abscess of right arm   Discharge Condition: Stable  Diet recommendation: Regular diet  Filed Weights   10/12/15 0748  Weight: 72 kg (158 lb 11.7 oz)    History of present illness:  From original history of present illness: 24 y.o. male with a history of gastric ulcer perforation, GERD, tobacco abuse who presented to the emergency department with complaints of right arm abscess. Patient had presented to the ER on 10/10/2015 and was supposed to be admitted at that time however refuse admission due to priorities at home. It seems that patient had noticed a pimple type area on his right arm couple of days ago. He stated that had a whitehead type of appearance. Over the next 24 hours, he had grown very large was very red and painful. Currently he complains of throbbing pain. Patient states he taken a hot shower and then the abscess started draining  Hospital Course:  Right arm abscess - Ortho consulted and recommended the following: 3 Days Post-Op Procedure(s) (LRB): IRRIGATION AND DEBRIDEMENT right upper arm (Right) Patient reports pain as currently controlled. He denies any nausea, vomiting, fever or chills. Discussed all issues with him at length this morning in regards to his upper extremity and continued care at home in terms of wet-to-dry dressing changes. Patient is  concerned in regards to the cost of the medicines. He will contact his pharmacy for pricing purposes. The patient is tolerating a regular diet, he is voiding without difficulties and positive flatus.  Will provide prescription for Cipro and oxycodone  Procedures:  As listed above  Consultations:  Orthopedic surgery  Discharge Exam: Filed Vitals:   10/14/15 0530 10/14/15 1339  BP: 150/72 140/85  Pulse: 63 72  Temp: 98.1 F (36.7 C) 98.3 F (36.8 C)  Resp: 20 20    General: Patient in no acute distress, alert and awake Cardiovascular: No cyanosis Respiratory: No increased work of breathing, equal chest rise Musculoskeletal: Right arm wrapped in Ace wrap  Discharge Instructions   Discharge Instructions    Call MD for:  redness, tenderness, or signs of infection (pain, swelling, redness, odor or green/yellow discharge around incision site)    Complete by:  As directed      Call MD for:  severe uncontrolled pain    Complete by:  As directed      Call MD for:  temperature >100.4    Complete by:  As directed      Diet - low sodium heart healthy    Complete by:  As directed      Discharge instructions    Complete by:  As directed   F/u with your surgeon Dr. Jenean Lindau in 1 weeks after discharge. Please call their office to confirm appointment after discharge.     Driving Restrictions    Complete by:  As directed   May not drive while on oxycodone     Increase activity  slowly    Complete by:  As directed           Current Discharge Medication List    START taking these medications   Details  ciprofloxacin (CIPRO) 500 MG tablet Take 1 tablet (500 mg total) by mouth 2 (two) times daily. Qty: 28 tablet, Refills: 0    docusate sodium (COLACE) 100 MG capsule Take 1 capsule (100 mg total) by mouth 2 (two) times daily. Qty: 10 capsule, Refills: 0    oxyCODONE (OXY IR/ROXICODONE) 5 MG immediate release tablet Take 1-2 tablets (5-10 mg total) by mouth every 3 (three) hours as  needed for moderate pain. Qty: 30 tablet, Refills: 0      CONTINUE these medications which have NOT CHANGED   Details  acetaminophen (TYLENOL) 500 MG tablet Take 1,000 mg by mouth every 6 (six) hours as needed for moderate pain.    ondansetron (ZOFRAN-ODT) 4 MG disintegrating tablet Take 4 mg by mouth every 8 (eight) hours as needed for nausea or vomiting.    pantoprazole (PROTONIX) 40 MG tablet Take 40 mg by mouth 2 (two) times daily.    promethazine (PHENERGAN) 25 MG suppository Place 25 mg rectally every 6 (six) hours as needed for nausea or vomiting.    promethazine (PHENERGAN) 12.5 MG tablet Take 12.5 mg by mouth every 6 (six) hours as needed for nausea or vomiting.       Allergies  Allergen Reactions  . Penicillins Anaphylaxis    Has patient had a PCN reaction causing immediate rash, facial/tongue/throat swelling, SOB or lightheadedness with hypotension: Unknown Has patient had a PCN reaction causing severe rash involving mucus membranes or skin necrosis: Unknown Has patient had a PCN reaction that required hospitalization: already in hospital when found out   Has patient had a PCN reaction occurring within the last 10 years: No  If all of the above answers are "NO", then may proceed with Cephalosporin use.   Marland Kitchen Morphine And Related Hives and Itching  . Nsaids Other (See Comments)    ulcers      The results of significant diagnostics from this hospitalization (including imaging, microbiology, ancillary and laboratory) are listed below for reference.    Significant Diagnostic Studies: Dg Elbow 2 Views Right  October 14, 2015  CLINICAL DATA:  Foreign body in soft tissues. Dr. found a needle in the patient's right arm while performing irrigation and debridement of the wound. The needle was removed. To evaluate for additional foreign bodies. EXAM: RIGHT ELBOW - 2 VIEW COMPARISON:  None. FINDINGS: Single-view obtained of the right elbow. No radiopaque soft tissue foreign bodies are  demonstrated. There is a fair amount of gas demonstrated in the soft tissues possibly indicating infection or postoperative changes due to debridement. Gas tracks into the visualized upper arm. Visualized bones appear grossly intact. IMPRESSION: No radiopaque soft tissue foreign bodies identified. Soft tissue gas collections are present. Electronically Signed   By: Lucienne Capers M.D.   On: Oct 14, 2015 23:20    Microbiology: Recent Results (from the past 240 hour(s))  Culture, blood (routine x 2)     Status: None (Preliminary result)   Collection Time: 10/10/15  3:27 AM  Result Value Ref Range Status   Specimen Description BLOOD RIGHT HAND  Final   Special Requests BOTTLES DRAWN AEROBIC AND ANAEROBIC 5ML  Final   Culture   Final    NO GROWTH 4 DAYS Performed at [facility]    Report Status PENDING  Incomplete  Culture, blood (routine  x 2)     Status: None (Preliminary result)   Collection Time: 10/10/15  3:35 AM  Result Value Ref Range Status   Specimen Description BLOOD LEFT HAND  Final   Special Requests BOTTLES DRAWN AEROBIC AND ANAEROBIC 5ML  Final   Culture   Final    NO GROWTH 4 DAYS Performed at [facility]    Report Status PENDING  Incomplete  Wound culture     Status: None   Collection Time: 10/10/15  3:46 AM  Result Value Ref Range Status   Specimen Description WOUND RIGHT ARM  Final   Special Requests Normal  Final   Gram Stain   Final    RARE WBC PRESENT, PREDOMINANTLY PMN NO SQUAMOUS EPITHELIAL CELLS SEEN NO ORGANISMS SEEN Performed at [facility]    Culture   Final    NO GROWTH 2 DAYS Performed at [facility]    Report Status 10/12/2015 FINAL  Final  Anaerobic culture     Status: None (Preliminary result)   Collection Time: 10/11/15 10:14 PM  Result Value Ref Range Status   Specimen Description WOUND RIGHT UPPER ARM  Final   Special Requests NONE  Final   Gram Stain   Final    RARE WBC PRESENT, PREDOMINANTLY  MONONUCLEAR NO SQUAMOUS EPITHELIAL CELLS SEEN NO ORGANISMS SEEN Performed at [facility]    Culture   Final    NO ANAEROBES ISOLATED; CULTURE IN PROGRESS FOR 5 DAYS Performed at [facility]    Report Status PENDING  Incomplete  Fungus Culture with Smear     Status: None (Preliminary result)   Collection Time: 10/11/15 10:14 PM  Result Value Ref Range Status   Specimen Description WOUND RIGHT UPPER ARM  Final   Special Requests NONE  Final   Fungal Smear   Final    NO YEAST OR FUNGAL ELEMENTS SEEN Performed at [facility]    Culture   Final    CULTURE IN PROGRESS FOR FOUR WEEKS Performed at [facility]    Report Status PENDING  Incomplete  Wound culture     Status: None   Collection Time: 10/11/15 10:14 PM  Result Value Ref Range Status   Specimen Description WOUND RIGHT UPPER ARM  Final   Special Requests NONE  Final   Gram Stain   Final    RARE WBC PRESENT, PREDOMINANTLY MONONUCLEAR NO SQUAMOUS EPITHELIAL CELLS SEEN NO ORGANISMS SEEN Performed at [facility]    Culture   Final    NO GROWTH 2 DAYS Performed at [facility]    Report Status 10/14/2015 FINAL  Final     Labs: Basic Metabolic Panel:  Recent Labs Lab 10/10/15 0340 10/11/15 1630 10/12/15 0500 10/13/15 0515  NA 141 141 136 136  K 4.0 3.4* 4.2 4.1  CL 107 105 102 102  CO2 22 24 25 23   GLUCOSE 85 119* 95 105*  BUN 14 10 8 12   CREATININE 0.87 0.74 0.68 0.77  CALCIUM 9.6 9.0 8.9 8.7*   Liver Function Tests:  Recent Labs Lab 10/10/15 0340 10/11/15 1630 10/12/15 0500  AST 30 25 21   ALT 24 23 22   ALKPHOS 79 82 71  BILITOT 0.6 0.4 0.6  PROT 7.7 7.3 6.4*  ALBUMIN 3.9 3.6 3.2*    Recent Labs Lab 10/11/15 1630  LIPASE 41   No results for input(s): AMMONIA in the last 168 hours. CBC:  Recent Labs Lab 10/10/15 0340 10/11/15  1630 10/12/15 0500 10/13/15 0642  WBC 9.6 7.6 10.1 5.5  NEUTROABS 6.1 4.5  --   --   HGB 11.7* 12.0*  11.6* 11.9*  HCT 34.3* 36.8* 35.5* 36.1*  MCV 85.8 87.0 88.3 87.0  PLT 429* 461* 397 343   Cardiac Enzymes: No results for input(s): CKTOTAL, CKMB, CKMBINDEX, TROPONINI in the last 168 hours. BNP: BNP (last 3 results) No results for input(s): BNP in the last 8760 hours.  ProBNP (last 3 results) No results for input(s): PROBNP in the last 8760 hours.  CBG: No results for input(s): GLUCAP in the last 168 hours.     Signed:  Velvet Bathe  [facility] 10/14/2015, 3:26 PM

## 2015-10-15 ENCOUNTER — Observation Stay (HOSPITAL_COMMUNITY)
Admission: EM | Admit: 2015-10-15 | Discharge: 2015-10-15 | Discharge status: Against Medical Advice | Payer: Self-pay | Attending: Internal Medicine | Admitting: Internal Medicine

## 2015-10-15 ENCOUNTER — Encounter (HOSPITAL_COMMUNITY): Payer: Self-pay

## 2015-10-15 ENCOUNTER — Emergency Department (HOSPITAL_BASED_OUTPATIENT_CLINIC_OR_DEPARTMENT_OTHER)
Admit: 2015-10-15 | Discharge: 2015-10-15 | Disposition: A | Discharge status: Home / Self Care | Payer: Self-pay | Attending: Emergency Medicine | Admitting: Emergency Medicine

## 2015-10-15 DIAGNOSIS — L0390 Cellulitis, unspecified: Secondary | ICD-10-CM | POA: Diagnosis present

## 2015-10-15 DIAGNOSIS — R609 Edema, unspecified: Secondary | ICD-10-CM

## 2015-10-15 DIAGNOSIS — L03113 Cellulitis of right upper limb: Secondary | ICD-10-CM | POA: Insufficient documentation

## 2015-10-15 DIAGNOSIS — F1910 Other psychoactive substance abuse, uncomplicated: Secondary | ICD-10-CM | POA: Insufficient documentation

## 2015-10-15 DIAGNOSIS — Z8614 Personal history of Methicillin resistant Staphylococcus aureus infection: Secondary | ICD-10-CM | POA: Insufficient documentation

## 2015-10-15 DIAGNOSIS — F17210 Nicotine dependence, cigarettes, uncomplicated: Secondary | ICD-10-CM | POA: Insufficient documentation

## 2015-10-15 DIAGNOSIS — M79601 Pain in right arm: Secondary | ICD-10-CM | POA: Insufficient documentation

## 2015-10-15 DIAGNOSIS — R112 Nausea with vomiting, unspecified: Principal | ICD-10-CM | POA: Insufficient documentation

## 2015-10-15 DIAGNOSIS — L02413 Cutaneous abscess of right upper limb: Secondary | ICD-10-CM | POA: Insufficient documentation

## 2015-10-15 DIAGNOSIS — Z79899 Other long term (current) drug therapy: Secondary | ICD-10-CM | POA: Insufficient documentation

## 2015-10-15 LAB — COMPREHENSIVE METABOLIC PANEL
ALBUMIN: 3.5 g/dL (ref 3.5–5.0)
ALT: 38 U/L (ref 17–63)
ANION GAP: 7 (ref 5–15)
AST: 40 U/L (ref 15–41)
Alkaline Phosphatase: 88 U/L (ref 38–126)
BUN: 13 mg/dL (ref 6–20)
CHLORIDE: 100 mmol/L — AB (ref 101–111)
CO2: 32 mmol/L (ref 22–32)
Calcium: 9.2 mg/dL (ref 8.9–10.3)
Creatinine, Ser: 0.7 mg/dL (ref 0.61–1.24)
GFR calc Af Amer: >60 mL/min (ref >60)
GFR calc non Af Amer: >60 mL/min (ref >60)
GLUCOSE: 104 mg/dL — AB (ref 65–99)
POTASSIUM: 4.5 mmol/L (ref 3.5–5.1)
SODIUM: 139 mmol/L (ref 135–145)
Total Bilirubin: 0.2 mg/dL — ABNORMAL LOW (ref 0.3–1.2)
Total Protein: 7.9 g/dL (ref 6.5–8.1)

## 2015-10-15 LAB — CBC WITH DIFFERENTIAL/PLATELET
BASOS ABS: 0 10*3/uL (ref 0.0–0.1)
Basophils Relative: 1 %
EOS PCT: 1 %
Eosinophils Absolute: 0.1 10*3/uL (ref 0.0–0.7)
HEMATOCRIT: 39.5 % (ref 39.0–52.0)
Hemoglobin: 12.9 g/dL — ABNORMAL LOW (ref 13.0–17.0)
LYMPHS ABS: 2.1 10*3/uL (ref 0.7–4.0)
LYMPHS PCT: 29 %
MCH: 28.7 pg (ref 26.0–34.0)
MCHC: 32.7 g/dL (ref 30.0–36.0)
MCV: 88 fL (ref 78.0–100.0)
MONO ABS: 0.6 10*3/uL (ref 0.1–1.0)
MONOS PCT: 8 %
NEUTROS ABS: 4.5 10*3/uL (ref 1.7–7.7)
Neutrophils Relative %: 61 %
Platelets: 429 10*3/uL — ABNORMAL HIGH (ref 150–400)
RBC: 4.49 MIL/uL (ref 4.22–5.81)
RDW: 14.6 % (ref 11.5–15.5)
WBC: 7.2 10*3/uL (ref 4.0–10.5)

## 2015-10-15 LAB — CULTURE, BLOOD (ROUTINE X 2)
CULTURE: NO GROWTH
CULTURE: NO GROWTH

## 2015-10-15 LAB — LIPASE, BLOOD: Lipase: 24 U/L (ref 11–51)

## 2015-10-15 MED ORDER — VANCOMYCIN HCL IN DEXTROSE 1-5 GM/200ML-% IV SOLN
1000.0000 mg | Freq: Three times a day (TID) | INTRAVENOUS | Status: DC
  Filled 2015-10-15: qty 200

## 2015-10-15 MED ORDER — FENTANYL CITRATE (PF) 100 MCG/2ML IJ SOLN
50.0000 ug | Freq: Once | INTRAMUSCULAR | Status: AC
  Administered 2015-10-15: 50 ug via INTRAVENOUS
  Filled 2015-10-15: qty 2

## 2015-10-15 MED ORDER — SODIUM CHLORIDE 0.9 % IV BOLUS (SEPSIS)
1000.0000 mL | Freq: Once | INTRAVENOUS | Status: AC
  Administered 2015-10-15: 1000 mL via INTRAVENOUS

## 2015-10-15 MED ORDER — ACETAMINOPHEN 325 MG PO TABS
650.0000 mg | ORAL_TABLET | Freq: Four times a day (QID) | ORAL | Status: DC | PRN
  Administered 2015-10-15: 650 mg via ORAL
  Filled 2015-10-15: qty 2

## 2015-10-15 MED ORDER — ENOXAPARIN SODIUM 40 MG/0.4ML ~~LOC~~ SOLN
40.0000 mg | SUBCUTANEOUS | Status: DC
  Filled 2015-10-15: qty 0.4

## 2015-10-15 MED ORDER — VANCOMYCIN HCL IN DEXTROSE 1-5 GM/200ML-% IV SOLN: 1000.0000 mg | Freq: Once | INTRAVENOUS | Status: DC

## 2015-10-15 MED ORDER — ONDANSETRON HCL 4 MG/2ML IJ SOLN
4.0000 mg | Freq: Four times a day (QID) | INTRAMUSCULAR | Status: DC | PRN
  Administered 2015-10-15: 4 mg via INTRAVENOUS
  Filled 2015-10-15: qty 2

## 2015-10-15 MED ORDER — VANCOMYCIN HCL IN DEXTROSE 1-5 GM/200ML-% IV SOLN
1000.0000 mg | Freq: Once | INTRAVENOUS | Status: AC
  Administered 2015-10-15: 1000 mg via INTRAVENOUS
  Filled 2015-10-15: qty 200

## 2015-10-15 MED ORDER — SODIUM CHLORIDE 0.9 % IV SOLN
INTRAVENOUS | Status: DC
Start: 2015-10-15 — End: 2015-10-15
  Administered 2015-10-15: 15:00:00 via INTRAVENOUS

## 2015-10-15 MED ORDER — ONDANSETRON HCL 4 MG/2ML IJ SOLN
4.0000 mg | Freq: Once | INTRAMUSCULAR | Status: AC
  Administered 2015-10-15: 4 mg via INTRAVENOUS
  Filled 2015-10-15: qty 2

## 2015-10-15 MED ORDER — ONDANSETRON HCL 4 MG PO TABS: 4.0000 mg | ORAL_TABLET | Freq: Four times a day (QID) | ORAL | Status: DC | PRN

## 2015-10-15 MED ORDER — HYDROMORPHONE HCL 1 MG/ML IJ SOLN
1.0000 mg | INTRAMUSCULAR | Status: DC | PRN
  Administered 2015-10-15: 1 mg via INTRAVENOUS
  Filled 2015-10-15: qty 1

## 2015-10-15 MED ORDER — ONDANSETRON HCL 4 MG/2ML IJ SOLN: 4.0000 mg | Freq: Three times a day (TID) | INTRAMUSCULAR | Status: DC | PRN

## 2015-10-15 MED ORDER — HYDROMORPHONE HCL 1 MG/ML IJ SOLN
1.0000 mg | Freq: Once | INTRAMUSCULAR | Status: AC
  Administered 2015-10-15: 1 mg via INTRAVENOUS
  Filled 2015-10-15: qty 1

## 2015-10-15 MED ORDER — ACETAMINOPHEN 650 MG RE SUPP: 650.0000 mg | Freq: Four times a day (QID) | RECTAL | Status: DC | PRN

## 2015-10-15 MED ORDER — PANTOPRAZOLE SODIUM 40 MG IV SOLR: 40.0000 mg | INTRAVENOUS | Status: DC

## 2015-10-15 NOTE — ED Notes (Addendum)
Pt reports pain has decreased and asked for crackers.  Crackers and Sprite provided.

## 2015-10-15 NOTE — Progress Notes (Signed)
Pt left AMA at this time. MD aware.

## 2015-10-15 NOTE — Progress Notes (Signed)
VASCULAR LAB PRELIMINARY  PRELIMINARY  PRELIMINARY  PRELIMINARY  Right upper extremity venous duplex completed.    Preliminary report:  There is no DVT or SVT noted in the right upper extremity.   Dalina Samara, RVT 10/15/2015, 12:01 PM

## 2015-10-15 NOTE — ED Notes (Signed)
Attempted calling [facility] for report. RN will return phone call promptly.

## 2015-10-15 NOTE — ED Notes (Signed)
Pt provided Sprite per Vanita Panda MD.

## 2015-10-15 NOTE — H&P (Signed)
[facility] History and Physical  Abigail Butts [id number] DOB: June 25, 1991 DOA: 10/15/2015  Referring physician: EDP PCP: No primary care provider on file.   Chief Complaint: Nausea and vomiting  HPI: Abigail Butts is a 24 y.o. male with past medical history of MRSA infection, peritonitis and gastric ulcer, came into the hospital complaining about inability to tolerate oral antibiotics. Patient discharged from the hospital one day prior to admission (on 10/14/2015) after he spent 3 days for treatment of right arm cellulitis/abscess and debrided by Dr. Amedeo Plenty. After patient discharged home he said he took his medication twice and twice he vomiting these medicines. In the ED lab works showed normal WBC, electrolytes and BUN/creatinine. Patient appears nonseptic, will keep observation overnight.  Review of Systems:  Constitutional: negative for anorexia, fevers and sweats Eyes: negative for irritation, redness and visual disturbance Ears, nose, mouth, throat, and face: negative for earaches, epistaxis, nasal congestion and sore throat Respiratory: negative for cough, dyspnea on exertion, sputum and wheezing Cardiovascular: negative for chest pain, dyspnea, lower extremity edema, orthopnea, palpitations and syncope Gastrointestinal: Has nausea and vomiting per history of present illness Genitourinary:negative for dysuria, frequency and hematuria Hematologic/lymphatic: negative for bleeding, easy bruising and lymphadenopathy Musculoskeletal:negative for arthralgias, muscle weakness and stiff joints Neurological: negative for coordination problems, gait problems, headaches and weakness Endocrine: negative for diabetic symptoms including polydipsia, polyuria and weight loss Allergic/Immunologic: negative for anaphylaxis, hay fever and urticaria  Past Medical History  Diagnosis Date  . MRSA (methicillin resistant Staphylococcus aureus)   . Peritonitis ([place])   . Gastric  ulcer    Past Surgical History  Procedure Laterality Date  . Gastric ulcer perforation      repair   . Incision and drainage of wound Right 10/11/2015    Procedure: IRRIGATION AND DEBRIDEMENT right upper arm;  Surgeon: Roseanne Kaufman, MD;  Location: [facility];  Service: Orthopedics;  Laterality: Right;   Social History:   reports that he has been smoking Cigarettes.  He has been smoking about 0.50 packs per day. He does not have any smokeless tobacco history on file. He reports that he drinks alcohol. He reports that he uses illicit drugs (Marijuana).  Allergies  Allergen Reactions  . Penicillins Anaphylaxis    Has patient had a PCN reaction causing immediate rash, facial/tongue/throat swelling, SOB or lightheadedness with hypotension: Unknown Has patient had a PCN reaction causing severe rash involving mucus membranes or skin necrosis: Unknown Has patient had a PCN reaction that required hospitalization: already in hospital when found out   Has patient had a PCN reaction occurring within the last 10 years: No  If all of the above answers are "NO", then may proceed with Cephalosporin use.   Marland Kitchen Morphine And Related Hives and Itching  . Nsaids Other (See Comments)    ulcers    Family history: Mother has CHF  Prior to Admission medications   Medication Sig Start Date End Date Taking? Authorizing Provider  acetaminophen (TYLENOL) 500 MG tablet Take 1,000 mg by mouth every 6 (six) hours as needed for moderate pain.   Yes Historical Provider, MD  ciprofloxacin (CIPRO) 500 MG tablet Take 1 tablet (500 mg total) by mouth 2 (two) times daily. Patient taking differently: Take 500 mg by mouth 2 (two) times daily. For 14 days 11-23 to 12-07 10/14/15  Yes Velvet Bathe, MD  docusate sodium (COLACE) 100 MG capsule Take 1 capsule (100 mg total) by mouth 2 (two) times daily. 10/14/15  Yes Velvet Bathe, MD  ondansetron (  ZOFRAN-ODT) 4 MG disintegrating tablet Take 4 mg by mouth every 8 (eight) hours as  needed for nausea or vomiting.   Yes Historical Provider, MD  oxyCODONE (OXY IR/ROXICODONE) 5 MG immediate release tablet Take 1-2 tablets (5-10 mg total) by mouth every 3 (three) hours as needed for moderate pain. 10/14/15  Yes Velvet Bathe, MD  pantoprazole (PROTONIX) 40 MG tablet Take 40 mg by mouth 2 (two) times daily.   Yes Historical Provider, MD  promethazine (PHENERGAN) 12.5 MG tablet Take 12.5 mg by mouth every 6 (six) hours as needed for nausea or vomiting.   Yes Historical Provider, MD  promethazine (PHENERGAN) 25 MG suppository Place 25 mg rectally every 6 (six) hours as needed for nausea or vomiting.   Yes Historical Provider, MD   Physical Exam: Filed Vitals:   10/15/15 1218 10/15/15 1319  BP: 122/59 142/98  Pulse: 73 86  Temp:    Resp: 19 16   Constitutional: Oriented to person, place, and time. Well-developed and well-nourished. Cooperative.  Head: Normocephalic and atraumatic.  Nose: Nose normal.  Mouth/Throat: Uvula is midline, oropharynx is clear and moist and mucous membranes are normal.  Eyes: Conjunctivae and EOM are normal. Pupils are equal, round, and reactive to light.  Neck: Trachea normal and normal range of motion. Neck supple.  Cardiovascular: Normal rate, regular rhythm, S1 normal, S2 normal, normal heart sounds and intact distal pulses.   Pulmonary/Chest: Effort normal and breath sounds normal.  Abdominal: Soft. Bowel sounds are normal. There is no hepatosplenomegaly. There is no tenderness.  Musculoskeletal: Normal range of motion.  Neurological: Alert and oriented to person, place, and time. Has normal strength. No cranial nerve deficit or sensory deficit.  Skin: Skin is warm, dry and intact.  Psychiatric: Has a normal mood and affect. Speech is normal and behavior is normal.   Labs on Admission:  Basic Metabolic Panel:  Recent Labs Lab 10/10/15 0340 10/11/15 1630 10/12/15 0500 10/13/15 0515 10/15/15 0902  NA 141 141 136 136 139  K 4.0 3.4* 4.2  4.1 4.5  CL 107 105 102 102 100*  CO2 22 24 25 23  32  GLUCOSE 85 119* 95 105* 104*  BUN 14 10 8 12 13   CREATININE 0.87 0.74 0.68 0.77 0.70  CALCIUM 9.6 9.0 8.9 8.7* 9.2   Liver Function Tests:  Recent Labs Lab 10/10/15 0340 10/11/15 1630 10/12/15 0500 10/15/15 0902  AST 30 25 21  40  ALT 24 23 22  38  ALKPHOS 79 82 71 88  BILITOT 0.6 0.4 0.6 0.2*  PROT 7.7 7.3 6.4* 7.9  ALBUMIN 3.9 3.6 3.2* 3.5    Recent Labs Lab 10/11/15 1630 10/15/15 0902  LIPASE 41 24   No results for input(s): AMMONIA in the last 168 hours. CBC:  Recent Labs Lab 10/10/15 0340 10/11/15 1630 10/12/15 0500 10/13/15 0642 10/15/15 0902  WBC 9.6 7.6 10.1 5.5 7.2  NEUTROABS 6.1 4.5  --   --  4.5  HGB 11.7* 12.0* 11.6* 11.9* 12.9*  HCT 34.3* 36.8* 35.5* 36.1* 39.5  MCV 85.8 87.0 88.3 87.0 88.0  PLT 429* 461* 397 343 429*   Cardiac Enzymes: No results for input(s): CKTOTAL, CKMB, CKMBINDEX, TROPONINI in the last 168 hours.  BNP (last 3 results) No results for input(s): BNP in the last 8760 hours.  ProBNP (last 3 results) No results for input(s): PROBNP in the last 8760 hours.  CBG: No results for input(s): GLUCAP in the last 168 hours.  Radiological Exams on Admission: No results  found.  EKG: No EKG  Assessment/Plan Principal Problem:   Nausea and vomiting Active Problems:   Cellulitis   Polysubstance abuse   Abscess of arm, right    Nausea and vomiting Presented with over 3 episodes of nausea and vomiting without clinical or biochemical evidence of dehydration. Unable to tolerate his antibiotics, started on vancomycin. Switch as soon as possible to oral doxycycline, hopefully can watch him to take 1 or 2 doses in the hospital. Nausea and vomiting could be secondary to Cipro, discontinued. Symptomatic management with Zofran.  Cellulitis/abscess Patient has significant deep abscess with necrotic tissue, status post debridement. Continue wet-to-dry dressing and antibiotics.  Started on vancomycin.  Polysubstance abuse Patient admits that he smokes cigarettes and marijuana, his UDS positive for amphetamines, opiates and THC. Patient counseled extensively.  Code Status: Full code Family Communication: Plan discussed with the patient Disposition Plan: Observation  Time spent: 50 minutes  Fantasy Donald A, MD [facility] Pager [phone]

## 2015-10-15 NOTE — ED Provider Notes (Signed)
CSN: [id number]     Arrival date & time 10/15/15  [id number] History   First MD Initiated Contact with Patient 10/15/15 [phone]     Chief Complaint  Patient presents with  . Post-op Problem  . Numbness  . Emesis     (Consider location/radiation/quality/duration/timing/severity/associated sxs/prior Treatment) HPI Patient presents one day after being discharged, now with concern of increasing pain, dysesthesia, nausea, vomiting. 5 days ago the patient had incision and drainage of a right mid arm abscess. Patient returned the following day, was admitted. He has been receiving IV antibiotics, and until several hours ago, was doing generally well. Since that time he has had increasing pain throughout the right arm, dysesthesia throughout the right arm, persistent nausea, multiple episodes of vomiting. No report of new fever. Patient has been unable to tolerate oral medications, including antibiotics. No relief with anything.  Past Medical History  Diagnosis Date  . MRSA (methicillin resistant Staphylococcus aureus)   . Peritonitis ([place])   . Gastric ulcer    Past Surgical History  Procedure Laterality Date  . Gastric ulcer perforation      repair   . Incision and drainage of wound Right 10/11/2015    Procedure: IRRIGATION AND DEBRIDEMENT right upper arm;  Surgeon: Roseanne Kaufman, MD;  Location: [facility];  Service: Orthopedics;  Laterality: Right;   No family history on file. Social History  Substance Use Topics  . Smoking status: Current Every Day Smoker -- 0.50 packs/day    Types: Cigarettes  . Smokeless tobacco: None  . Alcohol Use: Yes     Comment: socially    Review of Systems  Constitutional:       Per HPI, otherwise negative  HENT:       Per HPI, otherwise negative  Respiratory:       Per HPI, otherwise negative  Cardiovascular:       Per HPI, otherwise negative  Gastrointestinal: Negative for vomiting.  Endocrine:       Negative aside from HPI  Genitourinary:        Neg aside from HPI   Musculoskeletal:       Per HPI, otherwise negative  Skin: Positive for color change and wound.  Neurological: Negative for syncope.      Allergies  Penicillins; Morphine and related; and Nsaids  Home Medications   Prior to Admission medications   Medication Sig Start Date End Date Taking? Authorizing Provider  acetaminophen (TYLENOL) 500 MG tablet Take 1,000 mg by mouth every 6 (six) hours as needed for moderate pain.   Yes Historical Provider, MD  ciprofloxacin (CIPRO) 500 MG tablet Take 1 tablet (500 mg total) by mouth 2 (two) times daily. Patient taking differently: Take 500 mg by mouth 2 (two) times daily. For 14 days 11-23 to 12-07 10/14/15  Yes Velvet Bathe, MD  docusate sodium (COLACE) 100 MG capsule Take 1 capsule (100 mg total) by mouth 2 (two) times daily. 10/14/15  Yes Velvet Bathe, MD  ondansetron (ZOFRAN-ODT) 4 MG disintegrating tablet Take 4 mg by mouth every 8 (eight) hours as needed for nausea or vomiting.   Yes Historical Provider, MD  oxyCODONE (OXY IR/ROXICODONE) 5 MG immediate release tablet Take 1-2 tablets (5-10 mg total) by mouth every 3 (three) hours as needed for moderate pain. 10/14/15  Yes Velvet Bathe, MD  pantoprazole (PROTONIX) 40 MG tablet Take 40 mg by mouth 2 (two) times daily.   Yes Historical Provider, MD  promethazine (PHENERGAN) 12.5 MG tablet Take 12.5 mg by  mouth every 6 (six) hours as needed for nausea or vomiting.   Yes Historical Provider, MD  promethazine (PHENERGAN) 25 MG suppository Place 25 mg rectally every 6 (six) hours as needed for nausea or vomiting.   Yes Historical Provider, MD   BP 122/59 mmHg  Pulse 73  Temp(Src) 98.3 F (36.8 C) (Oral)  Resp 19  SpO2 100% Physical Exam  Constitutional: He is oriented to person, place, and time. He appears well-developed. No distress.  HENT:  Head: Normocephalic and atraumatic.  Eyes: Conjunctivae and EOM are normal.  Cardiovascular: Normal rate and regular rhythm.    Pulmonary/Chest: Effort normal. No stridor. No respiratory distress.  Abdominal: He exhibits no distension.  Musculoskeletal: He exhibits no edema.  Neurological: He is alert and oriented to person, place, and time.  Skin: Skin is warm and dry.  Wound on R elbow anteriorly w four stiches in place, mild surrounding erythema.  Induration throughout the mid anterior arm.  Psychiatric: He has a normal mood and affect.  Nursing note and vitals reviewed.   ED Course  Procedures (including critical care time) Labs Review Labs Reviewed  COMPREHENSIVE METABOLIC PANEL - Abnormal; Notable for the following:    Chloride 100 (*)    Glucose, Bld 104 (*)    Total Bilirubin 0.2 (*)    All other components within normal limits  CBC WITH DIFFERENTIAL/PLATELET - Abnormal; Notable for the following:    Hemoglobin 12.9 (*)    Platelets 429 (*)    All other components within normal limits  CULTURE, BLOOD (ROUTINE X 2)  CULTURE, BLOOD (ROUTINE X 2)  LIPASE, BLOOD    Imaging Review Patient is negative for the the teeth.  I have personally reviewed and evaluated these images and lab results as part of my medical decision-making.   EKG Interpretation None     12:28 PM Patient attempted to eat, but immediately vomited  12:28 PM Patient continues to have nausea. As we discussed all results. MDM   Final diagnoses:  Swelling   cellulitis  Patient presents one day after being discharged following admission for cellulitis, abscess. Patient is intolerant of home medication, has persistent pain, swelling about the wound site, requiring IV antibiotics. Patient admitted for continued therapy, further monitoring.  Carmin Muskrat, MD 10/15/15 1229

## 2015-10-15 NOTE — Discharge Summary (Signed)
Physician Discharge Summary  Travis Callahan [id number] DOB: [birth year] DOA: 10/15/2015  PCP: No primary care provider on file.  Admit date: 10/15/2015 Discharge date: 10/15/2015  Time spent: 15 minutes  Recommendations for Outpatient Follow-up:  1. Patient left AMA   Discharge Diagnoses:  Principal Problem:   Nausea and vomiting Active Problems:   Cellulitis   Polysubstance abuse   Abscess of arm, right   Discharge Condition: left AMA  Diet recommendation: Regular  Filed Weights   10/15/15 1420  Weight: 72.576 kg (160 lb)    History of present illness:  Travis Callahan is a 24 y.o. male with past medical history of MRSA infection, peritonitis and gastric ulcer, came into the hospital complaining about inability to tolerate oral antibiotics. Patient discharged from the hospital one day prior to admission (on 10/14/2015) after he spent 3 days for treatment of right arm cellulitis/abscess and debrided by Dr. Amedeo Plenty. After patient discharged home he said he took his medication twice and twice he vomiting these medicines. In the ED lab works showed normal WBC, electrolytes and BUN/creatinine. Patient appears nontoxic, will keep observation overnight.   Hospital Course:   Admitted and started on IV antibiotics, he left against medical advice.  Procedures:  None  Consultations:  None  Discharge Exam: Filed Vitals:   10/15/15 1356 10/15/15 1418  BP: 127/78 130/74  Pulse: 76 76  Temp:  98.6 F (37 C)  Resp: 16 20   General: Alert and awake, oriented x3, not in any acute distress. HEENT: anicteric sclera, pupils reactive to light and accommodation, EOMI CVS: S1-S2 clear, no murmur rubs or gallops Chest: clear to auscultation bilaterally, no wheezing, rales or rhonchi Abdomen: soft nontender, nondistended, normal bowel sounds, no organomegaly Extremities: no cyanosis, clubbing or edema noted bilaterally Neuro: Cranial nerves II-XII intact, no focal  neurological deficits  Discharge Instructions    Discharge Medication List as of 10/15/2015  4:01 PM    CONTINUE these medications which have NOT CHANGED   Details  acetaminophen (TYLENOL) 500 MG tablet Take 1,000 mg by mouth every 6 (six) hours as needed for moderate pain., Until Discontinued, Historical Med    ciprofloxacin (CIPRO) 500 MG tablet Take 1 tablet (500 mg total) by mouth 2 (two) times daily., Starting 10/14/2015, Until Discontinued, Print    docusate sodium (COLACE) 100 MG capsule Take 1 capsule (100 mg total) by mouth 2 (two) times daily., Starting 10/14/2015, Until Discontinued, Normal    ondansetron (ZOFRAN-ODT) 4 MG disintegrating tablet Take 4 mg by mouth every 8 (eight) hours as needed for nausea or vomiting., Until Discontinued, Historical Med    oxyCODONE (OXY IR/ROXICODONE) 5 MG immediate release tablet Take 1-2 tablets (5-10 mg total) by mouth every 3 (three) hours as needed for moderate pain., Starting 10/14/2015, Until Discontinued, Print    pantoprazole (PROTONIX) 40 MG tablet Take 40 mg by mouth 2 (two) times daily., Until Discontinued, Historical Med    promethazine (PHENERGAN) 12.5 MG tablet Take 12.5 mg by mouth every 6 (six) hours as needed for nausea or vomiting., Until Discontinued, Historical Med    promethazine (PHENERGAN) 25 MG suppository Place 25 mg rectally every 6 (six) hours as needed for nausea or vomiting., Until Discontinued, Historical Med       Allergies  Allergen Reactions  . Penicillins Anaphylaxis    Has patient had a PCN reaction causing immediate rash, facial/tongue/throat swelling, SOB or lightheadedness with hypotension: Unknown Has patient had a PCN reaction causing severe rash involving mucus membranes or skin  necrosis: Unknown Has patient had a PCN reaction that required hospitalization: already in hospital when found out   Has patient had a PCN reaction occurring within the last 10 years: No  If all of the above answers are  "NO", then may proceed with Cephalosporin use.   Marland Kitchen Morphine And Related Hives and Itching  . Nsaids Other (See Comments)    ulcers      The results of significant diagnostics from this hospitalization (including imaging, microbiology, ancillary and laboratory) are listed below for reference.    Significant Diagnostic Studies: Dg Elbow 2 Views Right  10-31-2015  CLINICAL DATA:  Foreign body in soft tissues. Dr. found a needle in the patient's right arm while performing irrigation and debridement of the wound. The needle was removed. To evaluate for additional foreign bodies. EXAM: RIGHT ELBOW - 2 VIEW COMPARISON:  None. FINDINGS: Single-view obtained of the right elbow. No radiopaque soft tissue foreign bodies are demonstrated. There is a fair amount of gas demonstrated in the soft tissues possibly indicating infection or postoperative changes due to debridement. Gas tracks into the visualized upper arm. Visualized bones appear grossly intact. IMPRESSION: No radiopaque soft tissue foreign bodies identified. Soft tissue gas collections are present. Electronically Signed   By: Lucienne Capers M.D.   On: 10/31/2015 23:20    Microbiology: Recent Results (from the past 240 hour(s))  Culture, blood (routine x 2)     Status: None   Collection Time: 10/10/15  3:27 AM  Result Value Ref Range Status   Specimen Description BLOOD RIGHT HAND  Final   Special Requests BOTTLES DRAWN AEROBIC AND ANAEROBIC 5ML  Final   Culture   Final    NO GROWTH 5 DAYS Performed at [facility]    Report Status 10/15/2015 FINAL  Final  Culture, blood (routine x 2)     Status: None   Collection Time: 10/10/15  3:35 AM  Result Value Ref Range Status   Specimen Description BLOOD LEFT HAND  Final   Special Requests BOTTLES DRAWN AEROBIC AND ANAEROBIC 5ML  Final   Culture   Final    NO GROWTH 5 DAYS Performed at [facility]    Report Status 10/15/2015 FINAL  Final  Wound culture     Status: None    Collection Time: 10/10/15  3:46 AM  Result Value Ref Range Status   Specimen Description WOUND RIGHT ARM  Final   Special Requests Normal  Final   Gram Stain   Final    RARE WBC PRESENT, PREDOMINANTLY PMN NO SQUAMOUS EPITHELIAL CELLS SEEN NO ORGANISMS SEEN Performed at [facility]    Culture   Final    NO GROWTH 2 DAYS Performed at [facility]    Report Status 10/12/2015 FINAL  Final  Anaerobic culture     Status: None (Preliminary result)   Collection Time: 10-31-2015 10:14 PM  Result Value Ref Range Status   Specimen Description WOUND RIGHT UPPER ARM  Final   Special Requests NONE  Final   Gram Stain   Final    RARE WBC PRESENT, PREDOMINANTLY MONONUCLEAR NO SQUAMOUS EPITHELIAL CELLS SEEN NO ORGANISMS SEEN Performed at [facility]    Culture   Final    NO ANAEROBES ISOLATED; CULTURE IN PROGRESS FOR 5 DAYS Performed at [facility]    Report Status PENDING  Incomplete  Fungus Culture with Smear     Status: None (Preliminary result)   Collection Time: 2015/10/31 10:14  PM  Result Value Ref Range Status   Specimen Description WOUND RIGHT UPPER ARM  Final   Special Requests NONE  Final   Fungal Smear   Final    NO YEAST OR FUNGAL ELEMENTS SEEN Performed at [facility]    Culture   Final    CULTURE IN PROGRESS FOR FOUR WEEKS Performed at [facility]    Report Status PENDING  Incomplete  Wound culture     Status: None   Collection Time: 10/11/15 10:14 PM  Result Value Ref Range Status   Specimen Description WOUND RIGHT UPPER ARM  Final   Special Requests NONE  Final   Gram Stain   Final    RARE WBC PRESENT, PREDOMINANTLY MONONUCLEAR NO SQUAMOUS EPITHELIAL CELLS SEEN NO ORGANISMS SEEN Performed at [facility]    Culture   Final    NO GROWTH 2 DAYS Performed at [facility]    Report Status 10/14/2015 FINAL  Final  Culture, blood (routine x 2)     Status: None (Preliminary result)   Collection  Time: 10/15/15  8:42 AM  Result Value Ref Range Status   Specimen Description BLOOD LEFT ANTECUBITAL  Final   Special Requests BOTTLES DRAWN AEROBIC AND ANAEROBIC 5CC  Final   Culture PENDING  Incomplete   Report Status PENDING  Incomplete  Culture, blood (routine x 2)     Status: None (Preliminary result)   Collection Time: 10/15/15  9:02 AM  Result Value Ref Range Status   Specimen Description BLOOD LEFT FOREARM  Final   Special Requests IN PEDIATRIC BOTTLE [facility]  Final   Culture PENDING  Incomplete   Report Status PENDING  Incomplete     Labs: Basic Metabolic Panel:  Recent Labs Lab 10/10/15 0340 10/11/15 1630 10/12/15 0500 10/13/15 0515 10/15/15 0902  NA 141 141 136 136 139  K 4.0 3.4* 4.2 4.1 4.5  CL 107 105 102 102 100*  CO2 22 24 25 23  32  GLUCOSE 85 119* 95 105* 104*  BUN 14 10 8 12 13   CREATININE 0.87 0.74 0.68 0.77 0.70  CALCIUM 9.6 9.0 8.9 8.7* 9.2   Liver Function Tests:  Recent Labs Lab 10/10/15 0340 10/11/15 1630 10/12/15 0500 10/15/15 0902  AST 30 25 21  40  ALT 24 23 22  38  ALKPHOS 79 82 71 88  BILITOT 0.6 0.4 0.6 0.2*  PROT 7.7 7.3 6.4* 7.9  ALBUMIN 3.9 3.6 3.2* 3.5    Recent Labs Lab 10/11/15 1630 10/15/15 0902  LIPASE 41 24   No results for input(s): AMMONIA in the last 168 hours. CBC:  Recent Labs Lab 10/10/15 0340 10/11/15 1630 10/12/15 0500 10/13/15 0642 10/15/15 0902  WBC 9.6 7.6 10.1 5.5 7.2  NEUTROABS 6.1 4.5  --   --  4.5  HGB 11.7* 12.0* 11.6* 11.9* 12.9*  HCT 34.3* 36.8* 35.5* 36.1* 39.5  MCV 85.8 87.0 88.3 87.0 88.0  PLT 429* 461* 397 343 429*   Cardiac Enzymes: No results for input(s): CKTOTAL, CKMB, CKMBINDEX, TROPONINI in the last 168 hours. BNP: BNP (last 3 results) No results for input(s): BNP in the last 8760 hours.  ProBNP (last 3 results) No results for input(s): PROBNP in the last 8760 hours.  CBG: No results for input(s): GLUCAP in the last 168 hours.     Signed:  Milayah Krell A  [facility] 10/15/2015, 6:18 PM

## 2015-10-15 NOTE — ED Notes (Signed)
Pt reports pain decreased from 7/10 to 6/10.  Sts "I know you said that Fentanyl is stronger than Dilaudid, but I think, dilaudid works better for me."  Will notify EDP.

## 2015-10-15 NOTE — Progress Notes (Addendum)
ANTIBIOTIC CONSULT NOTE - INITIAL  Pharmacy Consult for vancomycin Indication: cellulitis  Allergies  Allergen Reactions  . Penicillins Anaphylaxis    Has patient had a PCN reaction causing immediate rash, facial/tongue/throat swelling, SOB or lightheadedness with hypotension: Unknown Has patient had a PCN reaction causing severe rash involving mucus membranes or skin necrosis: Unknown Has patient had a PCN reaction that required hospitalization: already in hospital when found out   Has patient had a PCN reaction occurring within the last 10 years: No  If all of the above answers are "NO", then may proceed with Cephalosporin use.   Marland Kitchen Morphine And Related Hives and Itching  . Nsaids Other (See Comments)    ulcers    Patient Measurements:   Adjusted Body Weight:   Vital Signs: Temp: 98.6 F (37 C) (11/24 1418) Temp Source: Oral (11/24 1418) BP: 130/74 mmHg (11/24 1418) Pulse Rate: 76 (11/24 1418) Intake/Output from previous day:   Intake/Output from this shift:    Labs:  Recent Labs  10/13/15 0515 10/13/15 0642 10/15/15 0902  WBC  --  5.5 7.2  HGB  --  11.9* 12.9*  PLT  --  343 429*  CREATININE 0.77  --  0.70   Estimated Creatinine Clearance: 141.2 mL/min (by C-G formula based on Cr of 0.7).  Recent Labs  10/13/15 2000  [place] 9*     Microbiology: Recent Results (from the past 720 hour(s))  Culture, blood (routine x 2)     Status: None   Collection Time: 10/10/15  3:27 AM  Result Value Ref Range Status   Specimen Description BLOOD RIGHT HAND  Final   Special Requests BOTTLES DRAWN AEROBIC AND ANAEROBIC 5ML  Final   Culture   Final    NO GROWTH 5 DAYS Performed at [facility]    Report Status 10/15/2015 FINAL  Final  Culture, blood (routine x 2)     Status: None   Collection Time: 10/10/15  3:35 AM  Result Value Ref Range Status   Specimen Description BLOOD LEFT HAND  Final   Special Requests BOTTLES DRAWN AEROBIC AND ANAEROBIC 5ML   Final   Culture   Final    NO GROWTH 5 DAYS Performed at [facility]    Report Status 10/15/2015 FINAL  Final  Wound culture     Status: None   Collection Time: 10/10/15  3:46 AM  Result Value Ref Range Status   Specimen Description WOUND RIGHT ARM  Final   Special Requests Normal  Final   Gram Stain   Final    RARE WBC PRESENT, PREDOMINANTLY PMN NO SQUAMOUS EPITHELIAL CELLS SEEN NO ORGANISMS SEEN Performed at [facility]    Culture   Final    NO GROWTH 2 DAYS Performed at [facility]    Report Status 10/12/2015 FINAL  Final  Anaerobic culture     Status: None (Preliminary result)   Collection Time: 10/11/15 10:14 PM  Result Value Ref Range Status   Specimen Description WOUND RIGHT UPPER ARM  Final   Special Requests NONE  Final   Gram Stain   Final    RARE WBC PRESENT, PREDOMINANTLY MONONUCLEAR NO SQUAMOUS EPITHELIAL CELLS SEEN NO ORGANISMS SEEN Performed at [facility]    Culture   Final    NO ANAEROBES ISOLATED; CULTURE IN PROGRESS FOR 5 DAYS Performed at [facility]    Report Status PENDING  Incomplete  Fungus Culture with Smear     Status:  None (Preliminary result)   Collection Time: 10/11/15 10:14 PM  Result Value Ref Range Status   Specimen Description WOUND RIGHT UPPER ARM  Final   Special Requests NONE  Final   Fungal Smear   Final    NO YEAST OR FUNGAL ELEMENTS SEEN Performed at [facility]    Culture   Final    CULTURE IN PROGRESS FOR FOUR WEEKS Performed at [facility]    Report Status PENDING  Incomplete  Wound culture     Status: None   Collection Time: 10/11/15 10:14 PM  Result Value Ref Range Status   Specimen Description WOUND RIGHT UPPER ARM  Final   Special Requests NONE  Final   Gram Stain   Final    RARE WBC PRESENT, PREDOMINANTLY MONONUCLEAR NO SQUAMOUS EPITHELIAL CELLS SEEN NO ORGANISMS SEEN Performed at [facility]    Culture   Final    NO GROWTH 2  DAYS Performed at [facility]    Report Status 10/14/2015 FINAL  Final  Culture, blood (routine x 2)     Status: None (Preliminary result)   Collection Time: 10/15/15  8:42 AM  Result Value Ref Range Status   Specimen Description BLOOD LEFT ANTECUBITAL  Final   Special Requests BOTTLES DRAWN AEROBIC AND ANAEROBIC 5CC  Final   Culture PENDING  Incomplete   Report Status PENDING  Incomplete  Culture, blood (routine x 2)     Status: None (Preliminary result)   Collection Time: 10/15/15  9:02 AM  Result Value Ref Range Status   Specimen Description BLOOD LEFT FOREARM  Final   Special Requests IN PEDIATRIC BOTTLE 2CC  Final   Culture PENDING  Incomplete   Report Status PENDING  Incomplete    Medical History: Past Medical History  Diagnosis Date  . MRSA (methicillin resistant Staphylococcus aureus)   . Peritonitis ([place])   . Gastric ulcer     Assessment: 89 YOM presents with R arm numbness s/p I&D of abscess in that arm. Also c/o N/V.  He was discharged 11/23. He received vancomycin and aztreonam during that admission. Blood and wound cultures were unrevealing during that admission. He was sent home on ciprofloxacin.  Pharmacy asked to assist in vancomycin dosing  11/20 >> vanco >> 11/23 (discharge) 11/20 >> aztreonam >> 11/23 (discharge) 11/24 >> vancomycin   11/24 blood: pending  Dose changes/levels: 11/22 at 20:00 VT = 82mcg/ml on vancomycin 1500mg  q12h (prior to 4th dose) - trough delayed x 2h as unable to obtain blood specimen  Goal of Therapy:  Vancomycin trough level 10-15 mcg/ml  Plan:   Vancomycin 1000mg  IV q8h based on low trough 2 days ago on q12h dosing (noting trough may have been falsely low d/t delay in obtaining specimen)  Check steady state level  Monitor renal function  Suggest bactrim or clindamycin as discharge - less expensive options  Doreene Eland, PharmD, BCPS.   Pager: [phone] 10/15/2015 2:36 PM

## 2015-10-15 NOTE — ED Notes (Signed)
Pt called out that IV pole was "beeping."  As this RN entered the room, Pt informed this RN that he had vomited in the floor.  Empty cracker packages noted in trash can, but emesis was only clear liquid.  EDP notified.

## 2015-10-15 NOTE — ED Notes (Addendum)
Pt c/o R arm numbness and emesis starting around 0300.  Pain score 7/10.  Pt had a R arm I and D x 4 days ago and was discharged from [facility] yesterday.  Pt reports that he has been unable to keep medications down.  Wound w/ 4 sutures noted to medial R arm.  Pt sts he was unable to apply dressing this morning.

## 2015-10-20 LAB — CULTURE, BLOOD (ROUTINE X 2)
Culture: NO GROWTH
Culture: NO GROWTH

## 2015-10-28 LAB — WOUND CULTURE: Special Requests: NORMAL

## 2015-10-28 LAB — ANAEROBIC CULTURE

## 2015-11-01 ENCOUNTER — Encounter: Payer: Self-pay | Admitting: Family Medicine

## 2015-11-01 ENCOUNTER — Inpatient Hospital Stay: Admit: 2015-11-01 | Payer: Self-pay | Admitting: Orthopedic Surgery

## 2015-11-01 ENCOUNTER — Encounter (HOSPITAL_COMMUNITY): Payer: Self-pay | Admitting: Anesthesiology

## 2015-11-01 ENCOUNTER — Encounter (HOSPITAL_COMMUNITY): Payer: Self-pay | Admitting: Emergency Medicine

## 2015-11-01 ENCOUNTER — Observation Stay (HOSPITAL_COMMUNITY)
Admission: EM | Admit: 2015-11-01 | Discharge: 2015-11-01 | Discharge status: Against Medical Advice | Payer: Self-pay | Attending: Internal Medicine | Admitting: Internal Medicine

## 2015-11-01 ENCOUNTER — Emergency Department (HOSPITAL_COMMUNITY): Payer: Self-pay

## 2015-11-01 DIAGNOSIS — S41101D Unspecified open wound of right upper arm, subsequent encounter: Secondary | ICD-10-CM

## 2015-11-01 DIAGNOSIS — N179 Acute kidney failure, unspecified: Secondary | ICD-10-CM

## 2015-11-01 DIAGNOSIS — B182 Chronic viral hepatitis C: Secondary | ICD-10-CM

## 2015-11-01 DIAGNOSIS — L03119 Cellulitis of unspecified part of limb: Secondary | ICD-10-CM | POA: Diagnosis present

## 2015-11-01 DIAGNOSIS — F17200 Nicotine dependence, unspecified, uncomplicated: Secondary | ICD-10-CM | POA: Insufficient documentation

## 2015-11-01 DIAGNOSIS — L0291 Cutaneous abscess, unspecified: Secondary | ICD-10-CM | POA: Diagnosis present

## 2015-11-01 DIAGNOSIS — L02512 Cutaneous abscess of left hand: Principal | ICD-10-CM | POA: Insufficient documentation

## 2015-11-01 DIAGNOSIS — S41101A Unspecified open wound of right upper arm, initial encounter: Secondary | ICD-10-CM | POA: Diagnosis present

## 2015-11-01 DIAGNOSIS — L0390 Cellulitis, unspecified: Secondary | ICD-10-CM | POA: Diagnosis present

## 2015-11-01 DIAGNOSIS — K219 Gastro-esophageal reflux disease without esophagitis: Secondary | ICD-10-CM | POA: Insufficient documentation

## 2015-11-01 DIAGNOSIS — E876 Hypokalemia: Secondary | ICD-10-CM | POA: Insufficient documentation

## 2015-11-01 DIAGNOSIS — L03114 Cellulitis of left upper limb: Secondary | ICD-10-CM | POA: Diagnosis present

## 2015-11-01 DIAGNOSIS — F1910 Other psychoactive substance abuse, uncomplicated: Secondary | ICD-10-CM

## 2015-11-01 DIAGNOSIS — Z8614 Personal history of Methicillin resistant Staphylococcus aureus infection: Secondary | ICD-10-CM | POA: Insufficient documentation

## 2015-11-01 HISTORY — DX: Local infection of the skin and subcutaneous tissue, unspecified: L08.9

## 2015-11-01 LAB — COMPREHENSIVE METABOLIC PANEL
ALK PHOS: 77 U/L (ref 38–126)
ALT: 21 U/L (ref 17–63)
ANION GAP: 11 (ref 5–15)
AST: 19 U/L (ref 15–41)
Albumin: 3.2 g/dL — ABNORMAL LOW (ref 3.5–5.0)
BILIRUBIN TOTAL: 0.5 mg/dL (ref 0.3–1.2)
BUN: 23 mg/dL — ABNORMAL HIGH (ref 6–20)
CALCIUM: 8.7 mg/dL — AB (ref 8.9–10.3)
CO2: 20 mmol/L — ABNORMAL LOW (ref 22–32)
Chloride: 106 mmol/L (ref 101–111)
Creatinine, Ser: 1.04 mg/dL (ref 0.61–1.24)
Glucose, Bld: 117 mg/dL — ABNORMAL HIGH (ref 65–99)
POTASSIUM: 3.2 mmol/L — AB (ref 3.5–5.1)
Sodium: 137 mmol/L (ref 135–145)
TOTAL PROTEIN: 7.3 g/dL (ref 6.5–8.1)

## 2015-11-01 LAB — BASIC METABOLIC PANEL
ANION GAP: 14 (ref 5–15)
BUN: 28 mg/dL — AB (ref 6–20)
CALCIUM: 9.1 mg/dL (ref 8.9–10.3)
CO2: 17 mmol/L — AB (ref 22–32)
Chloride: 108 mmol/L (ref 101–111)
Creatinine, Ser: 1.24 mg/dL (ref 0.61–1.24)
GFR calc Af Amer: >60 mL/min (ref >60)
GLUCOSE: 125 mg/dL — AB (ref 65–99)
Potassium: 3.7 mmol/L (ref 3.5–5.1)
Sodium: 139 mmol/L (ref 135–145)

## 2015-11-01 LAB — CBC WITH DIFFERENTIAL/PLATELET
BASOS ABS: 0 10*3/uL (ref 0.0–0.1)
Basophils Relative: 0 %
EOS PCT: 1 %
Eosinophils Absolute: 0.1 10*3/uL (ref 0.0–0.7)
HEMATOCRIT: 34 % — AB (ref 39.0–52.0)
Hemoglobin: 10.8 g/dL — ABNORMAL LOW (ref 13.0–17.0)
LYMPHS ABS: 2.5 10*3/uL (ref 0.7–4.0)
LYMPHS PCT: 15 %
MCH: 27.8 pg (ref 26.0–34.0)
MCHC: 31.8 g/dL (ref 30.0–36.0)
MCV: 87.6 fL (ref 78.0–100.0)
MONO ABS: 0.9 10*3/uL (ref 0.1–1.0)
MONOS PCT: 5 %
NEUTROS ABS: 13.3 10*3/uL — AB (ref 1.7–7.7)
Neutrophils Relative %: 79 %
PLATELETS: 470 10*3/uL — AB (ref 150–400)
RBC: 3.88 MIL/uL — ABNORMAL LOW (ref 4.22–5.81)
RDW: 14.8 % (ref 11.5–15.5)
WBC: 16.9 10*3/uL — ABNORMAL HIGH (ref 4.0–10.5)

## 2015-11-01 LAB — CBC
HEMATOCRIT: 30.6 % — AB (ref 39.0–52.0)
HEMOGLOBIN: 9.9 g/dL — AB (ref 13.0–17.0)
MCH: 28.4 pg (ref 26.0–34.0)
MCHC: 32.4 g/dL (ref 30.0–36.0)
MCV: 87.7 fL (ref 78.0–100.0)
Platelets: 425 10*3/uL — ABNORMAL HIGH (ref 150–400)
RBC: 3.49 MIL/uL — ABNORMAL LOW (ref 4.22–5.81)
RDW: 14.8 % (ref 11.5–15.5)
WBC: 16 10*3/uL — ABNORMAL HIGH (ref 4.0–10.5)

## 2015-11-01 SURGERY — INCISION AND DRAINAGE
Anesthesia: Choice | Laterality: Left

## 2015-11-01 MED ORDER — SULFAMETHOXAZOLE-TRIMETHOPRIM 800-160 MG PO TABS: 2.0000 | ORAL_TABLET | Freq: Two times a day (BID) | ORAL | Status: DC

## 2015-11-01 MED ORDER — SODIUM CHLORIDE 0.9 % IV SOLN
INTRAVENOUS | Status: DC
  Administered 2015-11-01: 11:00:00 via INTRAVENOUS
  Filled 2015-11-01 (×2): qty 1000

## 2015-11-01 MED ORDER — DIPHENHYDRAMINE HCL 50 MG/ML IJ SOLN
12.5000 mg | Freq: Once | INTRAMUSCULAR | Status: AC
  Administered 2015-11-01: 12.5 mg via INTRAVENOUS
  Filled 2015-11-01: qty 1

## 2015-11-01 MED ORDER — VANCOMYCIN HCL IN DEXTROSE 1-5 GM/200ML-% IV SOLN
1000.0000 mg | Freq: Once | INTRAVENOUS | Status: AC
  Administered 2015-11-01: 1000 mg via INTRAVENOUS
  Filled 2015-11-01: qty 200

## 2015-11-01 MED ORDER — ONDANSETRON HCL 4 MG/2ML IJ SOLN: 4.0000 mg | Freq: Four times a day (QID) | INTRAMUSCULAR | Status: DC | PRN

## 2015-11-01 MED ORDER — LIDOCAINE HCL (PF) 1 % IJ SOLN
2.0000 mL | INTRAMUSCULAR | Status: AC
  Administered 2015-11-01: 2 mL

## 2015-11-01 MED ORDER — ACETAMINOPHEN 325 MG PO TABS: 650.0000 mg | ORAL_TABLET | Freq: Four times a day (QID) | ORAL | Status: DC | PRN

## 2015-11-01 MED ORDER — ONDANSETRON HCL 4 MG PO TABS
4.0000 mg | ORAL_TABLET | Freq: Four times a day (QID) | ORAL | Status: DC | PRN
Start: 2015-11-01 — End: 2015-11-01

## 2015-11-01 MED ORDER — HYDROMORPHONE HCL 1 MG/ML IJ SOLN
1.0000 mg | Freq: Once | INTRAMUSCULAR | Status: AC
Start: 2015-11-01 — End: 2015-11-01
  Administered 2015-11-01: 1 mg via INTRAVENOUS
  Filled 2015-11-01: qty 1

## 2015-11-01 MED ORDER — HYDROMORPHONE HCL 1 MG/ML IJ SOLN: 1.0000 mg | INTRAMUSCULAR | Status: DC | PRN

## 2015-11-01 MED ORDER — ONDANSETRON HCL 4 MG/2ML IJ SOLN
4.0000 mg | Freq: Once | INTRAMUSCULAR | Status: AC
  Administered 2015-11-01: 4 mg via INTRAVENOUS
  Filled 2015-11-01: qty 2

## 2015-11-01 MED ORDER — OXYCODONE-ACETAMINOPHEN 5-325 MG PO TABS
1.0000 | ORAL_TABLET | ORAL | Status: DC | PRN
  Administered 2015-11-01 (×2): 1 via ORAL
  Filled 2015-11-01 (×2): qty 1

## 2015-11-01 MED ORDER — SENNOSIDES-DOCUSATE SODIUM 8.6-50 MG PO TABS: 1.0000 | ORAL_TABLET | Freq: Every evening | ORAL | Status: DC | PRN

## 2015-11-01 MED ORDER — ENOXAPARIN SODIUM 40 MG/0.4ML ~~LOC~~ SOLN
40.0000 mg | Freq: Every day | SUBCUTANEOUS | Status: DC
  Filled 2015-11-01: qty 0.4

## 2015-11-01 MED ORDER — VANCOMYCIN HCL IN DEXTROSE 1-5 GM/200ML-% IV SOLN
1000.0000 mg | Freq: Three times a day (TID) | INTRAVENOUS | Status: DC
  Administered 2015-11-01: 1000 mg via INTRAVENOUS
  Filled 2015-11-01 (×2): qty 200

## 2015-11-01 MED ORDER — LIDOCAINE HCL (PF) 1 % IJ SOLN
INTRAMUSCULAR | Status: AC
  Filled 2015-11-01: qty 10

## 2015-11-01 MED ORDER — ACETAMINOPHEN 650 MG RE SUPP
650.0000 mg | Freq: Four times a day (QID) | RECTAL | Status: DC | PRN
Start: 2015-11-01 — End: 2015-11-01

## 2015-11-01 MED ORDER — SODIUM CHLORIDE 0.9 % IV SOLN
Freq: Once | INTRAVENOUS | Status: AC
  Administered 2015-11-01: 03:00:00 via INTRAVENOUS

## 2015-11-01 MED ORDER — HYDROMORPHONE HCL 1 MG/ML IJ SOLN
1.0000 mg | Freq: Once | INTRAMUSCULAR | Status: AC
  Administered 2015-11-01: 1 mg via INTRAVENOUS
  Filled 2015-11-01: qty 1

## 2015-11-01 NOTE — ED Notes (Signed)
Admitting physician at the bedside.

## 2015-11-01 NOTE — ED Provider Notes (Signed)
Medical screening examination/treatment/procedure(s) were conducted as a shared visit with non-physician practitioner(s) and myself.  I personally evaluated the patient during the encounter.   EKG Interpretation None      Pt is a 24 y.o. right-hand-dominant male who presents emergency department with a left hand abscess and cellulitis. He is tachycardic, reports fever of 101 at home and has a white count of 17,000. Patient has been cultured and receiving IV vancomycin. We'll need admission. He denies any IV drug use. Has previously had abscess in the left [facility] requiring surgery.  [place], DO 11/01/15 [phone]
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
## 2015-11-01 NOTE — ED Notes (Signed)
Pt is having difficulty maintaining thought process and is occasionally falls asleep during NP assessment.
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
## 2015-11-01 NOTE — Progress Notes (Signed)
PROGRESS NOTE  Travis Callahan [id number] DOB: [birth year] DOA: 11/01/2015 PCP: No PCP Per Patient  Brief History 24 year old male with a history of chronic hepatitis C, GERD, substance abuse, tobacco use presented with one-day history of left hand swelling and abscess. The patient states that he was cleaning under his refrigerator at home after which she noticed a large sore on the back of his left hand. The wound became larger with associated nausea and chills. Because his friend had a recent "recluse spider bite", the patient came to the hospital for further evaluation. Upon admission, patient was noted to have WBC 16.9. Bedside I&D was performed and sent for culture. The patient denies any illegal drug use, but he had a recent urine drug screen which showed amphetamines on 10/12/2015. Notably, the patient states that he had a recent abscess on his right arm which was debrided. He endorsed compliance with his antibiotics.    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 Fredna Dow -keep npo  AKI -baseline creatinine 0.6-0.9 -continue IVF  Hypokalemia -replete -check mag  Chronic Hep C -out pt treatment and followup  -clinically compensated  GERD  -start H2 blockade  Polysubstance abuse -counseled to stpo   Family Communication:   Pt at beside Disposition Plan:   Home when medically stable      Procedures/Studies: Dg Hand 2 View Right  11/01/2015  CLINICAL DATA:  Puncture wound about the fifth metacarpal. Foreign body. EXAM: RIGHT HAND - 2 VIEW COMPARISON:  None. FINDINGS: No fracture or dislocation. The alignment and joint spaces are maintained. No erosion, periosteal reaction or bony destructive change. No radiopaque foreign body. No soft tissue air. Mild  soft tissue prominence about the dorsum of the hand. IMPRESSION: No radiopaque foreign body or acute osseous abnormality. Electronically Signed   By: Jeb Levering M.D.   On: 11/01/2015 02:22   Dg Hand Complete Left  11/01/2015  CLINICAL DATA:  Possible injury to the dorsum of the left hand 1 week ago, with abscess across the metacarpals. Initial encounter. EXAM: LEFT HAND - COMPLETE 3+ VIEW COMPARISON:  None. FINDINGS: There is no evidence of fracture or dislocation. There is no evidence of osseous erosion. The joint spaces are preserved. The carpal rows are intact, and demonstrate normal alignment. Marked dorsal soft tissue swelling is noted. No radiopaque foreign bodies are seen. IMPRESSION: No evidence of fracture or dislocation. No evidence of osseous erosion. Marked dorsal soft tissue swelling noted. No radiopaque foreign bodies seen. Electronically Signed   By: Garald Balding M.D.   On: 11/01/2015 02:21         Subjective:  patient continues to complain of left hand pain. Denies any fevers, chills, chest pain, shortness breath, nausea, vomiting, diarrhea, abdominal pain. No fevers or chills. Denies any headache or visual disturbance.  Objective: Filed Vitals:   11/01/15 0352 11/01/15 0416 11/01/15 0455 11/01/15 0524  BP:  131/81  132/72  Pulse: 104   94  Temp:   98.8 F (37.1 C) 98.2 F (36.8 C)  TempSrc:   Oral Oral  Resp:    18  Height:    5\' 6"  (1.676 m)  Weight:    72.5 kg (159 lb 13.3 oz)  SpO2: 100%   100%   No intake or output data  in the 24 hours ending 11/01/15 0810 Weight change:  Exam:   General:  Pt is alert, follows commands appropriately, not in acute distress  HEENT: No icterus, No thrush, No neck mass, [place]/AT  Cardiovascular: RRR, S1/S2, no rubs, no gallops  Respiratory: CTA bilaterally, no wheezing, no crackles, no rhonchi  Abdomen: Soft/+BS, non tender, non distended, no guarding  Extremities: left hand edema and erythema extending into the  fingers. Upper wound on the dorsal surface of the left hand with mild amount of yellow drainage. No crepitance.   Data Reviewed: Basic Metabolic Panel:  Recent Labs Lab 11/01/15 0134 11/01/15 0545  NA 139 137  K 3.7 3.2*  CL 108 106  CO2 17* 20*  GLUCOSE 125* 117*  BUN 28* 23*  CREATININE 1.24 1.04  CALCIUM 9.1 8.7*   Liver Function Tests:  Recent Labs Lab 11/01/15 0545  AST 19  ALT 21  ALKPHOS 77  BILITOT 0.5  PROT 7.3  ALBUMIN 3.2*   No results for input(s): LIPASE, AMYLASE in the last 168 hours. No results for input(s): AMMONIA in the last 168 hours. CBC:  Recent Labs Lab 11/01/15 0134 11/01/15 0545  WBC 16.9* 16.0*  NEUTROABS 13.3*  --   HGB 10.8* 9.9*  HCT 34.0* 30.6*  MCV 87.6 87.7  PLT 470* 425*   Cardiac Enzymes: No results for input(s): CKTOTAL, CKMB, CKMBINDEX, TROPONINI in the last 168 hours. BNP: Invalid input(s): POCBNP CBG: No results for input(s): GLUCAP in the last 168 hours.  No results found for this or any previous visit (from the past 240 hour(s)).   Scheduled Meds: . enoxaparin (LOVENOX) injection  40 mg Subcutaneous Daily  . vancomycin  1,000 mg Intravenous Q8H   Continuous Infusions:    Tannisha Kennington, DO  [facility] Pager [phone]  If 7PM-7AM, please contact night-coverage [email or web address] Password [email or web address] 11/01/2015, 8:10 AM

## 2015-11-01 NOTE — Discharge Summary (Signed)
Physician Discharge Summary  Travis Callahan [id number] DOB: 1991/08/25 DOA: 11/01/2015  PCP: No PCP Per Patient  Admit date: 11/01/2015 Discharge date: 11/01/2015  PATIENT LEAVING AGAINST MEDICAL ADVICE -discussed the risks including but not limited to worsening infection, sepsis, limb loss,and death.  Pt expressed understanding and wishes to leave AMA. Discharge Diagnoses:  Left hand abscess  -Although the patient denies illegal drug use, I suspect he is using methamphetamines -10/12/15 urine drug screen with amphetamines -continue IV vanco pending culture data -Consulteded hand surgery--spoke with Dr. Fredna Dow -pt decided to leave AMA prior to evaluation by Ortho -Rx Bactrim DS, 2 tabs bid to pt's pharmacy of record -No opioids were given to the patient  AKI -baseline creatinine 0.6-0.9 -continue IVF  Hypokalemia -replete -check mag  Chronic Hep C -out pt treatment and followup  -clinically compensated  GERD  -start H2 blockade  Polysubstance abuse -counseled to stpo  Discharge Condition: AGAINST MEDICAL ADVICE  Disposition:  AGAINST MEDICAL ADVICE   Wt Readings from Last 3 Encounters:  11/01/15 72.5 kg (159 lb 13.3 oz)    History of present illness:  24 year old male with a history of chronic hepatitis C, GERD, substance abuse, tobacco use presented with one-day history of left hand swelling and abscess. The patient states that he was cleaning under his refrigerator at home after which she noticed a large sore on the back of his left hand. The wound became larger with associated nausea and chills. Because his friend had a recent "recluse spider bite", the patient came to the hospital for further evaluation. Upon admission, patient was noted to have WBC 16.9. Bedside I&D was performed and sent for culture. The patient denies any illegal drug use, but he had a recent urine drug screen which showed amphetamines on 10/12/2015. Notably, the patient states that  he had a recent abscess on his right arm which was debrided. He endorsed compliance with his antibiotics. He was recently discharged from the hospital on 10/14/2015 after irrigation and debridement performed by Dr. Amedeo Plenty on his right upper extremity. During the debridement, a foreign body was removed and pathology revealed that it was a staple or needle. In addition, the patient had neurolysis performed a release of the left median nerve.The patient was discharged home with Cipro. He endorsed compliance. Tenderness in addition, the patient was started on intravenous antibiotics. He had debridement at the bedside to perform the emergency department. Orthopedics--hand surgery was consulted. However before the patient could be evaluated by orthopedics, the patient decided to leave [place].  Consultants: Ortho-Hand--[facility]  Discharge Exam: Filed Vitals:   11/01/15 0455 11/01/15 0524  BP:  132/72  Pulse:  94  Temp: 98.8 F (37.1 C) 98.2 F (36.8 C)  Resp:  18   Filed Vitals:   11/01/15 0352 11/01/15 0416 11/01/15 0455 11/01/15 0524  BP:  131/81  132/72  Pulse: 104   94  Temp:   98.8 F (37.1 C) 98.2 F (36.8 C)  TempSrc:   Oral Oral  Resp:    18  Height:    5\' 6"  (1.676 m)  Weight:    72.5 kg (159 lb 13.3 oz)  SpO2: 100%   100%   General: A&O x 3, NAD, pleasant, cooperative Cardiovascular: RRR, no rub, no gallop, no S3 Respiratory: CTAB, no wheeze, no rhonchi Extremities: No edema, No lymphangitis, no petechiae; hand edema and erythema on the dorsal surface without crepitance. Mild yellow drainage expressed.  Discharge Instructions     Medication List  TAKE these medications        ondansetron 4 MG disintegrating tablet  Commonly known as:  ZOFRAN-ODT  Take 4 mg by mouth every 8 (eight) hours as needed for nausea or vomiting.     oxyCODONE 5 MG immediate release tablet  Commonly known as:  Oxy IR/ROXICODONE  Take 5-10 mg by mouth every 4 (four) hours as  needed for severe pain.     pantoprazole 40 MG tablet  Commonly known as:  PROTONIX  Take 40 mg by mouth 2 (two) times daily.     promethazine 25 MG tablet  Commonly known as:  PHENERGAN  Take 25 mg by mouth every 6 (six) hours as needed for nausea or vomiting.     promethazine 25 MG suppository  Commonly known as:  PHENERGAN  Place 25 mg rectally every 6 (six) hours as needed for nausea or vomiting.     sulfamethoxazole-trimethoprim 800-160 MG tablet  Commonly known as:  BACTRIM DS,SEPTRA DS  Take 2 tablets by mouth 2 (two) times daily.         The results of significant diagnostics from this hospitalization (including imaging, microbiology, ancillary and laboratory) are listed below for reference.    Significant Diagnostic Studies: Dg Hand 2 View Right  11/01/2015  CLINICAL DATA:  Puncture wound about the fifth metacarpal. Foreign body. EXAM: RIGHT HAND - 2 VIEW COMPARISON:  None. FINDINGS: No fracture or dislocation. The alignment and joint spaces are maintained. No erosion, periosteal reaction or bony destructive change. No radiopaque foreign body. No soft tissue air. Mild soft tissue prominence about the dorsum of the hand. IMPRESSION: No radiopaque foreign body or acute osseous abnormality. Electronically Signed   By: Jeb Levering M.D.   On: 11/01/2015 02:22   Dg Hand Complete Left  11/01/2015  CLINICAL DATA:  Possible injury to the dorsum of the left hand 1 week ago, with abscess across the metacarpals. Initial encounter. EXAM: LEFT HAND - COMPLETE 3+ VIEW COMPARISON:  None. FINDINGS: There is no evidence of fracture or dislocation. There is no evidence of osseous erosion. The joint spaces are preserved. The carpal rows are intact, and demonstrate normal alignment. Marked dorsal soft tissue swelling is noted. No radiopaque foreign bodies are seen. IMPRESSION: No evidence of fracture or dislocation. No evidence of osseous erosion. Marked dorsal soft tissue swelling noted. No  radiopaque foreign bodies seen. Electronically Signed   By: Garald Balding M.D.   On: 11/01/2015 02:21     Microbiology: No results found for this or any previous visit (from the past 240 hour(s)).   Labs: Basic Metabolic Panel:  Recent Labs Lab 11/01/15 0134 11/01/15 0545  NA 139 137  K 3.7 3.2*  CL 108 106  CO2 17* 20*  GLUCOSE 125* 117*  BUN 28* 23*  CREATININE 1.24 1.04  CALCIUM 9.1 8.7*   Liver Function Tests:  Recent Labs Lab 11/01/15 0545  AST 19  ALT 21  ALKPHOS 77  BILITOT 0.5  PROT 7.3  ALBUMIN 3.2*   No results for input(s): LIPASE, AMYLASE in the last 168 hours. No results for input(s): AMMONIA in the last 168 hours. CBC:  Recent Labs Lab 11/01/15 0134 11/01/15 0545  WBC 16.9* 16.0*  NEUTROABS 13.3*  --   HGB 10.8* 9.9*  HCT 34.0* 30.6*  MCV 87.6 87.7  PLT 470* 425*   Cardiac Enzymes: No results for input(s): CKTOTAL, CKMB, CKMBINDEX, TROPONINI in the last 168 hours. BNP: Invalid input(s): POCBNP CBG: No results for  input(s): GLUCAP in the last 168 hours.  Time coordinating discharge:  Greater than 30 minutes  Signed:  Julius Boniface, DO [facility] Pager: [phone] 11/01/2015, 12:37 PM

## 2015-11-01 NOTE — H&P (Signed)
History and Physical  Patient Name: Travis Callahan     [id number]    DOB: 11-03-1991    DOA: 11/01/2015 Referring physician: Junius Creamer, PA-C PCP: No PCP Per Patient      Chief Complaint: Hand infection  HPI: Travis Callahan is a 24 y.o. male with a past medical history significant for frequent skin infections and exploratory laparotomy for post-appendectomy peritonitis who presents with hand infection.  Note: the patient has charts under different names and social security numbers, same or similar date of birth and addresses.  In addition to the current chart: Travis Callahan [id number] Travis Callahan Travis Callahan Travis Callahan Travis Callahan  Most recently, he was admitted as Travis Callahan [id number] twice to the [facility] service last month for an arm cellulitis.  He reports and chart confirms that he had a boil appear on his right [facility] that became so large he needed operative debridement by Dr. Amedeo Plenty.  The  Op note from that procedure on 11/20 notes "Significant abscess with necrotic tissue, left upper arm and associated median nerve compression and foreign body (needle) in the soft tissues."  This foreign body was sent to pathology and determined to be either a needle or staple.    Since then, the patient has been performing wound care at home, until this week when he was cleaning under the refrigerator (he reports that his roommate got a "brown recluse bite on his leg" and so they were cleaning house) when he afterwards noticed a large sore on the back of his left hand.  This sore became larger and larger and he had chills and nausea, and his friend made him come to the ER.  In the ED, the patient had a golfball-sized abscess on his dorsal left hand.  He had mild acidosis, elevated BUN-creatinine ratio, leukocytosis to 16.9K/uL, and tachycardia.  The abscess was drained and returned ~100cc purulent fluid, per EDP, sent for  culture.  He was administered vancomycin and TRH were asked to admit.     Review of Systems:  Pt complains of hand pain, fever, chills, nausea. All other systems negative except as just noted or noted in the history of present illness.  Allergies  Allergen Reactions  . Penicillins Anaphylaxis  . Morphine And Related Hives and Itching    Prior to Admission medications   Medication Sig Start Date End Date Taking? Authorizing Provider  ondansetron (ZOFRAN-ODT) 4 MG disintegrating tablet Take 4 mg by mouth every 8 (eight) hours as needed for nausea or vomiting.   Yes Historical Provider, MD  oxyCODONE (OXY IR/ROXICODONE) 5 MG immediate release tablet Take 5-10 mg by mouth every 4 (four) hours as needed for severe pain.   Yes Historical Provider, MD  pantoprazole (PROTONIX) 40 MG tablet Take 40 mg by mouth 2 (two) times daily.   Yes Historical Provider, MD  promethazine (PHENERGAN) 25 MG suppository Place 25 mg rectally every 6 (six) hours as needed for nausea or vomiting.   Yes Historical Provider, MD  promethazine (PHENERGAN) 25 MG tablet Take 25 mg by mouth every 6 (six) hours as needed for nausea or vomiting.   Yes Historical Provider, MD    Past Medical History  Diagnosis Date  . Peritonitis ([place]) 2015    After appendectomy, at [facility]    Past Surgical History  Procedure Laterality Date  . Appendectomy    . Abdominal surgery      Family history: family history includes Congestive Heart Failure in his mother;  Heart attack in his father; Hepatitis C in his mother; Rheum arthritis in his mother.  Social History: Patient lives with friends.  He smokes.  He rides dirt bikes often (competitively?).  He using only his prescribed opioids, but see above re: needle in [facility] wound last month, and recent UDS is positive for amphetamines, opioids.  There is no mention that the patient was confronted about the needle fragment, but his amphetamine+ UDS was discussed and he reported at that time  that he uses Adderall from others.     Physical Exam: BP 136/81 mmHg  Pulse 114  Temp(Src) 98.1 F (36.7 C) (Oral)  Resp 20  Ht 5\' 9"  (1.753 m)  Wt 72.576 kg (160 lb)  BMI 23.62 kg/m2  SpO2 100% General appearance: Thin adult male, hyperactive but in no acute distress.   Eyes: Anicteric, conjunctiva Travis, lids and lashes normal.     ENT: No nasal deformity, discharge, or epistaxis.  OP moist without lesions.  Orange film on tongue.   Lymph: No cervical, supraclavicular or axillary lymphadenopathy. Skin: Warm and dry.  There is a very large midline abdominal scar, old.  In the right [facility], there is a roughly 2cm by 10 cm long open shallow wound with red/Travis granulation tissue in the base.  No surrounding erythema.  On the right hand, there is redness and swelling around the now bandaged abcess.  No other rashes.  Few scattered keloid/scars. Cardiac: Tachycardic, regular, nl S1-S2, no murmurs appreciated.   Respiratory: Normal respiratory rate and rhythm.  CTAB. Abdomen: Voluntary guarding.  No pain with movement in bed.  No ascites, distension.   MSK: No deformities or effusions. Neuro: Hyperactive, easily distractible. Sensorium intact and responding to questions.  Speech is fluent.  Moves all extremities equally and with normal coordination.    Psych: Behavior and speech pressured.  No evidence of aural or visual hallucinations or delusions.       Labs on Admission:  The metabolic panel shows normal sodium, potassium. The serum creatinine and BUN ratio is off. The bicarbonate was slightly low. The complete blood count shows leukocytosis.   Radiological Exams on Admission: Personally reviewed: Dg Hand 2 View Right  11/01/2015  CLINICAL DATA:  Puncture wound about the fifth metacarpal. Foreign body. EXAM: RIGHT HAND - 2 VIEW COMPARISON:  None. FINDINGS: No fracture or dislocation. The alignment and joint spaces are maintained. No erosion, periosteal reaction or bony destructive  change. No radiopaque foreign body. No soft tissue air. Mild soft tissue prominence about the dorsum of the hand. IMPRESSION: No radiopaque foreign body or acute osseous abnormality. Electronically Signed   By: Jeb Levering M.D.   On: 11/01/2015 02:22   Dg Hand Complete Left  11/01/2015  CLINICAL DATA:  Possible injury to the dorsum of the left hand 1 week ago, with abscess across the metacarpals. Initial encounter. EXAM: LEFT HAND - COMPLETE 3+ VIEW COMPARISON:  None. FINDINGS: There is no evidence of fracture or dislocation. There is no evidence of osseous erosion. The joint spaces are preserved. The carpal rows are intact, and demonstrate normal alignment. Marked dorsal soft tissue swelling is noted. No radiopaque foreign bodies are seen. IMPRESSION: No evidence of fracture or dislocation. No evidence of osseous erosion. Marked dorsal soft tissue swelling noted. No radiopaque foreign bodies seen. Electronically Signed   By: Garald Balding M.D.   On: 11/01/2015 02:21      Assessment/Plan 1. Hand cellulitis:  This is new.  Has history of MRSA,  reportedly. -Vancomycin  -Follow culture -Oxycodone for pain  -UDS   2. Elevated BUN-creatinine ratio: -Repeat BMP tomorrow        DVT PPx: Lovenox Diet: Regular Consultants: None Code Status: Full Family Communication: Friend at bedside  Medical decision making: What exists of the patient's previous chart across several charts and in [place] was reviewed in depth and the case was discussed with Junius Creamer. Patient seen 4:12 AM on 11/01/2015.  Disposition Plan:  Admit for IV antibiotics and transition to oral agents when improved.  Anticipate hospitalization for 2-3 days.      Edwin Dada [facility] Pager [phone]

## 2015-11-01 NOTE — Progress Notes (Signed)
ANTIBIOTIC CONSULT NOTE - INITIAL  Pharmacy Consult for Vancomycin  Indication: Cellulitis  Allergies  Allergen Reactions  . Penicillins Anaphylaxis  . Morphine And Related Hives and Itching    Patient Measurements: Height: 5\' 6"  (167.6 cm) Weight: 159 lb 13.3 oz (72.5 kg) IBW/kg (Calculated) : 63.8  Vital Signs: Temp: 98.2 F (36.8 C) (12/11 0524) Temp Source: Oral (12/11 0524) BP: 132/72 mmHg (12/11 0524) Pulse Rate: 94 (12/11 0524)  Labs:  Recent Labs  11/01/15 0134  WBC 16.9*  HGB 10.8*  PLT 470*  CREATININE 1.24   Estimated Creatinine Clearance: 82.2 mL/min (by C-G formula based on Cr of 1.24).  Medical History: Past Medical History  Diagnosis Date  . Peritonitis ([place]) 2015    After appendectomy, at [facility]  . Skin infection     Multiple skin infections    Assessment: 24 y/o M with left hand cellulitis, pt had recent admit for abscess in [facility] fossa that required I&D, WBC elevated, renal function ok, other labs reviewed.   Goal of Therapy:  Vancomycin trough level 10-15 mcg/ml  Plan:  -Vancomycin 1000 mg IV q8h -Trend WBC, temp, renal function  -Drug level at steady state  Narda Bonds 11/01/2015,5:29 AM

## 2015-11-01 NOTE — Progress Notes (Signed)
Patient to leave AMA. Dr Tat notified. Antibiotics called into pharmacy.

## 2015-11-02 ENCOUNTER — Encounter (HOSPITAL_COMMUNITY): Payer: Self-pay | Admitting: *Deleted

## 2015-11-03 ENCOUNTER — Emergency Department (HOSPITAL_COMMUNITY): Payer: Self-pay

## 2015-11-03 ENCOUNTER — Emergency Department (HOSPITAL_COMMUNITY): Admission: EM | Admit: 2015-11-03 | Payer: Self-pay | Source: Home / Self Care

## 2015-11-03 ENCOUNTER — Inpatient Hospital Stay (HOSPITAL_COMMUNITY)
Admission: EM | Admit: 2015-11-03 | Discharge: 2015-11-07 | DRG: 571 | Disposition: A | Discharge status: Home / Self Care | Payer: Self-pay | Attending: Orthopedic Surgery | Admitting: Orthopedic Surgery

## 2015-11-03 ENCOUNTER — Encounter (HOSPITAL_COMMUNITY)
Admission: EM | Disposition: A | Discharge status: Home / Self Care | Payer: Self-pay | Source: Home / Self Care | Attending: Orthopedic Surgery

## 2015-11-03 ENCOUNTER — Emergency Department (HOSPITAL_COMMUNITY): Payer: Self-pay | Admitting: Certified Registered Nurse Anesthetist

## 2015-11-03 ENCOUNTER — Encounter (HOSPITAL_COMMUNITY): Payer: Self-pay | Admitting: Emergency Medicine

## 2015-11-03 DIAGNOSIS — M659 Synovitis and tenosynovitis, unspecified: Secondary | ICD-10-CM | POA: Diagnosis present

## 2015-11-03 DIAGNOSIS — L02413 Cutaneous abscess of right upper limb: Principal | ICD-10-CM | POA: Diagnosis present

## 2015-11-03 DIAGNOSIS — Z8711 Personal history of peptic ulcer disease: Secondary | ICD-10-CM

## 2015-11-03 DIAGNOSIS — K5090 Crohn's disease, unspecified, without complications: Secondary | ICD-10-CM | POA: Diagnosis present

## 2015-11-03 DIAGNOSIS — F1910 Other psychoactive substance abuse, uncomplicated: Secondary | ICD-10-CM | POA: Diagnosis present

## 2015-11-03 DIAGNOSIS — Z91041 Radiographic dye allergy status: Secondary | ICD-10-CM

## 2015-11-03 DIAGNOSIS — F17200 Nicotine dependence, unspecified, uncomplicated: Secondary | ICD-10-CM | POA: Diagnosis present

## 2015-11-03 DIAGNOSIS — L02512 Cutaneous abscess of left hand: Secondary | ICD-10-CM | POA: Diagnosis present

## 2015-11-03 DIAGNOSIS — Z880 Allergy status to penicillin: Secondary | ICD-10-CM

## 2015-11-03 DIAGNOSIS — Z886 Allergy status to analgesic agent status: Secondary | ICD-10-CM

## 2015-11-03 DIAGNOSIS — Z885 Allergy status to narcotic agent status: Secondary | ICD-10-CM

## 2015-11-03 DIAGNOSIS — Z888 Allergy status to other drugs, medicaments and biological substances status: Secondary | ICD-10-CM

## 2015-11-03 DIAGNOSIS — F1290 Cannabis use, unspecified, uncomplicated: Secondary | ICD-10-CM | POA: Diagnosis present

## 2015-11-03 HISTORY — PX: I & D EXTREMITY: SHX5045

## 2015-11-03 HISTORY — PX: IRRIGATION AND DEBRIDEMENT ABSCESS: SHX5252

## 2015-11-03 LAB — CBC WITH DIFFERENTIAL/PLATELET
BASOS ABS: 0.1 10*3/uL (ref 0.0–0.1)
Basophils Relative: 1 %
EOS PCT: 3 %
Eosinophils Absolute: 0.2 10*3/uL (ref 0.0–0.7)
HCT: 33.8 % — ABNORMAL LOW (ref 39.0–52.0)
Hemoglobin: 11 g/dL — ABNORMAL LOW (ref 13.0–17.0)
LYMPHS PCT: 27 %
Lymphs Abs: 2.4 10*3/uL (ref 0.7–4.0)
MCH: 28.6 pg (ref 26.0–34.0)
MCHC: 32.5 g/dL (ref 30.0–36.0)
MCV: 87.8 fL (ref 78.0–100.0)
MONO ABS: 0.7 10*3/uL (ref 0.1–1.0)
Monocytes Relative: 8 %
Neutro Abs: 5.5 10*3/uL (ref 1.7–7.7)
Neutrophils Relative %: 61 %
Platelets: 512 10*3/uL — ABNORMAL HIGH (ref 150–400)
RBC: 3.85 MIL/uL — ABNORMAL LOW (ref 4.22–5.81)
RDW: 14.3 % (ref 11.5–15.5)
WBC: 8.8 10*3/uL (ref 4.0–10.5)

## 2015-11-03 LAB — BASIC METABOLIC PANEL
Anion gap: 9 (ref 5–15)
BUN: 11 mg/dL (ref 6–20)
CHLORIDE: 105 mmol/L (ref 101–111)
CO2: 23 mmol/L (ref 22–32)
CREATININE: 0.77 mg/dL (ref 0.61–1.24)
Calcium: 8.9 mg/dL (ref 8.9–10.3)
GFR calc Af Amer: >60 mL/min (ref >60)
GFR calc non Af Amer: >60 mL/min (ref >60)
Glucose, Bld: 118 mg/dL — ABNORMAL HIGH (ref 65–99)
Potassium: 4.4 mmol/L (ref 3.5–5.1)
Sodium: 137 mmol/L (ref 135–145)

## 2015-11-03 LAB — I-STAT CG4 LACTIC ACID, ED: Lactic Acid, Venous: 2.34 mmol/L (ref 0.5–2.0)

## 2015-11-03 SURGERY — IRRIGATION AND DEBRIDEMENT EXTREMITY
Anesthesia: General | Site: Hand | Laterality: Right

## 2015-11-03 MED ORDER — PROPOFOL 10 MG/ML IV BOLUS
INTRAVENOUS | Status: AC
  Filled 2015-11-03: qty 20

## 2015-11-03 MED ORDER — DOCUSATE SODIUM 100 MG PO CAPS
100.0000 mg | ORAL_CAPSULE | Freq: Two times a day (BID) | ORAL | Status: DC
  Administered 2015-11-03 – 2015-11-07 (×8): 100 mg via ORAL
  Filled 2015-11-03 (×8): qty 1

## 2015-11-03 MED ORDER — ONDANSETRON HCL 4 MG/2ML IJ SOLN: 4.0000 mg | Freq: Once | INTRAMUSCULAR | Status: DC | PRN

## 2015-11-03 MED ORDER — ONDANSETRON HCL 4 MG/2ML IJ SOLN
INTRAMUSCULAR | Status: AC
  Filled 2015-11-03: qty 2

## 2015-11-03 MED ORDER — HYDROMORPHONE HCL 1 MG/ML IJ SOLN
INTRAMUSCULAR | Status: AC
  Administered 2015-11-03: 0.5 mg via INTRAVENOUS
  Filled 2015-11-03: qty 1

## 2015-11-03 MED ORDER — BISACODYL 10 MG RE SUPP: 10.0000 mg | Freq: Every day | RECTAL | Status: DC | PRN

## 2015-11-03 MED ORDER — ONDANSETRON HCL 4 MG/2ML IJ SOLN
4.0000 mg | Freq: Once | INTRAMUSCULAR | Status: AC
Start: 2015-11-03 — End: 2015-11-03
  Administered 2015-11-03: 4 mg via INTRAVENOUS
  Filled 2015-11-03: qty 2

## 2015-11-03 MED ORDER — VANCOMYCIN HCL 1000 MG IV SOLR
1000.0000 mg | INTRAVENOUS | Status: DC | PRN
  Administered 2015-11-03: 1000 mg via INTRAVENOUS

## 2015-11-03 MED ORDER — MEPERIDINE HCL 25 MG/ML IJ SOLN: 6.2500 mg | INTRAMUSCULAR | Status: DC | PRN

## 2015-11-03 MED ORDER — POTASSIUM CHLORIDE IN NACL 20-0.45 MEQ/L-% IV SOLN
INTRAVENOUS | Status: DC
Start: 2015-11-03 — End: 2015-11-07
  Administered 2015-11-04 – 2015-11-05 (×3): via INTRAVENOUS
  Filled 2015-11-03 (×9): qty 1000

## 2015-11-03 MED ORDER — DIPHENHYDRAMINE HCL 25 MG PO CAPS
25.0000 mg | ORAL_CAPSULE | Freq: Four times a day (QID) | ORAL | Status: DC | PRN
  Administered 2015-11-07: 50 mg via ORAL
  Filled 2015-11-03: qty 2

## 2015-11-03 MED ORDER — MIDAZOLAM HCL 2 MG/2ML IJ SOLN
INTRAMUSCULAR | Status: AC
  Filled 2015-11-03: qty 2

## 2015-11-03 MED ORDER — ONDANSETRON HCL 4 MG/2ML IJ SOLN: 4.0000 mg | Freq: Four times a day (QID) | INTRAMUSCULAR | Status: DC | PRN

## 2015-11-03 MED ORDER — OXYCODONE HCL 5 MG PO TABS
ORAL_TABLET | ORAL | Status: AC
  Administered 2015-11-03: 10 mg via ORAL
  Filled 2015-11-03: qty 2

## 2015-11-03 MED ORDER — LIDOCAINE HCL (CARDIAC) 20 MG/ML IV SOLN
INTRAVENOUS | Status: DC | PRN
  Administered 2015-11-03: 100 mg via INTRAVENOUS

## 2015-11-03 MED ORDER — MIDAZOLAM HCL 5 MG/5ML IJ SOLN
INTRAMUSCULAR | Status: DC | PRN
  Administered 2015-11-03: 2 mg via INTRAVENOUS

## 2015-11-03 MED ORDER — HYDROMORPHONE HCL 1 MG/ML IJ SOLN
0.2500 mg | INTRAMUSCULAR | Status: DC | PRN
  Administered 2015-11-03 (×3): 0.5 mg via INTRAVENOUS

## 2015-11-03 MED ORDER — MAGNESIUM CITRATE PO SOLN: 1.0000 | Freq: Once | ORAL | Status: DC | PRN

## 2015-11-03 MED ORDER — METHOCARBAMOL 500 MG PO TABS
500.0000 mg | ORAL_TABLET | Freq: Four times a day (QID) | ORAL | Status: DC | PRN
  Administered 2015-11-04 – 2015-11-07 (×6): 500 mg via ORAL
  Filled 2015-11-03 (×7): qty 1

## 2015-11-03 MED ORDER — POLYETHYLENE GLYCOL 3350 17 G PO PACK
17.0000 g | PACK | Freq: Every day | ORAL | Status: DC | PRN
  Administered 2015-11-07: 17 g via ORAL
  Filled 2015-11-03: qty 1

## 2015-11-03 MED ORDER — GENTAMICIN SULFATE 40 MG/ML IJ SOLN
500.0000 mg | INTRAVENOUS | Status: DC
  Administered 2015-11-03 – 2015-11-06 (×4): 500 mg via INTRAVENOUS
  Filled 2015-11-03 (×5): qty 12.5

## 2015-11-03 MED ORDER — VANCOMYCIN HCL IN DEXTROSE 1-5 GM/200ML-% IV SOLN
INTRAVENOUS | Status: AC
  Filled 2015-11-03: qty 200

## 2015-11-03 MED ORDER — PROPOFOL 10 MG/ML IV BOLUS
INTRAVENOUS | Status: DC | PRN
  Administered 2015-11-03: 200 mg via INTRAVENOUS

## 2015-11-03 MED ORDER — VANCOMYCIN HCL 10 G IV SOLR
1500.0000 mg | Freq: Once | INTRAVENOUS | Status: AC
  Administered 2015-11-03: 1500 mg via INTRAVENOUS
  Filled 2015-11-03: qty 1500

## 2015-11-03 MED ORDER — 0.9 % SODIUM CHLORIDE (POUR BTL) OPTIME
TOPICAL | Status: DC | PRN
  Administered 2015-11-03: 1000 mL

## 2015-11-03 MED ORDER — VITAMIN C 500 MG PO TABS
1000.0000 mg | ORAL_TABLET | Freq: Every day | ORAL | Status: DC
  Administered 2015-11-03 – 2015-11-07 (×5): 1000 mg via ORAL
  Filled 2015-11-03 (×6): qty 2

## 2015-11-03 MED ORDER — HYDROMORPHONE HCL 1 MG/ML IJ SOLN
1.0000 mg | INTRAMUSCULAR | Status: DC | PRN
  Administered 2015-11-03 – 2015-11-07 (×25): 1 mg via INTRAVENOUS
  Filled 2015-11-03 (×27): qty 1

## 2015-11-03 MED ORDER — OXYCODONE HCL 5 MG PO TABS
5.0000 mg | ORAL_TABLET | ORAL | Status: DC | PRN
  Administered 2015-11-03 – 2015-11-04 (×6): 10 mg via ORAL
  Administered 2015-11-04: 5 mg via ORAL
  Administered 2015-11-05 – 2015-11-07 (×15): 10 mg via ORAL
  Filled 2015-11-03 (×23): qty 2

## 2015-11-03 MED ORDER — FENTANYL CITRATE (PF) 100 MCG/2ML IJ SOLN
INTRAMUSCULAR | Status: DC | PRN
  Administered 2015-11-03: 50 ug via INTRAVENOUS
  Administered 2015-11-03 (×2): 100 ug via INTRAVENOUS

## 2015-11-03 MED ORDER — PROMETHAZINE HCL 25 MG RE SUPP: 12.5000 mg | Freq: Four times a day (QID) | RECTAL | Status: DC | PRN

## 2015-11-03 MED ORDER — METHOCARBAMOL 1000 MG/10ML IJ SOLN
500.0000 mg | Freq: Four times a day (QID) | INTRAVENOUS | Status: DC | PRN
  Filled 2015-11-03: qty 5

## 2015-11-03 MED ORDER — VANCOMYCIN HCL IN DEXTROSE 1-5 GM/200ML-% IV SOLN
1000.0000 mg | Freq: Three times a day (TID) | INTRAVENOUS | Status: DC
  Administered 2015-11-04 – 2015-11-07 (×10): 1000 mg via INTRAVENOUS
  Filled 2015-11-03 (×11): qty 200

## 2015-11-03 MED ORDER — ONDANSETRON HCL 4 MG/2ML IJ SOLN
INTRAMUSCULAR | Status: DC | PRN
  Administered 2015-11-03: 4 mg via INTRAVENOUS

## 2015-11-03 MED ORDER — ONDANSETRON HCL 4 MG PO TABS: 4.0000 mg | ORAL_TABLET | Freq: Four times a day (QID) | ORAL | Status: DC | PRN

## 2015-11-03 MED ORDER — FAMOTIDINE 20 MG PO TABS: 20.0000 mg | ORAL_TABLET | Freq: Two times a day (BID) | ORAL | Status: DC | PRN

## 2015-11-03 MED ORDER — FENTANYL CITRATE (PF) 250 MCG/5ML IJ SOLN
INTRAMUSCULAR | Status: AC
  Filled 2015-11-03: qty 5

## 2015-11-03 MED ORDER — LACTATED RINGERS IV SOLN
INTRAVENOUS | Status: DC | PRN
  Administered 2015-11-03: 19:00:00 via INTRAVENOUS

## 2015-11-03 MED ORDER — BUPIVACAINE HCL (PF) 0.25 % IJ SOLN
INTRAMUSCULAR | Status: AC
  Filled 2015-11-03: qty 30

## 2015-11-03 MED ORDER — ALPRAZOLAM 0.5 MG PO TABS
0.5000 mg | ORAL_TABLET | Freq: Four times a day (QID) | ORAL | Status: DC | PRN
  Administered 2015-11-05 – 2015-11-06 (×3): 0.5 mg via ORAL
  Filled 2015-11-03 (×3): qty 1

## 2015-11-03 MED ORDER — SODIUM CHLORIDE 0.9 % IR SOLN
Status: DC | PRN
  Administered 2015-11-03: 3000 mL

## 2015-11-03 SURGICAL SUPPLY — 49 items
BANDAGE ELASTIC 4 VELCRO ST LF (GAUZE/BANDAGES/DRESSINGS) ×3 IMPLANT
BNDG CONFORM 2 STRL LF (GAUZE/BANDAGES/DRESSINGS) IMPLANT
BNDG GAUZE ELAST 4 BULKY (GAUZE/BANDAGES/DRESSINGS) ×6 IMPLANT
CORDS BIPOLAR (ELECTRODE) ×3 IMPLANT
CUFF TOURNIQUET SINGLE 18IN (TOURNIQUET CUFF) ×3 IMPLANT
CUFF TOURNIQUET SINGLE 24IN (TOURNIQUET CUFF) IMPLANT
DRAPE EXTREMITY T 121X128X90 (DRAPE) ×3 IMPLANT
DRSG ADAPTIC 3X8 NADH LF (GAUZE/BANDAGES/DRESSINGS) ×3 IMPLANT
GAUZE SPONGE 4X4 12PLY STRL (GAUZE/BANDAGES/DRESSINGS) ×3 IMPLANT
GAUZE XEROFORM 1X8 LF (GAUZE/BANDAGES/DRESSINGS) ×6 IMPLANT
GLOVE BIOGEL M STRL SZ7.5 (GLOVE) ×3 IMPLANT
GLOVE BIOGEL PI IND STRL 6.5 (GLOVE) ×2 IMPLANT
GLOVE BIOGEL PI INDICATOR 6.5 (GLOVE) ×1
GLOVE ECLIPSE 6.5 STRL STRAW (GLOVE) ×3 IMPLANT
GLOVE SS BIOGEL STRL SZ 8 (GLOVE) ×2 IMPLANT
GLOVE SUPERSENSE BIOGEL SZ 8 (GLOVE) ×1
GOWN STRL REUS W/ TWL LRG LVL3 (GOWN DISPOSABLE) ×2 IMPLANT
GOWN STRL REUS W/ TWL XL LVL3 (GOWN DISPOSABLE) ×4 IMPLANT
GOWN STRL REUS W/TWL LRG LVL3 (GOWN DISPOSABLE) ×1
GOWN STRL REUS W/TWL XL LVL3 (GOWN DISPOSABLE) ×2
HANDPIECE INTERPULSE COAX TIP (DISPOSABLE)
KIT BASIN OR (CUSTOM PROCEDURE TRAY) ×3 IMPLANT
KIT ROOM TURNOVER OR (KITS) ×3 IMPLANT
LOOP VESSEL MAXI BLUE (MISCELLANEOUS) ×3 IMPLANT
MANIFOLD NEPTUNE II (INSTRUMENTS) ×3 IMPLANT
NEEDLE HYPO 25GX1X1/2 BEV (NEEDLE) IMPLANT
NS IRRIG 1000ML POUR BTL (IV SOLUTION) ×3 IMPLANT
PACK ORTHO EXTREMITY (CUSTOM PROCEDURE TRAY) ×3 IMPLANT
PAD ARMBOARD 7.5X6 YLW CONV (MISCELLANEOUS) ×3 IMPLANT
PAD CAST 4YDX4 CTTN HI CHSV (CAST SUPPLIES) ×2 IMPLANT
PADDING CAST ABS 4INX4YD NS (CAST SUPPLIES) ×1
PADDING CAST ABS COTTON 4X4 ST (CAST SUPPLIES) ×2 IMPLANT
PADDING CAST COTTON 4X4 STRL (CAST SUPPLIES) ×1
SET CYSTO W/LG BORE CLAMP LF (SET/KITS/TRAYS/PACK) ×3 IMPLANT
SET HNDPC FAN SPRY TIP SCT (DISPOSABLE) IMPLANT
SPLINT FIBERGLASS 3X12 (CAST SUPPLIES) ×3 IMPLANT
SPONGE GAUZE 4X4 12PLY STER LF (GAUZE/BANDAGES/DRESSINGS) ×6 IMPLANT
SPONGE LAP 18X18 X RAY DECT (DISPOSABLE) ×3 IMPLANT
SPONGE LAP 4X18 X RAY DECT (DISPOSABLE) ×3 IMPLANT
SUT PROLENE 4 0 PS 2 18 (SUTURE) ×6 IMPLANT
SUT VIC AB 3-0 SH 27 (SUTURE) ×1
SUT VIC AB 3-0 SH 27X BRD (SUTURE) ×2 IMPLANT
SYR CONTROL 10ML LL (SYRINGE) IMPLANT
TOWEL OR 17X24 6PK STRL BLUE (TOWEL DISPOSABLE) ×3 IMPLANT
TOWEL OR 17X26 10 PK STRL BLUE (TOWEL DISPOSABLE) ×3 IMPLANT
TUBE ANAEROBIC SPECIMEN COL (MISCELLANEOUS) IMPLANT
TUBE CONNECTING 12X1/4 (SUCTIONS) ×3 IMPLANT
WATER STERILE IRR 1000ML POUR (IV SOLUTION) ×3 IMPLANT
YANKAUER SUCT BULB TIP NO VENT (SUCTIONS) ×3 IMPLANT

## 2015-11-03 NOTE — Progress Notes (Signed)
Entered in EPIC   Please use the resources provided to you in emergency room by case manager to assist with doctor for follow up These [place] uninsured resources provide possible primary care providers, resources for discounted medications, housing, dental resources, affordable care act information, plus other resources for [facility]

## 2015-11-03 NOTE — Anesthesia Postprocedure Evaluation (Signed)
Anesthesia Post Note  Patient: Travis Callahan  Procedure(s) Performed: Procedure(s) (LRB): IRRIGATION AND DEBRIDEMENT RIGHT ELBOW  (Right) IRRIGATION AND DEBRIDEMENT LEFT HAND ABSCESS (Left)  Patient location during evaluation: PACU Anesthesia Type: General Level of consciousness: awake Pain management: pain level controlled Vital Signs Assessment: post-procedure vital signs reviewed and stable Respiratory status: spontaneous breathing Cardiovascular status: blood pressure returned to baseline Anesthetic complications: no    Last Vitals:  Filed Vitals:   11/03/15 1522 11/03/15 2030  BP: 161/79 163/96  Pulse: 97 86  Temp:  36.6 C  Resp: 22 16    Last Pain:  Filed Vitals:   11/03/15 2031  PainSc: 10-Worst pain ever                 EDWARDS,Ivie Maese

## 2015-11-03 NOTE — Progress Notes (Signed)
Report given to [facility], CRNA

## 2015-11-03 NOTE — Anesthesia Preprocedure Evaluation (Signed)
Anesthesia Evaluation  Patient identified by MRN, date of birth, ID band Patient awake    Reviewed: Allergy & Precautions, NPO status , Patient's Chart, lab work & pertinent test results  Airway Mallampati: I  TM Distance: >3 FB Neck ROM: Full    Dental   Pulmonary Current Smoker,    Pulmonary exam normal        Cardiovascular Normal cardiovascular exam     Neuro/Psych    GI/Hepatic PUD,   Endo/Other    Renal/GU      Musculoskeletal   Abdominal   Peds  Hematology   Anesthesia Other Findings   Reproductive/Obstetrics                             Anesthesia Physical Anesthesia Plan  ASA: II  Anesthesia Plan: General   Post-op Pain Management:    Induction: Intravenous  Airway Management Planned: LMA  Additional Equipment:   Intra-op Plan:   Post-operative Plan: Extubation in OR  Informed Consent: I have reviewed the patients History and Physical, chart, labs and discussed the procedure including the risks, benefits and alternatives for the proposed anesthesia with the patient or authorized representative who has indicated his/her understanding and acceptance.     Plan Discussed with: CRNA and Surgeon  Anesthesia Plan Comments:         Anesthesia Quick Evaluation

## 2015-11-03 NOTE — Transfer of Care (Signed)
Immediate Anesthesia Transfer of Care Note  Patient: Travis Callahan  Procedure(s) Performed: Procedure(s): IRRIGATION AND DEBRIDEMENT RIGHT ELBOW  (Right) IRRIGATION AND DEBRIDEMENT LEFT HAND ABSCESS (Left)  Patient Location: PACU  Anesthesia Type:General  Level of Consciousness: awake, oriented, patient cooperative and responds to stimulation  Airway & Oxygen Therapy: Patient Spontanous Breathing and Patient connected to nasal cannula oxygen  Post-op Assessment: Report given to RN, Post -op Vital signs reviewed and stable and Patient moving all extremities X 4  Post vital signs: Reviewed and stable  Last Vitals:  Filed Vitals:   11/03/15 1522 11/03/15 2030  BP: 161/79 163/96  Pulse: 97 86  Temp:  36.6 C  Resp: 22 16    Complications: No apparent anesthesia complications

## 2015-11-03 NOTE — Op Note (Signed)
See dictation#  Shanedra Lave M.D. 

## 2015-11-03 NOTE — Progress Notes (Signed)
CM spoke with pt who confirms uninsured [employer] resident with no pcp.  CM discussed and provided written information for uninsured accepting pcps, discussed the importance of pcp vs EDP services for f/u care, www.needymeds.org, www.goodrx.com, discounted pharmacies and other [employer] such as [employer], affordable care act, financial assistance, uninsured dental services, [place] med assist, DSS and  health department  Reviewed resources for [employer] uninsured accepting pcps like Jinny Blossom, family medicine at [employer], [facility] [place] [facility], [facility], local urgent care centers, [facility], [facility] family practice, general medical clinics, family services of the [place], [facility] urgent care plus others, medication resources, [facility] out patient pharmacies and housing Pt voiced understanding and appreciation of resources provided   Provided [facility] contact information Pt did agreed to a referral Cm completed referral States he was told he did not qualify by a lady at the health dept

## 2015-11-03 NOTE — Progress Notes (Signed)
ANTIBIOTIC CONSULT NOTE - INITIAL  Pharmacy Consult for Gentamycin and Vancomycin Indication: L hand abscess   Patient Measurements: Height: 5\' 9"  (175.3 cm) Weight: 160 lb (72.576 kg) IBW/kg (Calculated) : 70.7  Dosing weight will 72.5 kg  Vital Signs: Temp: 98 F (36.7 C) (12/13 2147) Temp Source: Oral (12/13 2147) BP: 180/90 mmHg (12/13 2147) Pulse Rate: 78 (12/13 2147)   Intake/Output from this shift: Total I/O In: 900 [I.V.:900] Out: -   Labs:  Recent Labs  11/03/15 1338  WBC 8.8  HGB 11.0*  PLT 512*  CREATININE 0.77   CrCl: > 100 ml/min  Microbiology: Anti-infective's  12/13 Gentamycin >> 12/13 Vancomycin >>   Cx data: 12/13 abscess (op cx): px  Assessment: 81 yoM presenting to ER on 12/13 with L hand pain, swelling and concern for abscess in setting of IVDU. He was evaluated by orthopedics and is now s/p I&D of L hand abscess and R elbow which had an abscess that was drained 3 weeks prior. Upon admission WBC elevated at 8.8, Lactic acid 2.34, tmax reported as 102.5 and SCr 0.77. Pt started on Gentamycin and Vancomycin after sx.  Goal of Therapy:  Vancomycin trough level 15-20 mcg/ml  Gentamycin level of < 0.5 if drawn as a trough  Plan:  Vancomycin: 1. 1500 mg Loading dose x 1 now; followed by 1000 mg every 8 hours starting on 12/14 2. If continued will obtain level at steady state  Gentamycin: 1. Start on 500 mg IV Q24H as appears to a strong candidate for extended interval dosing 2. Obtain gentamycin random level in ~ 10 hours to confirm that he should receive q 24H dosing interval 3. Await pending micro data and narrow abx as feasible   Vincenza Hews, PharmD, BCPS 11/03/2015, 10:17 PM Pager: [phone]

## 2015-11-03 NOTE — H&P (Signed)
Travis Callahan is an 24 y.o. male.   Chief Complaint: Abscess left hand HPI: Patient presents with an abscess in his left hand. 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  He denied IV drug abuse at that time but after a long discussion about my concerns and the challenges of drug use today he admitted that he has injected and he has injected methamphetamine in the past to the areas  He is certainly young man is struggling  The chart reflects the signed out AGAINST MEDICAL ADVICE Sunday.  He denies neck back chest or abdominal pain.    Past Medical History  Diagnosis Date  . Stomach ulcer   . Crohn's   . Bowel obstruction (HCC)   . Ulcer     Past Surgical History  Procedure Laterality Date  . Stomach surgery    . Appendectomy    . Abdominal surgery      Family History  Problem Relation Age of Onset  . Congestive Heart Failure Mother    Social History:  reports that he has been smoking.  He does not have any smokeless tobacco history on file. He reports that he uses illicit drugs (Marijuana). He reports that he does not drink alcohol.  Allergies:  Allergies  Allergen Reactions  . Nsaids Anaphylaxis and Other (See Comments)    Ulcers   . Penicillins Anaphylaxis    Has patient had a PCN reaction causing immediate rash, facial/tongue/throat swelling, SOB or lightheadedness with hypotension: Yes Has patient had a PCN reaction causing severe rash involving mucus membranes or skin necrosis: No Has patient had a PCN reaction that required hospitalization No Has patient had a PCN reaction occurring within the last 10 years: No If all of the above answers are "NO", then may proceed with Cephalosporin use.   . Ivp Dye [Iodinated Diagnostic  Agents] Other (See Comments)    Needs solumedrol and benadryl prior  . Ketorolac Tromethamine Hives, Itching and Other (See Comments)    STOMACH ULCER  . Tramadol Hives, Itching and Other (See Comments)    Ulcer   . Morphine And Related Hives and Itching    Medications Prior to Admission  Medication Sig Dispense Refill  . acetaminophen (TYLENOL) 500 MG tablet Take 1,000 mg by mouth every 6 (six) hours as needed for moderate pain or headache.    . oxyCODONE (OXY IR/ROXICODONE) 5 MG immediate release tablet Take 10 mg by mouth every 6 (six) hours as needed for severe pain.      Results for orders placed or performed during the hospital encounter of 11/03/15 (from the past 48 hour(s))  CBC with Differential     Status: Abnormal   Collection Time: 11/03/15  1:38 PM  Result Value Ref Range   WBC 8.8 4.0 - 10.5 K/uL   RBC 3.85 (L) 4.22 - 5.81 MIL/uL   Hemoglobin 11.0 (L) 13.0 - 17.0 g/dL   HCT 33.8 (L) 39.0 - 52.0 %   MCV 87.8 78.0 - 100.0 fL   MCH 28.6 26.0 - 34.0 pg   MCHC 32.5 30.0 - 36.0 g/dL   RDW 14.3 11.5 - 15.5 %   Platelets 512 (H) 150 - 400 K/uL   Neutrophils Relative % 61 %     Neutro Abs 5.5 1.7 - 7.7 K/uL   Lymphocytes Relative 27 %   Lymphs Abs 2.4 0.7 - 4.0 K/uL   Monocytes Relative 8 %   Monocytes Absolute 0.7 0.1 - 1.0 K/uL   Eosinophils Relative 3 %   Eosinophils Absolute 0.2 0.0 - 0.7 K/uL   Basophils Relative 1 %   Basophils Absolute 0.1 0.0 - 0.1 K/uL  Basic metabolic panel     Status: Abnormal   Collection Time: 11/03/15  1:38 PM  Result Value Ref Range   Sodium 137 135 - 145 mmol/L   Potassium 4.4 3.5 - 5.1 mmol/L   Chloride 105 101 - 111 mmol/L   CO2 23 22 - 32 mmol/L   Glucose, Bld 118 (H) 65 - 99 mg/dL   BUN 11 6 - 20 mg/dL   Creatinine, Ser 0.77 0.61 - 1.24 mg/dL   Calcium 8.9 8.9 - 10.3 mg/dL   GFR calc non Af Amer >60 >60 mL/min   GFR calc Af Amer >60 >60 mL/min    Comment: (NOTE) The eGFR has been calculated using the CKD EPI equation. This  calculation has not been validated in all clinical situations. eGFR's persistently <60 mL/min signify possible Chronic Kidney Disease.    Anion gap 9 5 - 15  I-Stat CG4 Lactic Acid, ED     Status: Abnormal   Collection Time: 11/03/15  1:47 PM  Result Value Ref Range   Lactic Acid, Venous 2.34 (HH) 0.5 - 2.0 mmol/L   Comment NOTIFIED PHYSICIAN    Dg Hand Complete Left  11/03/2015  CLINICAL DATA:  Left hand pain and swelling with an open abscess. Fever. Soft tissue wound along dorsal aspect the metacarpals. Initial encounter. EXAM: LEFT HAND - COMPLETE 3+ VIEW COMPARISON:  None. FINDINGS: Soft tissues of the hand are markedly swollen. An open wound is seen on the dorsal aspect of the hand at the level of mid metacarpals. No underlying radiopaque foreign body or soft tissue gas collection is identified. There is no fracture or dislocation. IMPRESSION: Intense soft tissue swelling with an open wound on the dorsal aspect of the left hand. The examination is otherwise negative. Electronically Signed   By: Thomas  Dalessio M.D.   On: 11/03/2015 13:58    Review of Systems  Respiratory: Negative.   Cardiovascular: Negative.   Gastrointestinal: Negative.   Genitourinary: Negative.     Blood pressure 161/79, pulse 97, temperature 98.2 F (36.8 C), temperature source Oral, resp. rate 22, SpO2 100 %. Physical Exam left hand abscess with a draining wound dorsally. He has what appears to be swelling erythema and lymphangitis. His difficult for him to move the hand. He has intact refill and is sensate.  Right elbow has an open wound which is markedly improved from prior visits but certainly still needs to be dressed and irrigated daily. Fortunately the right upper extremity is neurovascularly intact.  Lower extremity examination is benign. Chest is clear HEENT is within normal limits.  No evidence of compartment syndrome in the extremities  The patient is alert and oriented in no acute  distress. The patient complains of pain in the affected upper extremity.  The patient is noted to have a normal HEENT exam. Lung fields show equal chest expansion and no shortness of breath. Abdomen exam is nontender without distention. Lower extremity examination does not show any fracture dislocation or blood clot symptoms. Pelvis is stable and the neck and back are stable and nontender.  Assessment/Plan Left hand abscess-we will   plan for irrigation and debridement number per structures as necessary. I discussed him relevant issues do's and don'ts. I last for behavioral health to see him and evaluate him. He seems genuinely remorseful. He seems want to get his life back on track.  I'll also perform irrigation and debridement of the right elbow antecubital fossa region. This is much improved but still has some healing to undergo. I feel that he is likely disregarded this area at times given the recent issues.  I discussed him all issues do's and don'ts. We'll perform surgical intervention   III, M 11/03/2015, 6:49 PM    

## 2015-11-03 NOTE — Progress Notes (Signed)
While patient was getting undressed I heard a drink bottle open. I asked patient if he was drinking something patient advised that he had taken a sip of clear water that he brought with him in a bag. Informed patient not to drink any additional fluids.

## 2015-11-03 NOTE — ED Notes (Addendum)
Unable to get blood from the patient. Patient stated that he would not like a second stick, states "Travis Callahan can draw blood when I get there".

## 2015-11-03 NOTE — Progress Notes (Signed)
Valuables taken to security, 1 bag that contained clothing plastic bracelet, and hat taken to recovery room.

## 2015-11-03 NOTE — ED Provider Notes (Signed)
CSN: [id number]     Arrival date & time 11/03/15  1236 History   First MD Initiated Contact with Patient 11/03/15 1307     Chief Complaint  Patient presents with  . Abscess  . Emesis  . Abdominal Pain     (Consider location/radiation/quality/duration/timing/severity/associated sxs/prior Treatment) HPI Travis Callahan is a 24 y.o. male with history of stomach ulcers, Crohn's disease, MRSA infection, presents to emergency department complaining of left hand pain and swelling. Patient was admitted to the hospital 3 weeks ago with right antecubital abscess and had to have surgical debridement at that time. Patient has had persistent nausea and vomiting since then, however had to sign out of the hospital against medical advise due to home circumstances. Patient states that the arm healed well, however 2 days ago states he developed new swelling and pain to the left dorsal hand. He states that this morning though abscess opened and started to drain. He reports taken Tylenol this morning around 7:30, however has had no relief of pain. He states he did have a temperature of 102.5 this morning at home. He reports persistent nausea and vomiting, generalized malaise. He states he can't keep any fluids or food down. He denies any IV drug use.   Past Medical History  Diagnosis Date  . Stomach ulcer   . Crohn's   . Bowel obstruction ([place])   . Ulcer    Past Surgical History  Procedure Laterality Date  . Stomach surgery    . Appendectomy    . Abdominal surgery     Family History  Problem Relation Age of Onset  . Congestive Heart Failure Mother    Social History  Substance Use Topics  . Smoking status: Current Every Day Smoker  . Smokeless tobacco: None  . Alcohol Use: No    Review of Systems  Constitutional: Positive for fatigue. Negative for fever and chills.  Respiratory: Negative for cough, chest tightness and shortness of breath.   Cardiovascular: Negative for chest pain,  palpitations and leg swelling.  Gastrointestinal: Positive for nausea, vomiting and abdominal pain. Negative for diarrhea.  Genitourinary: Negative for dysuria, urgency, frequency and hematuria.  Musculoskeletal: Positive for myalgias and arthralgias. Negative for neck pain and neck stiffness.  Skin: Positive for wound.  Allergic/Immunologic: Negative for immunocompromised state.  Neurological: Positive for weakness. Negative for dizziness, light-headedness, numbness and headaches.  All other systems reviewed and are negative.     Allergies  Nsaids; Penicillins; Ivp dye; Ketorolac tromethamine; Tramadol; and Morphine and related  Home Medications   Prior to Admission medications   Medication Sig Start Date End Date Taking? Authorizing Provider  acetaminophen (TYLENOL) 500 MG tablet Take 1,000 mg by mouth every 6 (six) hours as needed for moderate pain or headache.   Yes Historical Provider, MD  oxyCODONE (OXY IR/ROXICODONE) 5 MG immediate release tablet Take 10 mg by mouth every 6 (six) hours as needed for severe pain.   Yes Historical Provider, MD   BP 156/90 mmHg  Pulse 114  Temp(Src) 98.2 F (36.8 C) (Oral)  Resp 20  SpO2 95% Physical Exam  Constitutional: He appears well-developed and well-nourished. No distress.  HENT:  Head: Normocephalic and atraumatic.  Eyes: Conjunctivae are normal.  Neck: Neck supple.  Cardiovascular: Normal rate, regular rhythm and normal heart sounds.   Pulmonary/Chest: Effort normal. No respiratory distress. He has no wheezes. He has no rales.  Abdominal: Soft. Bowel sounds are normal. He exhibits no distension. There is no tenderness. There is  no rebound.  Musculoskeletal: He exhibits no edema.  Swelling noted to the dorsal left hand. There is a palpable induration and fluctuance  Consistent with an abscess. There is a central opening in the abscess with purulent drainage. There is tender to palpation. Pain with range of motion of any of the  fingers. Cap refill and sensation intact distally. No swelling or induration over palmar surface of the hand. Healing incision to the right antecubital fossa. No evidence of infection in that area.  Neurological: He is alert.  Skin: Skin is warm and dry.  Nursing note and vitals reviewed.   ED Course  Procedures (including critical care time) Labs Review Labs Reviewed  CBC WITH DIFFERENTIAL/PLATELET - Abnormal; Notable for the following:    RBC 3.85 (*)    Hemoglobin 11.0 (*)    HCT 33.8 (*)    Platelets 512 (*)    All other components within normal limits  BASIC METABOLIC PANEL - Abnormal; Notable for the following:    Glucose, Bld 118 (*)    All other components within normal limits  I-STAT CG4 LACTIC ACID, ED - Abnormal; Notable for the following:    Lactic Acid, Venous 2.34 (*)    All other components within normal limits  URINE RAPID DRUG SCREEN, HOSP PERFORMED  SEDIMENTATION RATE  C-REACTIVE PROTEIN  I-STAT CG4 LACTIC ACID, ED    Imaging Review Dg Hand Complete Left  11/03/2015  CLINICAL DATA:  Left hand pain and swelling with an open abscess. Fever. Soft tissue wound along dorsal aspect the metacarpals. Initial encounter. EXAM: LEFT HAND - COMPLETE 3+ VIEW COMPARISON:  None. FINDINGS: Soft tissues of the hand are markedly swollen. An open wound is seen on the dorsal aspect of the hand at the level of mid metacarpals. No underlying radiopaque foreign body or soft tissue gas collection is identified. There is no fracture or dislocation. IMPRESSION: Intense soft tissue swelling with an open wound on the dorsal aspect of the left hand. The examination is otherwise negative. Electronically Signed   By: Inge Rise M.D.   On: 11/03/2015 13:58   I have personally reviewed and evaluated these images and lab results as part of my medical decision-making.   EKG Interpretation None      MDM   Final diagnoses:  Abscess of left hand    patient emergency department with the  knee swelling to the left hand, consistent with draining abscess. Will get labs, x-ray of the hand. This is most likely something that would need to be opened and further debrided in order. We'll start IV fluids and nausea medications.   Pt's other records under name Abigail Butts, MRN [id number]  4:47 PM Spoke with Dr. Amedeo Plenty, Accepts pt, asked to transfer to [facility] ED. Will take to OR.   Jeannett Senior, PA-C 11/08/15 0005  Jola Schmidt, MD 11/08/15 1056

## 2015-11-03 NOTE — ED Notes (Signed)
Pt was recently seen for a right brachial arm abscess. Has been having N/V/dizziness related to left hand abscess. Tmax 102.5 at home. Took 1000 mg Tylenol around 0730 this morning. Has left hand swelling and there is an open abscess with yellow drainage noted. No other c/c.

## 2015-11-04 ENCOUNTER — Encounter (HOSPITAL_COMMUNITY): Payer: Self-pay | Admitting: Orthopedic Surgery

## 2015-11-04 LAB — BASIC METABOLIC PANEL
ANION GAP: 7 (ref 5–15)
BUN: <5 mg/dL — ABNORMAL LOW (ref 6–20)
CO2: 26 mmol/L (ref 22–32)
Calcium: 8.2 mg/dL — ABNORMAL LOW (ref 8.9–10.3)
Chloride: 101 mmol/L (ref 101–111)
Creatinine, Ser: 0.75 mg/dL (ref 0.61–1.24)
GFR calc Af Amer: >60 mL/min (ref >60)
GFR calc non Af Amer: >60 mL/min (ref >60)
GLUCOSE: 94 mg/dL (ref 65–99)
POTASSIUM: 4 mmol/L (ref 3.5–5.1)
Sodium: 134 mmol/L — ABNORMAL LOW (ref 135–145)

## 2015-11-04 LAB — WOUND CULTURE
Gram Stain: NONE SEEN
Special Requests: NORMAL

## 2015-11-04 LAB — GENTAMICIN LEVEL, RANDOM: Gentamicin Rm: 0.9 ug/mL

## 2015-11-04 LAB — C-REACTIVE PROTEIN: CRP: 1.8 mg/dL — ABNORMAL HIGH (ref <1.0)

## 2015-11-04 LAB — SEDIMENTATION RATE: Sed Rate: 48 mm/hr — ABNORMAL HIGH (ref 0–16)

## 2015-11-04 NOTE — Op Note (Signed)
NAMETREVIOUS, FOUCHE NO.:  [id number]  MEDICAL RECORD NO.:  [id number]  LOCATION:  [id number]                        FACILITY:  [facility]  PHYSICIAN:  Satira Anis. Koda Routon, M.D.DATE OF BIRTH:  1991-02-01  DATE OF PROCEDURE: DATE OF DISCHARGE:                              OPERATIVE REPORT   PREOPERATIVE DIAGNOSES: 1. History of right elbow abscess with continued wound infectious     process. 2. Acute recent abscess with extensor tenosynovitis, purulent in     nature, left hand. 3. 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  POSTOPERATIVE DIAGNOSES: 1. History of right elbow abscess with continued wound infectious     process. 2. Acute recent abscess with extensor tenosynovitis, purulent in     nature, left hand. 3. 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  PROCEDURE: 1. Irrigation and debridement of skin and subcutaneous tissue, right     elbow. 2. Radical tenosynovectomy, left hand and wrist. 3. Drainage of deep abscess, left hand and wrist. 4. Fasciotomy, left hand. 5. Radical debridement of skin, subcutaneous tissue, tendon, paratenon     and muscle, left hand and wrist.  SURGEON:  Satira Anis. Amedeo Plenty, M.D.  ASSISTANT:  None.  COMPLICATIONS:  None.  ANESTHESIA:  General.  TOURNIQUET TIME:  Less than an hour on the left hand, 0 minutes on the right upper extremity.  INDICATIONS FOR THE PROCEDURE:  This patient is a 24 year old male, who previously underwent surgical I and D of his elbow.  This was treated with aggressive I and D through the [facility].  At that time, the patient denied IV drug abuse.  He ultimately improved and was treated as an outpatient in my office.  He had poor followup.  Today, he presented after leaving the [facility] on Sunday against medical advice and presented with worsening pain and problems.  Had a very long detailed discussion with the patient.  I challenged him on  his story and he thus admitted to injecting IV methamphetamine.  As it stands, he certainly has some very extreme challenges with drug history and family history of very challenging problem surrounding him.  I reviewed this with him at length.  Our task tonight of course is try and rid him of the infectious process.  OPERATION IN DETAIL:  The patient was taken to the procedural suite. After informed consent, he was given a general anesthetic.  Left upper extremity was prepped draped in usual sterile fashion with Betadine scrub and paint.  Following this, he underwent an incision dorsally. This was modified by Burner incision.  Following modified Bruner incision, cultures were taken, aerobic and anaerobic.  I performed I and D of skin, subcutaneous tissue, muscle, paratenon, and tendon as well as muscle.  At this time, I performed a radical tenosynovectomy of the extensor apparatus to the small ring, middle, and index fingers.  He had a significant phlegmon and infectious film around the tendons.  The patient had very careful meticulous tenosynovectomy and accomplished radical in nature.  Some of this was sent for culture in terms of the tenosynovitis involvement.  Following this, I performed fasciotomy in the muscle fascia.  The patient tolerated this well.  There were  no complicating features.  Once this was complete, I verified the muscle to be contract time without problems.  Following this, I placed 3 L of fluid into the area and then very loosely closed this area over drain, after hemostasis was secured and tourniquet deflated.  The patient tolerated this well.  There no complicating features.  Adaptic Xeroform, 4x4s, Kerlix, and a volar splint was applied. Following this, table was turned and the patient underwent isolation of sterile field about the right elbow.  All performed irrigation and debridement of skin, subcutaneous tissue, less than 100 square cm.  He tolerated  this well.  This was an excisional debridement with curette, knife, blade, and curettage measure.  The patient tolerated this well. He was dressed with a wet-to-dry dressing.  He awoke from surgery.  He will be admitted.  We will place him on gentamicin and vancomycin, given his penicillin allergy.  Await cultures and ask for behavior health consult.  These notes have been discussed and all questions have been encouraged and answered.  Redirecting our prior to try to help this gentleman although he does have some very significant challenges as we discussed with him today.     Satira Anis. Amedeo Plenty, M.D.     [facility]  D:  11/03/2015  T:  11/04/2015  Job:  [id number]

## 2015-11-04 NOTE — Progress Notes (Signed)
Patient ID: GIORGIO NAIL, male   DOB: 09/15/1991, 24 y.o.   MRN: [id number] We have discussed with the patient the issues regarding their infection to the extremity. We will continue antibiotics and await culture results. Often times it will take 3-5 days for cultures to become final. During this time we will typically have the patient on intravenous antibiotics until we can find a parenteral route of antibiotic regime specific for the bacteria or organism isolated. We have discussed with the patient the need for daily irrigation and debridement as well as therapy to the area. We have discussed with the patient the necessity of range of motion to the involved joints as discussed today. We have discussed with the patient the unpredictability of infections at times. We'll continue to work towards good pain control and restoration of function. The patient understands the need for meticulous wound care and the necessity of proper followup.  The possible complications of stiffness (loss of motion), resistant infection, possible deep bone infection, possible chronic pain issues, possible need for multiple surgeries and even amputation.  With this in mind the patient understands our goal is to eradicate the infection to quiesence. We will continue to work towards these goals.  The patient is alert and oriented in no acute distress. The patient complains of pain in the affected upper extremity.  The patient is noted to have a normal HEENT exam. Lung fields show equal chest expansion and no shortness of breath. Abdomen exam is nontender without distention. Lower extremity examination does not show any fracture dislocation or blood clot symptoms. Pelvis is stable and the neck and back are stable and nontender.  Await Cultures and begin hydro for the right arm and dressing changes for the left-wet to dry  Milanni Ayub MD

## 2015-11-04 NOTE — Progress Notes (Signed)
Pt upset and threatening to leave due to him wanting IV pain medication. I explained that he had pills available. Pt refused oxycodone and robaxin stating that he did not like the way muscle relaxer's make him feel. He begin to cry and yell about leaving AMA and that he would not take any PO meds. I explained the consequences of leaving AMA. Paged [place], Pa for IV order of pain med. She ordered 1mg  of Dilaudid q2h for pain. After adm. Pt became more calm and cooperative. Spoke with patient about taking the PO meds to help with pain. Cold therapy was applied as well. Pt agreed to PO med. Pt resting in bed. Call bed within reach. No apparent signs of distress, call bell within reach. Will continue to monitor

## 2015-11-04 NOTE — Progress Notes (Signed)
Utilization review completed.  

## 2015-11-05 NOTE — Progress Notes (Signed)
Physical Therapy Wound Treatment Patient Details  Name: GARRISON MICHIE MRN: [id number] Date of Birth: Sep 02, 1991  Today's Date: 11/05/2015 Time: 1013-1050 Time Calculation (min): 37 min  Subjective  Subjective: The left hand hurts much worse than my elbow Patient and Family Stated Goals: Heal Date of Onset:  (Underwent I&D 12/13) Prior Treatments: I&D right elbow approx 3 weeks ago  Pain Score: "My hand is bad, I barely notice my elbow" no value given. Pt premedicated   Wound Assessment  Wound / Incision (Open or Dehisced) 11/05/15 Incision - Open Elbow Right Antecubital fossa (Active)  Dressing Type Gauze (Comment);Compression wrap;Moist to dry 11/05/2015 10:00 AM  Dressing Changed Changed 11/05/2015 10:00 AM  Dressing Status Clean;Dry;Intact 11/05/2015 10:00 AM  Site / Wound Assessment Pink;Red;Yellow 11/05/2015 10:00 AM  % Wound base Red or Granulating 60% 11/05/2015 10:00 AM  % Wound base Yellow 40% 11/05/2015 10:00 AM  Peri-wound Assessment Intact;Induration 11/05/2015 10:00 AM  Wound Length (cm) 6.1 cm 11/05/2015 10:00 AM  Wound Width (cm) 1.3 cm 11/05/2015 10:00 AM  Wound Depth (cm) 0.1 cm 11/05/2015 10:00 AM  Margins Unattached edges (unapproximated) 11/05/2015 10:00 AM  Closure None 11/05/2015 10:00 AM  Drainage Amount Moderate 11/05/2015 10:00 AM  Drainage Description Serosanguineous;No odor 11/05/2015 10:00 AM  Treatment Hydrotherapy (Pulse lavage);Packing (Saline gauze) 11/05/2015 10:00 AM     Incision (Closed) 11/03/15 Hand Left (Active)  Dressing Type Compression wrap;Gauze (Comment);Moist to dry 11/05/2015 10:00 AM  Dressing Changed 11/05/2015 10:00 AM  Site / Wound Assessment Red 11/05/2015 10:00 AM  Margins Attached edges (approximated) 11/05/2015 10:00 AM  Drainage Amount Copious 11/05/2015 10:00 AM  Drainage Description Serosanguineous 11/05/2015 10:00 AM  Treatment Cleansed 11/05/2015 10:00 AM      Hydrotherapy Pulsed lavage therapy - wound  location: Rt antecubital fossa Pulsed Lavage with Suction (psi): 4 psi Pulsed Lavage with Suction - Normal Saline Used: 500 mL Pulsed Lavage Tip: Tip with splash shield   Wound Assessment and Plan  Wound Therapy - Assess/Plan/Recommendations Wound Therapy - Clinical Statement: Minimal thin layer of slough covernig Rt antecubital fossa. Hydrotherapy with pulsed lavage to Rt antecubital fossa with dressing change and education for self dressing changes. Lt hand dressing changed, educated, dried blood cleaned surrounding wound. Educated on gentle ROM exercises. Wound Therapy - Functional Problem List: Decreased strength/ROM resulting from wounds preventing daily functional tasks involving UEs. Factors Delaying/Impairing Wound Healing: Polypharmacy;Substance abuse Hydrotherapy Plan: Dressing change;Patient/family education;Pulsatile lavage with suction Wound Therapy - Frequency: 6X / week Wound Therapy - Follow Up Recommendations: [place] Wound Plan: See above  Wound Therapy Goals- Improve the function of patient's integumentary system by progressing the wound(s) through the phases of wound healing (inflammation - proliferation - remodeling) by: Decrease Necrotic Tissue to: 0 Decrease Necrotic Tissue - Progress: Goal set today Increase Granulation Tissue to: 100 Increase Granulation Tissue - Progress: Goal set today Improve Drainage Characteristics: Min;Serous Improve Drainage Characteristics - Progress: Goal set today Goals/treatment plan/discharge plan were made with and agreed upon by patient/family: Yes Time For Goal Achievement: 7 days Wound Therapy - Potential for Goals: Good  Goals will be updated until maximal potential achieved or discharge criteria met.  Discharge criteria: when goals achieved, discharge from hospital, MD decision/surgical intervention, no progress towards goals, refusal/missing three consecutive treatments without notification or medical reason.  GP      Candie Mile S 11/05/2015, 11:20 AM  Elayne Snare, [place]

## 2015-11-05 NOTE — Progress Notes (Signed)
Pharmacy Antibiotic Follow-up Note  Travis Callahan is a 24 y.o. year-old male admitted on 11/03/2015.  The patient is currently on day 3 of Gentamycin and Vancomycin for L hand abscess s/p debridement in OR on 12/13 .  Assessment/Plan: 1. Continue gentamycin 500 mg Q 24H 2. Continue vancomycin 1000 mg Q8H; will obtain level prior to 12/16 am dose to evaluate current dosing regimen 3. BMP for 12/16 4. Await pending micro data and narrow abx as feasible    Recent Labs Lab 11/03/15 1338  WBC 8.8    Recent Labs Lab 11/03/15 1338 11/04/15 0530  CREATININE 0.77 0.75   Antimicrobials this admission: 12/13 Gentamycin >>  12/13 Vancomycin >>   Levels/dose changes this admission: 12/14: gentamycin random: 0.9 (drawn ~ 18 hours after 2nd dose given)  Microbiology results: 12/13 abscess (op cx): px   Thank you for allowing pharmacy to be a part of this patient's care.  Vincenza Hews, PharmD, BCPS 11/05/2015, 12:04 PM Pager: [phone]

## 2015-11-05 NOTE — Addendum Note (Signed)
Addendum  created 11/05/15 1127 by Donnella Bi, RN   Modules edited: Anesthesia Responsible Staff

## 2015-11-06 ENCOUNTER — Encounter (HOSPITAL_COMMUNITY): Payer: Self-pay | Admitting: Orthopedic Surgery

## 2015-11-06 LAB — CULTURE, BLOOD (ROUTINE X 2)
CULTURE: NO GROWTH
Culture: NO GROWTH

## 2015-11-06 LAB — BASIC METABOLIC PANEL
ANION GAP: 9 (ref 5–15)
BUN: 10 mg/dL (ref 6–20)
CALCIUM: 9.2 mg/dL (ref 8.9–10.3)
CO2: 29 mmol/L (ref 22–32)
Chloride: 98 mmol/L — ABNORMAL LOW (ref 101–111)
Creatinine, Ser: 0.78 mg/dL (ref 0.61–1.24)
GLUCOSE: 98 mg/dL (ref 65–99)
POTASSIUM: 4.2 mmol/L (ref 3.5–5.1)
SODIUM: 136 mmol/L (ref 135–145)

## 2015-11-06 NOTE — Progress Notes (Signed)
Physical Therapy Wound Treatment Patient Details  Name: Travis Callahan MRN: [id number] Date of Birth: 14-Apr-1991  Today's Date: 11/06/2015 Time: 5681-2751 Time Calculation (min): 27 min  Subjective  Subjective: Left hand is sore. Patient and Family Stated Goals: Heal Date of Onset:  (Underwent I&D 12/13) Prior Treatments: I&D right elbow approx 3 weeks ago  Pain Score:   Pre-medicated - Complains of pain in Lt hand although states pain improves during hydrotherapy  Wound Assessment     Wound / Incision (Open or Dehisced) 11/06/15 Incision - Open Hand Left Dorsal surface (Active)  Dressing Type Compression wrap;Gauze (Comment);Moist to dry 11/06/2015  4:00 PM  Dressing Changed Changed 11/06/2015  4:00 PM  Dressing Status Clean;Dry;Intact 11/06/2015  4:00 PM  Site / Wound Assessment Red 11/06/2015  4:00 PM  % Wound base Red or Granulating 100% 11/06/2015  4:00 PM  % Wound base Yellow 0% 11/06/2015  4:00 PM  Peri-wound Assessment Intact 11/06/2015  4:00 PM  Wound Length (cm) 4.5 cm 11/06/2015  4:00 PM  Wound Width (cm) 0.4 cm 11/06/2015  4:00 PM  Wound Depth (cm) 0.2 cm 11/06/2015  4:00 PM  Margins Unattached edges (unapproximated) 11/06/2015  4:00 PM  Closure Sutures 11/06/2015  4:00 PM  Drainage Amount Moderate 11/06/2015  4:00 PM  Drainage Description Serosanguineous 11/06/2015  4:00 PM  Treatment Cleansed;Hydrotherapy (Pulse lavage);Packing (Saline gauze) 11/06/2015  4:00 PM  Right antecubital fossa d/c from hydrotherapy this AM. PA- Aaron Edelman, changed dressing and this was still clean when PT arrived for hydrotherapy. We will address dressing changes for right elbow again tomorrow morning.  Hydrotherapy Pulsed lavage therapy - wound location: Lt hand Pulsed Lavage with Suction (psi): 4 psi Pulsed Lavage with Suction - Normal Saline Used: 500 mL Pulsed Lavage Tip: Tip with splash shield   Wound Assessment and Plan  Wound Therapy - Assess/Plan/Recommendations Wound  Therapy - Clinical Statement: Lt hand with mild edema. Appears very healthy with minimal erythema. Guarded with finger ROM although educated on importance of frequent mobility with this hand, educated on exercises to perform. Sutures in place, with moderate but healthy appearing drainage. Rt elbow dressed earlier with d/c of hydrotherapy to this limb. Wound Therapy - Functional Problem List: Decreased strength/ROM resulting from wounds preventing daily functional tasks involving UEs. Factors Delaying/Impairing Wound Healing: Polypharmacy;Substance abuse Hydrotherapy Plan: Dressing change;Patient/family education;Pulsatile lavage with suction Wound Therapy - Frequency: 6X / week Wound Therapy - Follow Up Recommendations:  (per surgeon) Wound Plan: See above  Wound Therapy Goals- Improve the function of patient's integumentary system by progressing the wound(s) through the phases of wound healing (inflammation - proliferation - remodeling) by: Decrease Necrotic Tissue - Progress: Discontinued (comment) (hydrotherapy for Rt elbow d/c) Increase Granulation Tissue - Progress: Discontinued (comment) (hydrotherapy d/c for Rt elbow) Decrease Length/Width/Depth by (cm): 2 cm (Lt hand) Decrease Length/Width/Depth - Progress: Goal set today Improve Drainage Characteristics: Min;Serous (Lt hand) Improve Drainage Characteristics - Progress: Goal set today (d/c goal for Rt elbow due to d/c of hydrotherapy) Goals/treatment plan/discharge plan were made with and agreed upon by patient/family: Yes Time For Goal Achievement: 7 days Wound Therapy - Potential for Goals: Good  Goals will be updated until maximal potential achieved or discharge criteria met.  Discharge criteria: when goals achieved, discharge from hospital, MD decision/surgical intervention, no progress towards goals, refusal/missing three consecutive treatments without notification or medical reason.  GP     Candie Mile S 11/06/2015, 4:54  PM Camille Bal Riggins, [employer]

## 2015-11-06 NOTE — Progress Notes (Signed)
Subjective: 3 Days Post-Op Procedure(s) (LRB): IRRIGATION AND DEBRIDEMENT RIGHT ELBOW  (Right) IRRIGATION AND DEBRIDEMENT LEFT HAND ABSCESS (Left) Patient is awake, finishing breakfast. He is doing fairly well overall. He describes the right elbow as "itchy" left hand is sore overall. He denies nausea, vomiting, fever or chills   Objective: Vital signs in last 24 hours: Temp:  [97.5 F (36.4 C)-98.2 F (36.8 C)] 97.9 F (36.6 C) (12/16 0506) Pulse Rate:  [67-76] 67 (12/16 0506) Resp:  [16-17] 17 (12/16 0506) BP: (131-149)/(70-75) 149/70 mmHg (12/16 0506) SpO2:  [98 %] 98 % (12/16 0506)  Intake/Output from previous day: 12/15 0701 - 12/16 0700 In: 720 [P.O.:720] Out: 1625 [Urine:1625] Intake/Output this shift:     Recent Labs  11/03/15 1338  HGB 11.0*    Recent Labs  11/03/15 1338  WBC 8.8  RBC 3.85*  HCT 33.8*  PLT 512*    Recent Labs  11/04/15 0530 11/06/15 0501  NA 134* 136  K 4.0 4.2  CL 101 98*  CO2 26 29  BUN <5* 10  CREATININE 0.75 0.78  GLUCOSE 94 98  CALCIUM 8.2* 9.2   No results for input(s): LABPT, INR in the last 72 hours. Culture shows no growth to date Examination of the left upper extremity: Dressings are removed drains are pulled without difficulties. He has mild edema there is no signs of ascending cellulitis present is very apprehensive to perform any flexion or extension of the hand, there is only mild serous drainage from the wounds Right elbow/antecubital region: The granulation tissue is present there is no complicating features of infection at this juncture  Assessment/Plan: 3 Days Post-Op Procedure(s) (LRB): IRRIGATION AND DEBRIDEMENT RIGHT ELBOW  (Right) IRRIGATION AND DEBRIDEMENT LEFT HAND ABSCESS (Left) Have discussed with him at continuing of wound care. We will have therapy perform hydrotherapy to the left hand followed by wet-to-dry dressing changes, we have encouraged him to perform active range of motion of the digits about  the left hand with flexion and extension as well as passive range of motion. In regards to the right elbow this looks very well overall we'll simply perform wet-to-dry dressing changes. The patient will be talked wet-to-dry dressing changes about the wounds of the left hand and a continuation wet-to-dry dressing changes about the right elbow. We will await final cultures and most likely discharge him home potentially tomorrow. All questions were encouraged and answered.  Elias Bordner L 11/06/2015, 8:18 AM

## 2015-11-07 LAB — CULTURE, ROUTINE-ABSCESS

## 2015-11-07 MED ORDER — CIPROFLOXACIN HCL 500 MG PO TABS: 500.0000 mg | ORAL_TABLET | Freq: Two times a day (BID) | ORAL | Status: DC

## 2015-11-07 MED ORDER — OXYCODONE HCL 5 MG PO CAPS: 10.0000 mg | ORAL_CAPSULE | ORAL | Status: DC | PRN

## 2015-11-07 NOTE — Progress Notes (Signed)
Hydrotherapy/PT Cancellation Note  Patient Details Name: Travis Callahan MRN: [id number] DOB: 01/24/1991   Cancelled Treatment:    Reason Eval/Treat Not Completed: Patient refused. Pt reports bad night and that he was in pain. Pt had been premedicated with pain meds prior to our arrival.    [facility] 11/07/2015, 8:44 AM [place]

## 2015-11-07 NOTE — Progress Notes (Signed)
Patient states that he need to go and cannot wait for social work's consult for outpatient behavior health rehab. SW Estill Bamberg is aware about patient decision to go against medical advice. I gave patient his pain prescriptions and pulled out his IV without complication. I asked the patient to wait for NA for discharge transport. Patient walked out  our unit by himself.

## 2015-11-07 NOTE — Discharge Instructions (Signed)
Please change your dressings daily and call to see [place] in 6 days  Keep bandage clean and dry.  Call for any problems.  No smoking.  Criteria for driving a car: you should be off your pain medicine for 7-8 hours, able to drive one handed(confident), thinking clearly and feeling able in your judgement to drive. Continue elevation as it will decrease swelling.  If instructed by MD move your fingers within the confines of the bandage/splint.  Use ice if instructed by your MD. Call immediately for any sudden loss of feeling in your hand/arm or change in functional abilities of the extremity.We recommend that you to take vitamin C 1000 mg a day to promote healing. We also recommend that if you require  pain medicine that you take a stool softener to prevent constipation as most pain medicines will have constipation side effects. We recommend either Peri-Colace or Senokot and recommend that you also consider adding MiraLAX to prevent the constipation affects from pain medicine if you are required to use them. These medicines are over the counter and maybe purchased at a local pharmacy. A cup of yogurt and a probiotic can also be helpful during the recovery process as the medicines can disrupt your intestinal environment.

## 2015-11-07 NOTE — Discharge Summary (Signed)
  Patient has been seen and examined. Patient has pain appropriate to his injury/process. Patient denies new complaints at this present time. I have discussed the care pathway with nursing staff. Patient is appropriate and alert.  We reviewed vital signs and intake output which are stable.  The upper extremity is neurovascularly intact. Refill is normal. There is no signs of compartment syndrome. There is no signs of dystrophy. He has some numbness about the dorsal ulnar aspect of his hand. He has much improved soft tissue parameters and no active infectious drainage.  I discussed range of motion desensitization and discussed with him bandage changes to the right elbow and left hand. We perform this at bedside.  I have spent a  great deal of time discussing range of motion edema control and other techniques to decrease edema and promote flexion extension of the fingers. Patient understands the importance of elevation range of motion massage and other measures to lessen pain and prevent swelling.  We have also discussed immobilization to appropriate areas involved.  We have discussed with the patient shoulder range of motion to prevent adhesive capsulitis.  The remainder of the examination is normal today without complicating feature.    Patient will be discharged home. Will plan to see the patient back in the office as per discharge instructions (please see discharge instructions).  Patient had an uneventful hospital course. At the time of discharge patient is stable awake alert and oriented in no acute distress. Regular diet will be continued and has been tolerated. Patient will notify should have problems occur. There is no signs of DVT infection or other complication at this juncture.  All questions have been incurred and answered.  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 We will see him back in the office next week. We will have him to just patient in outpatient drug rehabilitation. He understands all issues and plans. Gonzalo Waymire MD

## 2015-11-08 LAB — FUNGUS CULTURE W SMEAR: Fungal Smear: NONE SEEN

## 2015-11-08 LAB — ANAEROBIC CULTURE

## 2015-11-08 NOTE — Progress Notes (Signed)
SW attempted to reach Pt at the number he provided RN.  Informed that the number is not working.  SW mailed substance abuse treatment resources to Pt.  Bernita Raisin, [facility] [phone]

## 2015-11-13 ENCOUNTER — Encounter (HOSPITAL_COMMUNITY): Payer: Self-pay | Admitting: *Deleted

## 2015-11-13 ENCOUNTER — Emergency Department (HOSPITAL_COMMUNITY): Payer: Self-pay

## 2015-11-13 ENCOUNTER — Emergency Department (HOSPITAL_COMMUNITY)
Admission: EM | Admit: 2015-11-13 | Discharge: 2015-11-13 | Disposition: A | Discharge status: Home / Self Care | Payer: Self-pay | Attending: Emergency Medicine | Admitting: Emergency Medicine

## 2015-11-13 DIAGNOSIS — Z880 Allergy status to penicillin: Secondary | ICD-10-CM | POA: Insufficient documentation

## 2015-11-13 DIAGNOSIS — K259 Gastric ulcer, unspecified as acute or chronic, without hemorrhage or perforation: Secondary | ICD-10-CM | POA: Insufficient documentation

## 2015-11-13 DIAGNOSIS — R6889 Other general symptoms and signs: Secondary | ICD-10-CM

## 2015-11-13 DIAGNOSIS — F17210 Nicotine dependence, cigarettes, uncomplicated: Secondary | ICD-10-CM | POA: Insufficient documentation

## 2015-11-13 DIAGNOSIS — Z8614 Personal history of Methicillin resistant Staphylococcus aureus infection: Secondary | ICD-10-CM | POA: Insufficient documentation

## 2015-11-13 DIAGNOSIS — Z792 Long term (current) use of antibiotics: Secondary | ICD-10-CM | POA: Insufficient documentation

## 2015-11-13 DIAGNOSIS — M79642 Pain in left hand: Secondary | ICD-10-CM | POA: Insufficient documentation

## 2015-11-13 DIAGNOSIS — Z79899 Other long term (current) drug therapy: Secondary | ICD-10-CM | POA: Insufficient documentation

## 2015-11-13 DIAGNOSIS — Z872 Personal history of diseases of the skin and subcutaneous tissue: Secondary | ICD-10-CM | POA: Insufficient documentation

## 2015-11-13 LAB — CBC WITH DIFFERENTIAL/PLATELET
BASOS ABS: 0.1 10*3/uL (ref 0.0–0.1)
Basophils Relative: 1 %
EOS ABS: 0.1 10*3/uL (ref 0.0–0.7)
EOS PCT: 2 %
HCT: 34.8 % — ABNORMAL LOW (ref 39.0–52.0)
Hemoglobin: 11.6 g/dL — ABNORMAL LOW (ref 13.0–17.0)
Lymphocytes Relative: 43 %
Lymphs Abs: 3.1 10*3/uL (ref 0.7–4.0)
MCH: 28.9 pg (ref 26.0–34.0)
MCHC: 33.3 g/dL (ref 30.0–36.0)
MCV: 86.6 fL (ref 78.0–100.0)
MONO ABS: 0.8 10*3/uL (ref 0.1–1.0)
Monocytes Relative: 12 %
Neutro Abs: 2.9 10*3/uL (ref 1.7–7.7)
Neutrophils Relative %: 42 %
PLATELETS: 362 10*3/uL (ref 150–400)
RBC: 4.02 MIL/uL — AB (ref 4.22–5.81)
RDW: 14.7 % (ref 11.5–15.5)
WBC: 7.1 10*3/uL (ref 4.0–10.5)

## 2015-11-13 LAB — C-REACTIVE PROTEIN: CRP: 0.9 mg/dL (ref <1.0)

## 2015-11-13 LAB — BASIC METABOLIC PANEL
ANION GAP: 11 (ref 5–15)
BUN: 22 mg/dL — AB (ref 6–20)
CALCIUM: 9.6 mg/dL (ref 8.9–10.3)
CO2: 23 mmol/L (ref 22–32)
Chloride: 103 mmol/L (ref 101–111)
Creatinine, Ser: 1.31 mg/dL — ABNORMAL HIGH (ref 0.61–1.24)
GFR calc Af Amer: >60 mL/min (ref >60)
GLUCOSE: 116 mg/dL — AB (ref 65–99)
POTASSIUM: 3.9 mmol/L (ref 3.5–5.1)
SODIUM: 137 mmol/L (ref 135–145)

## 2015-11-13 LAB — SEDIMENTATION RATE: SED RATE: 34 mm/h — AB (ref 0–16)

## 2015-11-13 LAB — I-STAT CG4 LACTIC ACID, ED: LACTIC ACID, VENOUS: 1.39 mmol/L (ref 0.5–2.0)

## 2015-11-13 MED ORDER — OXYCODONE HCL 5 MG PO TABS
5.0000 mg | ORAL_TABLET | Freq: Once | ORAL | Status: AC
  Administered 2015-11-13: 5 mg via ORAL
  Filled 2015-11-13: qty 1

## 2015-11-13 MED ORDER — HYDROMORPHONE HCL 1 MG/ML IJ SOLN
1.0000 mg | Freq: Once | INTRAMUSCULAR | Status: AC
  Administered 2015-11-13: 1 mg via INTRAVENOUS
  Filled 2015-11-13: qty 1

## 2015-11-13 MED ORDER — ONDANSETRON HCL 4 MG/2ML IJ SOLN
4.0000 mg | Freq: Once | INTRAMUSCULAR | Status: AC
  Administered 2015-11-13: 4 mg via INTRAVENOUS
  Filled 2015-11-13: qty 2

## 2015-11-13 MED ORDER — SODIUM CHLORIDE 0.9 % IV BOLUS (SEPSIS)
1000.0000 mL | Freq: Once | INTRAVENOUS | Status: AC
  Administered 2015-11-13: 1000 mL via INTRAVENOUS

## 2015-11-13 NOTE — ED Notes (Signed)
Pt verbalized understanding of d/c instructions and has no further questions. Pt stable and NAD.  

## 2015-11-13 NOTE — ED Notes (Signed)
The pt has  Surgery on his rt arm 2 weeks ago  And surgery on his lt hand 9 days ago.  Today while driving he felt pain and much drainage came from the lt hand incision  Swollen and red ness

## 2015-11-13 NOTE — ED Provider Notes (Signed)
CSN: [id number]     Arrival date & time 11/13/15  [id number] History  By signing my name below, I, Evelene Croon, attest that this documentation has been prepared under the direction and in the presence of [name], MD . Electronically Signed: Evelene Croon, Scribe. 11/13/2015. 1:38 AM.     Chief Complaint  Patient presents with  . Wound Infection   The history is provided by the patient. No language interpreter was used.     HPI Comments:  SHAMAN MALEY is a 24 y.o. male with a history of MRSA, and left hand abscess irrigation and debridement on 11/03/15 who presents to the Emergency Department complaining of moderate pain to wound site on left hand. Pt notes he had 2 sutures removed from the site 2 days ago. He was driving today ~ [id number] and felt a pop at the site. He denies drainage.  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 Pt is currenlty on ciprofloxacin. He denies fever. No alleviating factors noted.  Bunk Foss  Past Medical History  Diagnosis Date  . MRSA (methicillin resistant Staphylococcus aureus)   . Peritonitis ([place])   . Gastric ulcer   . Stomach ulcer   . Crohn's   . Bowel obstruction ([place])   . Ulcer    Past Surgical History  Procedure Laterality Date  . Gastric ulcer perforation      repair   . Incision and drainage of wound Right 10/11/2015    Procedure: IRRIGATION AND DEBRIDEMENT right upper arm;  Surgeon: Roseanne Kaufman, MD;  Location: [facility];  Service: Orthopedics;  Laterality: Right;  . Stomach surgery    . Appendectomy    . Abdominal surgery    . I&d extremity Right 11/03/2015    Procedure: IRRIGATION AND DEBRIDEMENT RIGHT ELBOW ;  Surgeon: Roseanne Kaufman, MD;  Location: [place];  Service: Orthopedics;  Laterality: Right;  . Irrigation and debridement abscess Left 11/03/2015    Procedure: IRRIGATION AND DEBRIDEMENT LEFT HAND ABSCESS;  Surgeon: Roseanne Kaufman, MD;  Location: [place];  Service:  Orthopedics;  Laterality: Left;   Family History  Problem Relation Age of Onset  . Heart failure Mother   . Congestive Heart Failure Mother    Social History  Substance Use Topics  . Smoking status: Current Every Day Smoker -- 0.50 packs/day    Types: Cigarettes  . Smokeless tobacco: None  . Alcohol Use: No     Comment: socially    Review of Systems 10 systems reviewed and all are negative for acute change except as noted in the HPI.  Allergies  Nsaids; Penicillins; Penicillins; Ivp dye; Ketorolac tromethamine; Morphine and related; Nsaids; Tramadol; and Morphine and related  Home Medications   Prior to Admission medications   Medication Sig Start Date End Date Taking? Authorizing Provider  acetaminophen (TYLENOL) 500 MG tablet Take 1,000 mg by mouth every 6 (six) hours as needed for moderate pain or headache.    Historical Provider, MD  acetaminophen (TYLENOL) 500 MG tablet Take 1,000 mg by mouth every 6 (six) hours as needed for moderate pain.    Historical Provider, MD  ciprofloxacin (CIPRO) 500 MG tablet Take 1 tablet (500 mg total) by mouth 2 (two) times daily. Patient taking differently: Take 500 mg by mouth 2 (two) times daily. For 14 days 11-23 to 12-07 10/14/15   Velvet Bathe, MD  ciprofloxacin (CIPRO) 500 MG tablet Take 1 tablet (500 mg total) by mouth 2 (two) times daily.  11/07/15   Roseanne Kaufman, MD  docusate sodium (COLACE) 100 MG capsule Take 1 capsule (100 mg total) by mouth 2 (two) times daily. 10/14/15   Velvet Bathe, MD  ondansetron (ZOFRAN-ODT) 4 MG disintegrating tablet Take 4 mg by mouth every 8 (eight) hours as needed for nausea or vomiting.    Historical Provider, MD  oxyCODONE (OXY IR/ROXICODONE) 5 MG immediate release tablet Take 10 mg by mouth every 6 (six) hours as needed for severe pain.    Historical Provider, MD  oxyCODONE (OXY IR/ROXICODONE) 5 MG immediate release tablet Take 1-2 tablets (5-10 mg total) by mouth every 3 (three) hours as needed for  moderate pain. 10/14/15   Velvet Bathe, MD  oxycodone (OXY-IR) 5 MG capsule Take 2 capsules (10 mg total) by mouth every 4 (four) hours as needed. 11/07/15   Roseanne Kaufman, MD  pantoprazole (PROTONIX) 40 MG tablet Take 40 mg by mouth 2 (two) times daily.    Historical Provider, MD  promethazine (PHENERGAN) 12.5 MG tablet Take 12.5 mg by mouth every 6 (six) hours as needed for nausea or vomiting.    Historical Provider, MD  promethazine (PHENERGAN) 25 MG suppository Place 25 mg rectally every 6 (six) hours as needed for nausea or vomiting.    Historical Provider, MD   BP 128/91 mmHg  Pulse 118  Temp(Src) 98.5 F (36.9 C) (Oral)  Resp 16  SpO2 100% Physical Exam  Constitutional: He is oriented to person, place, and time. Vital signs are normal. He appears well-developed and well-nourished.  Non-toxic appearance. He does not appear ill. No distress.  HENT:  Head: Normocephalic and atraumatic.  Nose: Nose normal.  Mouth/Throat: Oropharynx is clear and moist. No oropharyngeal exudate.  Eyes: Conjunctivae and EOM are normal. Pupils are equal, round, and reactive to light. No scleral icterus.  Neck: Normal range of motion. Neck supple. No tracheal deviation, no edema, no erythema and normal range of motion present. No thyroid mass and no thyromegaly present.  Cardiovascular: Normal rate, regular rhythm, S1 normal, S2 normal, normal heart sounds, intact distal pulses and normal pulses.  Exam reveals no gallop and no friction rub.   No murmur heard. Pulmonary/Chest: Effort normal and breath sounds normal. No respiratory distress. He has no wheezes. He has no rhonchi. He has no rales.  Abdominal: Soft. Normal appearance and bowel sounds are normal. He exhibits no distension, no ascites and no mass. There is no hepatosplenomegaly. There is no tenderness. There is no rebound, no guarding and no CVA tenderness.  Musculoskeletal: Normal range of motion. He exhibits no edema or tenderness.   Lymphadenopathy:    He has no cervical adenopathy.  Neurological: He is alert and oriented to person, place, and time. He has normal strength. No cranial nerve deficit or sensory deficit.  Skin: Skin is warm, dry and intact. No petechiae and no rash noted. He is not diaphoretic. No pallor.  Dorsum of left hand: 5cm long wound gaping with tendon exposure mild warmth and erythema surrounding the site; no purulence Exquisitely tender to palpation   Right AC with well healing 7cm wound; no erythema, tenderness or drainage  Psychiatric: He has a normal mood and affect. His behavior is normal. Judgment normal.  Nursing note and vitals reviewed.   ED Course  Procedures   DIAGNOSTIC STUDIES:  Oxygen Saturation is 100% on RA, normal by my interpretation.    COORDINATION OF CARE:  1:12 AM Discussed treatment plan with pt at bedside and pt agreed to plan.  Labs Review Labs Reviewed  CBC WITH DIFFERENTIAL/PLATELET - Abnormal; Notable for the following:    RBC 4.02 (*)    Hemoglobin 11.6 (*)    HCT 34.8 (*)    All other components within normal limits  BASIC METABOLIC PANEL - Abnormal; Notable for the following:    Glucose, Bld 116 (*)    BUN 22 (*)    Creatinine, Ser 1.31 (*)    All other components within normal limits  SEDIMENTATION RATE - Abnormal; Notable for the following:    Sed Rate 34 (*)    All other components within normal limits  C-REACTIVE PROTEIN  I-STAT CG4 LACTIC ACID, ED    Imaging Review Dg Hand Complete Left  11/13/2015  CLINICAL DATA:  24 year old male with history of MRI safe and open wound with incision and drainage EXAM: LEFT HAND - COMPLETE 3+ VIEW COMPARISON:  Radiograph dated 11/03/2015 FINDINGS: There is no evidence of fracture or dislocation. There is no evidence of arthropathy or other focal bone abnormality. There is soft tissue swelling of the hand. There is skin irregularity over the dorsum of the hand, likely related to incision and drainage. No  radiopaque foreign object identified. Dressing noted over the dorsum of the hand. IMPRESSION: Soft tissue swelling and incision skin wound over the dorsum of the hand. No radiopaque foreign object. No osseous pathology. Electronically Signed   By: Anner Crete M.D.   On: 11/13/2015 02:25   I have personally reviewed and evaluated these images and lab results as part of my medical decision-making.   EKG Interpretation None      MDM   Final diagnoses:  None    Patient presents to the emergency department for hand pain. He had surgery on it, stitches were recently removed and the wound has popped open. He has severe tenderness to palpation. I do not see any drainage however there is mild erythema and warmth. There may be a postop infection. We'll obtain laboratory studies, he was given Dilaudid and Zofran for symptomatic control. Will obtain x-ray and consult hand surgery.  I spoke with on call physician for Dr. Amedeo Plenty, who recs for the patient to be seen in clinic first thing tomorrow morning.  He was instructed to call and obtain a walk in appointment.  He demonstrates good understanding.  He appears well and in NAD.  VS remain within his normal limits and he is safe for DC.  I personally performed the services described in this documentation, which was scribed in my presence. The recorded information has been reviewed and is accurate.     [name], MD 11/13/15 [phone]

## 2015-11-13 NOTE — Discharge Instructions (Signed)
Abscess Travis Callahan, You need to go see Dr. Amedeo Plenty in clinic first thing this morning for evaluation of your hand.  You may be developing a worsening infection.  If symptoms worsen, come back to the ED immediately.  Thank you. An abscess (boil or furuncle) is an infected area on or under the skin. This area is filled with yellowish-white fluid (pus) and other material (debris). HOME CARE   Only take medicines as told by your doctor.  If you were given antibiotic medicine, take it as directed. Finish the medicine even if you start to feel better.  If gauze is used, follow your doctor's directions for changing the gauze.  To avoid spreading the infection:  Keep your abscess covered with a bandage.  Wash your hands well.  Do not share personal care items, towels, or whirlpools with others.  Avoid skin contact with others.  Keep your skin and clothes clean around the abscess.  Keep all doctor visits as told. GET HELP RIGHT AWAY IF:   You have more pain, puffiness (swelling), or redness in the wound site.  You have more fluid or blood coming from the wound site.  You have muscle aches, chills, or you feel sick.  You have a fever. MAKE SURE YOU:   Understand these instructions.  Will watch your condition.  Will get help right away if you are not doing well or get worse.   This information is not intended to replace advice given to you by your health care provider. Make sure you discuss any questions you have with your health care provider.   Document Released: 04/25/2008 Document Revised: 05/08/2012 Document Reviewed: 01/21/2012 Elsevier Interactive Patient Education [employer].

## 2015-11-16 ENCOUNTER — Encounter (HOSPITAL_COMMUNITY): Payer: Self-pay | Admitting: *Deleted

## 2015-11-16 ENCOUNTER — Inpatient Hospital Stay (HOSPITAL_COMMUNITY)
Admission: EM | Admit: 2015-11-16 | Discharge: 2015-11-20 | DRG: 580 | Discharge status: Against Medical Advice | Payer: Self-pay | Attending: Orthopedic Surgery | Admitting: Orthopedic Surgery

## 2015-11-16 DIAGNOSIS — F1910 Other psychoactive substance abuse, uncomplicated: Secondary | ICD-10-CM | POA: Diagnosis present

## 2015-11-16 DIAGNOSIS — L089 Local infection of the skin and subcutaneous tissue, unspecified: Secondary | ICD-10-CM

## 2015-11-16 DIAGNOSIS — L02512 Cutaneous abscess of left hand: Principal | ICD-10-CM | POA: Diagnosis present

## 2015-11-16 DIAGNOSIS — Z880 Allergy status to penicillin: Secondary | ICD-10-CM

## 2015-11-16 DIAGNOSIS — K5090 Crohn's disease, unspecified, without complications: Secondary | ICD-10-CM | POA: Diagnosis present

## 2015-11-16 DIAGNOSIS — T148XXA Other injury of unspecified body region, initial encounter: Secondary | ICD-10-CM

## 2015-11-16 DIAGNOSIS — M659 Synovitis and tenosynovitis, unspecified: Secondary | ICD-10-CM | POA: Diagnosis present

## 2015-11-16 DIAGNOSIS — K219 Gastro-esophageal reflux disease without esophagitis: Secondary | ICD-10-CM | POA: Diagnosis present

## 2015-11-16 LAB — URINALYSIS, ROUTINE W REFLEX MICROSCOPIC
Bilirubin Urine: NEGATIVE
Glucose, UA: NEGATIVE mg/dL
Hgb urine dipstick: NEGATIVE
Ketones, ur: NEGATIVE mg/dL
LEUKOCYTES UA: NEGATIVE
NITRITE: NEGATIVE
PH: 6 (ref 5.0–8.0)
Protein, ur: NEGATIVE mg/dL
SPECIFIC GRAVITY, URINE: 1.006 (ref 1.005–1.030)

## 2015-11-16 LAB — COMPREHENSIVE METABOLIC PANEL
ALBUMIN: 4.1 g/dL (ref 3.5–5.0)
ALT: 29 U/L (ref 17–63)
AST: 24 U/L (ref 15–41)
Alkaline Phosphatase: 78 U/L (ref 38–126)
Anion gap: 12 (ref 5–15)
BUN: 9 mg/dL (ref 6–20)
CHLORIDE: 106 mmol/L (ref 101–111)
CO2: 25 mmol/L (ref 22–32)
CREATININE: 0.94 mg/dL (ref 0.61–1.24)
Calcium: 9.7 mg/dL (ref 8.9–10.3)
GFR calc Af Amer: >60 mL/min (ref >60)
GLUCOSE: 114 mg/dL — AB (ref 65–99)
POTASSIUM: 3.8 mmol/L (ref 3.5–5.1)
Sodium: 143 mmol/L (ref 135–145)
Total Bilirubin: 0.6 mg/dL (ref 0.3–1.2)
Total Protein: 8 g/dL (ref 6.5–8.1)

## 2015-11-16 LAB — I-STAT CG4 LACTIC ACID, ED: LACTIC ACID, VENOUS: 1.59 mmol/L (ref 0.5–2.0)

## 2015-11-16 LAB — CBC
HEMATOCRIT: 37.1 % — AB (ref 39.0–52.0)
Hemoglobin: 11.8 g/dL — ABNORMAL LOW (ref 13.0–17.0)
MCH: 27.8 pg (ref 26.0–34.0)
MCHC: 31.8 g/dL (ref 30.0–36.0)
MCV: 87.5 fL (ref 78.0–100.0)
Platelets: 368 10*3/uL (ref 150–400)
RBC: 4.24 MIL/uL (ref 4.22–5.81)
RDW: 14.5 % (ref 11.5–15.5)
WBC: 7.8 10*3/uL (ref 4.0–10.5)

## 2015-11-16 LAB — SURGICAL PCR SCREEN
MRSA, PCR: NEGATIVE
STAPHYLOCOCCUS AUREUS: POSITIVE — AB

## 2015-11-16 LAB — LIPASE, BLOOD: LIPASE: 19 U/L (ref 11–51)

## 2015-11-16 MED ORDER — FLEET ENEMA 7-19 GM/118ML RE ENEM: 1.0000 | ENEMA | Freq: Once | RECTAL | Status: DC | PRN

## 2015-11-16 MED ORDER — ACETAMINOPHEN 650 MG RE SUPP: 650.0000 mg | Freq: Four times a day (QID) | RECTAL | Status: DC | PRN

## 2015-11-16 MED ORDER — ACETAMINOPHEN 325 MG PO TABS: 650.0000 mg | ORAL_TABLET | Freq: Four times a day (QID) | ORAL | Status: DC | PRN

## 2015-11-16 MED ORDER — OXYCODONE-ACETAMINOPHEN 5-325 MG PO TABS
2.0000 | ORAL_TABLET | Freq: Once | ORAL | Status: DC
  Filled 2015-11-16: qty 2

## 2015-11-16 MED ORDER — BISACODYL 5 MG PO TBEC: 5.0000 mg | DELAYED_RELEASE_TABLET | Freq: Every day | ORAL | Status: DC | PRN

## 2015-11-16 MED ORDER — ONDANSETRON HCL 4 MG/2ML IJ SOLN
4.0000 mg | Freq: Four times a day (QID) | INTRAMUSCULAR | Status: DC | PRN
  Filled 2015-11-16 (×2): qty 2

## 2015-11-16 MED ORDER — ONDANSETRON HCL 4 MG PO TABS
8.0000 mg | ORAL_TABLET | Freq: Once | ORAL | Status: AC
  Administered 2015-11-16: 8 mg via ORAL
  Filled 2015-11-16: qty 2

## 2015-11-16 MED ORDER — OXYCODONE HCL 5 MG PO TABS: 5.0000 mg | ORAL_TABLET | ORAL | Status: DC | PRN

## 2015-11-16 MED ORDER — ALUM & MAG HYDROXIDE-SIMETH 200-200-20 MG/5ML PO SUSP: 30.0000 mL | Freq: Four times a day (QID) | ORAL | Status: DC | PRN

## 2015-11-16 MED ORDER — DOCUSATE SODIUM 100 MG PO CAPS
100.0000 mg | ORAL_CAPSULE | Freq: Two times a day (BID) | ORAL | Status: DC
  Administered 2015-11-16 – 2015-11-19 (×6): 100 mg via ORAL
  Filled 2015-11-16 (×6): qty 1

## 2015-11-16 MED ORDER — ONDANSETRON HCL 4 MG PO TABS: 4.0000 mg | ORAL_TABLET | Freq: Four times a day (QID) | ORAL | Status: DC | PRN

## 2015-11-16 MED ORDER — MORPHINE SULFATE (PF) 2 MG/ML IV SOLN: 1.0000 mg | INTRAVENOUS | Status: DC | PRN

## 2015-11-16 MED ORDER — SODIUM CHLORIDE 0.9 % IV SOLN
500.0000 mg | Freq: Four times a day (QID) | INTRAVENOUS | Status: DC
  Administered 2015-11-16 – 2015-11-20 (×13): 500 mg via INTRAVENOUS
  Filled 2015-11-16 (×19): qty 500

## 2015-11-16 MED ORDER — HYDROMORPHONE HCL 1 MG/ML IJ SOLN
1.0000 mg | INTRAMUSCULAR | Status: DC | PRN
  Administered 2015-11-16 – 2015-11-19 (×22): 2 mg via INTRAVENOUS
  Administered 2015-11-20: 1 mg via INTRAVENOUS
  Administered 2015-11-20: 2 mg via INTRAVENOUS
  Filled 2015-11-16 (×9): qty 2
  Filled 2015-11-16: qty 1
  Filled 2015-11-16 (×2): qty 2
  Filled 2015-11-16: qty 1
  Filled 2015-11-16 (×12): qty 2

## 2015-11-16 NOTE — Progress Notes (Signed)
Patient ID: ALFONSO ARROYOS, male   DOB: 1991-07-18, 24 y.o.   MRN: [id number] No PCP Per Patient Chief Complaint: Hand Wound  History: Pleasant 24 year old male pt with a chronic history of IV drug abuse.  Pt recently had a debreidment of the wound.  The pt reports that last week some of the staples came out and the wound opened.  Over the last few days he reports he has become purulent And he has felt feverish and nauseated.    Past Medical History  Diagnosis Date  . MRSA (methicillin resistant Staphylococcus aureus)   . Peritonitis ([place])   . Gastric ulcer   . Stomach ulcer   . Crohn's   . Bowel obstruction ([place])   . Ulcer     Allergies  Allergen Reactions  . Nsaids Anaphylaxis and Other (See Comments)    Ulcers   . Penicillins Anaphylaxis    Has patient had a PCN reaction causing immediate rash, facial/tongue/throat swelling, SOB or lightheadedness with hypotension: Yes Has patient had a PCN reaction causing severe rash involving mucus membranes or skin necrosis: No Has patient had a PCN reaction that required hospitalization No Has patient had a PCN reaction occurring within the last 10 years: No If all of the above answers are "NO", then may proceed with Cephalosporin use.   Marland Kitchen Penicillins Anaphylaxis    Has patient had a PCN reaction causing immediate rash, facial/tongue/throat swelling, SOB or lightheadedness with hypotension: Unknown Has patient had a PCN reaction causing severe rash involving mucus membranes or skin necrosis: Unknown Has patient had a PCN reaction that required hospitalization: already in hospital when found out   Has patient had a PCN reaction occurring within the last 10 years: No  If all of the above answers are "NO", then may proceed with Cephalosporin use.   Clementeen Hoof [Iodinated Diagnostic Agents] Other (See Comments)    Needs solumedrol and benadryl prior  . Ketorolac Tromethamine Hives, Itching and Other (See Comments)    STOMACH ULCER  .  Morphine And Related Hives and Itching  . Nsaids Other (See Comments)    ulcers  . Tramadol Hives, Itching and Other (See Comments)    Ulcer   . Morphine And Related Hives and Itching    No current facility-administered medications on file prior to encounter.   Current Outpatient Prescriptions on File Prior to Encounter  Medication Sig Dispense Refill  . acetaminophen (TYLENOL) 500 MG tablet Take 1,000 mg by mouth every 6 (six) hours as needed for moderate pain or headache.    . ciprofloxacin (CIPRO) 500 MG tablet Take 1 tablet (500 mg total) by mouth 2 (two) times daily. 28 tablet 0  . ondansetron (ZOFRAN-ODT) 4 MG disintegrating tablet Take 4 mg by mouth every 8 (eight) hours as needed for nausea or vomiting.    . promethazine (PHENERGAN) 25 MG suppository Place 25 mg rectally every 6 (six) hours as needed for nausea or vomiting.    Marland Kitchen oxycodone (OXY-IR) 5 MG capsule Take 2 capsules (10 mg total) by mouth every 4 (four) hours as needed. (Patient not taking: Reported on 11/16/2015) 40 capsule 0    Physical Exam: Filed Vitals:   11/16/15 1726 11/16/15 1730  BP:    Pulse: 125 122  Temp:    Resp: 12 26    Image: Dg Hand Complete Left  11/13/2015  CLINICAL DATA:  24 year old male with history of MRI safe and open wound with incision and drainage EXAM: LEFT  HAND - COMPLETE 3+ VIEW COMPARISON:  Radiograph dated 11/03/2015 FINDINGS: There is no evidence of fracture or dislocation. There is no evidence of arthropathy or other focal bone abnormality. There is soft tissue swelling of the hand. There is skin irregularity over the dorsum of the hand, likely related to incision and drainage. No radiopaque foreign object identified. Dressing noted over the dorsum of the hand. IMPRESSION: Soft tissue swelling and incision skin wound over the dorsum of the hand. No radiopaque foreign object. No osseous pathology. Electronically Signed   By: Anner Crete M.D.   On: 11/13/2015 02:25   Dg Hand  Complete Left  11/03/2015  CLINICAL DATA:  Left hand pain and swelling with an open abscess. Fever. Soft tissue wound along dorsal aspect the metacarpals. Initial encounter. EXAM: LEFT HAND - COMPLETE 3+ VIEW COMPARISON:  None. FINDINGS: Soft tissues of the hand are markedly swollen. An open wound is seen on the dorsal aspect of the hand at the level of mid metacarpals. No underlying radiopaque foreign body or soft tissue gas collection is identified. There is no fracture or dislocation. IMPRESSION: Intense soft tissue swelling with an open wound on the dorsal aspect of the left hand. The examination is otherwise negative. Electronically Signed   By: Inge Rise M.D.   On: 11/03/2015 13:64    A/P: 24 year old male pt with a  Hx of drug abuse with a infected hand wound  Admit pt Dr Amedeo Plenty will operate on pt tomorrow NPO after midnight Started broad spectrum anx per pharmacy recommendation   Agree with above Dr Amedeo Plenty is aware

## 2015-11-16 NOTE — ED Notes (Signed)
Attempted IV stick X 2  

## 2015-11-16 NOTE — Progress Notes (Signed)
Patient pulled out IV- IV team notified for patient is a hard stick. Will educate concerning IV

## 2015-11-16 NOTE — ED Provider Notes (Signed)
CSN: [id number]     Arrival date & time 11/16/15  1620 History   First MD Initiated Contact with Patient 11/16/15 1708     Chief Complaint  Patient presents with  . Wound Check  . Emesis  . Fever   HPI  Mr. Nabors is a 24 year old male with a past medical history of IV drug abuse and MRSA presenting with wound infection. He had irrigation and debridement of a left hand abscess on 12/13. He was seen in the emergency department 3 days ago for the same complaints. He followed up with Dr. Amedeo Plenty 3 days ago for a wound check. He had his sutures removed and was scheduled for another follow-up visit. He comes in today reporting increased gaping of his wound and purulent discharge. He reports compliance with his antibiotics and wound care instructions. He states that he has been nauseous over the past day and has been vomiting up his antibiotics (ciprofloxacin). He has tried Zofran and Phenergan at home without relief. He reports a home temperature of 103.5. Took 1 g of Tylenol 7 hours before presentation to the emergency department. He denies recent drug use. Denies chills, dizziness, syncope, headache, weakness, chest pain, shortness of breath, abdominal pain or diarrhea.  Past Medical History  Diagnosis Date  . MRSA (methicillin resistant Staphylococcus aureus)   . Peritonitis ([place])   . Gastric ulcer   . Stomach ulcer   . Crohn's   . Bowel obstruction ([place])   . Ulcer    Past Surgical History  Procedure Laterality Date  . Gastric ulcer perforation      repair   . Incision and drainage of wound Right 10/11/2015    Procedure: IRRIGATION AND DEBRIDEMENT right upper arm;  Surgeon: Roseanne Kaufman, MD;  Location: [facility];  Service: Orthopedics;  Laterality: Right;  . Stomach surgery    . Appendectomy    . Abdominal surgery    . I&d extremity Right 11/03/2015    Procedure: IRRIGATION AND DEBRIDEMENT RIGHT ELBOW ;  Surgeon: Roseanne Kaufman, MD;  Location: [facility];  Service: Orthopedics;   Laterality: Right;  . Irrigation and debridement abscess Left 11/03/2015    Procedure: IRRIGATION AND DEBRIDEMENT LEFT HAND ABSCESS;  Surgeon: Roseanne Kaufman, MD;  Location: [facility];  Service: Orthopedics;  Laterality: Left;   Family History  Problem Relation Age of Onset  . Heart failure Mother   . Congestive Heart Failure Mother    Social History  Substance Use Topics  . Smoking status: Current Every Day Smoker -- 0.50 packs/day    Types: Cigarettes  . Smokeless tobacco: None  . Alcohol Use: No     Comment: socially    Review of Systems  Constitutional: Positive for fever. Negative for chills and diaphoresis.  Respiratory: Negative for shortness of breath.   Cardiovascular: Negative for chest pain.  Gastrointestinal: Positive for nausea and vomiting. Negative for abdominal pain.  Skin: Positive for wound.  Neurological: Negative for dizziness, syncope and headaches.  All other systems reviewed and are negative.     Allergies  Penicillins; Nsaids; Ivp dye; Ketorolac tromethamine; Morphine and related; and Tramadol  Home Medications   Prior to Admission medications   Medication Sig Start Date End Date Taking? Authorizing Provider  acetaminophen (TYLENOL) 500 MG tablet Take 1,000 mg by mouth every 6 (six) hours as needed for moderate pain or headache.   Yes Historical Provider, MD  ciprofloxacin (CIPRO) 500 MG tablet Take 1 tablet (500 mg total) by mouth 2 (two) times  daily. 11/07/15  Yes Roseanne Kaufman, MD  ondansetron (ZOFRAN-ODT) 4 MG disintegrating tablet Take 4 mg by mouth every 8 (eight) hours as needed for nausea or vomiting.   Yes Historical Provider, MD  pantoprazole (PROTONIX) 40 MG tablet Take 40 mg by mouth daily.   Yes Historical Provider, MD  promethazine (PHENERGAN) 25 MG suppository Place 25 mg rectally every 6 (six) hours as needed for nausea or vomiting.   Yes Historical Provider, MD  oxycodone (OXY-IR) 5 MG capsule Take 2 capsules (10 mg total) by mouth every  4 (four) hours as needed. Patient not taking: Reported on 11/16/2015 11/07/15   Roseanne Kaufman, MD   BP 133/76 mmHg  Pulse 88  Temp(Src) 97.8 F (36.6 C) (Oral)  Resp 18  Ht 5\' 9"  (1.753 m)  Wt 67.132 kg  BMI 21.85 kg/m2  SpO2 100% Physical Exam  Constitutional: He appears well-developed and well-nourished. No distress.  HENT:  Head: Normocephalic and atraumatic.  Eyes: Conjunctivae are normal. Right eye exhibits no discharge. Left eye exhibits no discharge. No scleral icterus.  Neck: Normal range of motion.  Cardiovascular: Regular rhythm and intact distal pulses.  Tachycardia present.   Radial pulse palpable. Cap refill less than 3 seconds.  Pulmonary/Chest: Effort normal. No respiratory distress.  Abdominal: Soft. There is no tenderness.  Musculoskeletal: Normal range of motion.  5 cm gaping wound to the dorsum of the left hand. Erythema of the skin surrounding the site. Hand appears mildly swollen. Purulent drainage noted from the wound. Dorsum of the left hand is tender to palpation. Full range of motion of the digits and wrist intact.  Neurological: He is alert. Coordination normal.  Skin: Skin is warm and dry.  Psychiatric: He has a normal mood and affect. His behavior is normal.  Nursing note and vitals reviewed.   ED Course  Procedures (including critical care time) Labs Review Labs Reviewed  SURGICAL PCR SCREEN - Abnormal; Notable for the following:    Staphylococcus aureus POSITIVE (*)    All other components within normal limits  COMPREHENSIVE METABOLIC PANEL - Abnormal; Notable for the following:    Glucose, Bld 114 (*)    All other components within normal limits  CBC - Abnormal; Notable for the following:    Hemoglobin 11.8 (*)    HCT 37.1 (*)    All other components within normal limits  LIPASE, BLOOD  URINALYSIS, ROUTINE W REFLEX MICROSCOPIC (NOT AT [facility])  I-STAT CG4 LACTIC ACID, ED  I-STAT CG4 LACTIC ACID, ED    Imaging Review No results found. I  have personally reviewed and evaluated these images and lab results as part of my medical decision-making.   EKG Interpretation None      MDM   Final diagnoses:  Wound infection ([place])   24 year old male presenting with concerns for wound infection of the left hand. Pt had surgical I&D performed by Dr. Amedeo Plenty on 12/13. Sutures were removed 2 days ago. Since then, wound has been gaping with increased purulent drainage. Associated symptoms include fever, nausea and vomiting. Pt is tachycardic and afebrile in ED. Nontoxic appearing. Wound to dorsum of left hand is gaping, exquisitely tender and purulent. WBC 7.8. Lactate 1.59. Consulted physician on call for Dr. Vanetta Shawl office. Dr. Rolena Infante recommends admission and Dr. Amedeo Plenty will take pt to OR tomorrow. Pt is PCN allergic so cannot have recommended unasyn per Dr. Rolena Infante. Consulted pharmacy who recommends imipenem. Pt admission orders placed and will be NPO after midnight.  Josephina Gip, PA-C 11/17/15 1136  Harvel Quale, MD 11/20/15 [phone]

## 2015-11-16 NOTE — ED Notes (Addendum)
Pt reports having an abscess on his left hand which made him septic on 12/16, pt had hand surgery at that time. Pt returns d/t his surgical incision dehisced on 12/24 and presents today w/ n/v and fever (home temp reported of 103.5). Pt took 1gm tylenol approx 11:30

## 2015-11-16 NOTE — ED Notes (Signed)
Ortho at bedside.

## 2015-11-16 NOTE — Progress Notes (Signed)
ANTIBIOTIC CONSULT NOTE - INITIAL  Pharmacy Consult for imipenem Indication: wound infection  Allergies  Allergen Reactions  . Nsaids Anaphylaxis and Other (See Comments)    Ulcers   . Penicillins Anaphylaxis    Has patient had a PCN reaction causing immediate rash, facial/tongue/throat swelling, SOB or lightheadedness with hypotension: Yes Has patient had a PCN reaction causing severe rash involving mucus membranes or skin necrosis: No Has patient had a PCN reaction that required hospitalization No Has patient had a PCN reaction occurring within the last 10 years: No If all of the above answers are "NO", then may proceed with Cephalosporin use.   Marland Kitchen Penicillins Anaphylaxis    Has patient had a PCN reaction causing immediate rash, facial/tongue/throat swelling, SOB or lightheadedness with hypotension: Unknown Has patient had a PCN reaction causing severe rash involving mucus membranes or skin necrosis: Unknown Has patient had a PCN reaction that required hospitalization: already in hospital when found out   Has patient had a PCN reaction occurring within the last 10 years: No  If all of the above answers are "NO", then may proceed with Cephalosporin use.   Clementeen Hoof [Iodinated Diagnostic Agents] Other (See Comments)    Needs solumedrol and benadryl prior  . Ketorolac Tromethamine Hives, Itching and Other (See Comments)    STOMACH ULCER  . Morphine And Related Hives and Itching  . Nsaids Other (See Comments)    ulcers  . Tramadol Hives, Itching and Other (See Comments)    Ulcer   . Morphine And Related Hives and Itching    Vital Signs: Temp: 99.8 F (37.7 C) (12/26 1723) Temp Source: Oral (12/26 1723) BP: 141/97 mmHg (12/26 1723) Pulse Rate: 122 (12/26 1730) Intake/Output from previous day:   Intake/Output from this shift:    Labs:  Recent Labs  11/16/15 1705  WBC 7.8  HGB 11.8*  PLT 368  CREATININE 0.94   Estimated Creatinine Clearance: 120.1 mL/min (by C-G  formula based on Cr of 0.94). No results for input(s): VANCOTROUGH, VANCOPEAK, VANCORANDOM, GENTTROUGH, GENTPEAK, GENTRANDOM, TOBRATROUGH, TOBRAPEAK, TOBRARND, AMIKACINPEAK, AMIKACINTROU, AMIKACIN in the last 72 hours.   Microbiology: Recent Results (from the past 720 hour(s))  Anaerobic culture     Status: None   Collection Time: 11/03/15  7:34 PM  Result Value Ref Range Status   Specimen Description ABSCESS LEFT HAND  Final   Special Requests NONE  Final   Gram Stain   Final    ABUNDANT WBC PRESENT, PREDOMINANTLY PMN NO SQUAMOUS EPITHELIAL CELLS SEEN NO ORGANISMS SEEN Performed at [facility]    Culture   Final    NO ANAEROBES ISOLATED Performed at [facility]    Report Status 11/08/2015 FINAL  Final  Culture, routine-abscess     Status: None   Collection Time: 11/03/15  7:34 PM  Result Value Ref Range Status   Specimen Description ABSCESS LEFT HAND  Final   Special Requests NONE  Final   Gram Stain   Final    ABUNDANT WBC PRESENT, PREDOMINANTLY PMN NO SQUAMOUS EPITHELIAL CELLS SEEN NO ORGANISMS SEEN Performed at [facility]    Culture   Final    FEW MICROAEROPHILIC STREPTOCOCCI Note: Standardized susceptibility testing for this organism is not available. Performed at [facility]    Report Status 11/07/2015 FINAL  Final    Medical History: Past Medical History  Diagnosis Date  . MRSA (methicillin resistant Staphylococcus aureus)   . Peritonitis ([place])   . Gastric ulcer   .  Stomach ulcer   . Crohn's   . Bowel obstruction ([place])   . Ulcer     Assessment: 24 yo male with left hand abscess with recent I&D on 12/13 and here with pain to the wound site (noted on cipro PTA). Pharmacy has been consulted to dose imipenem (hx PCN allergy) for a wound infection. -WBC= 7.8, SCr= 0.94, CrCl > 100  12/26 imipenem>>  Plan:  -Imipenem 500mg  IV q6h -Will follow renal function, cultures and clinical progress  Hildred Laser, Pharm D  11/16/2015 7:29 PM

## 2015-11-17 ENCOUNTER — Inpatient Hospital Stay (HOSPITAL_COMMUNITY): Payer: Self-pay | Admitting: Certified Registered Nurse Anesthetist

## 2015-11-17 ENCOUNTER — Encounter (HOSPITAL_COMMUNITY)
Admission: EM | Discharge status: Against Medical Advice | Payer: Self-pay | Source: Home / Self Care | Attending: Orthopedic Surgery

## 2015-11-17 HISTORY — PX: I & D EXTREMITY: SHX5045

## 2015-11-17 SURGERY — IRRIGATION AND DEBRIDEMENT EXTREMITY
Anesthesia: General | Site: Hand | Laterality: Left

## 2015-11-17 MED ORDER — LACTATED RINGERS IV SOLN
INTRAVENOUS | Status: DC | PRN
  Administered 2015-11-17: 18:00:00 via INTRAVENOUS

## 2015-11-17 MED ORDER — PROPOFOL 10 MG/ML IV BOLUS
INTRAVENOUS | Status: DC | PRN
  Administered 2015-11-17: 200 mg via INTRAVENOUS

## 2015-11-17 MED ORDER — DIPHENHYDRAMINE HCL 50 MG/ML IJ SOLN
INTRAMUSCULAR | Status: AC
  Filled 2015-11-17: qty 1

## 2015-11-17 MED ORDER — ONDANSETRON HCL 4 MG/2ML IJ SOLN: 4.0000 mg | Freq: Four times a day (QID) | INTRAMUSCULAR | Status: DC | PRN

## 2015-11-17 MED ORDER — OXYCODONE HCL 5 MG/5ML PO SOLN: 5.0000 mg | Freq: Once | ORAL | Status: AC | PRN

## 2015-11-17 MED ORDER — MUPIROCIN 2 % EX OINT
1.0000 "application " | TOPICAL_OINTMENT | Freq: Two times a day (BID) | CUTANEOUS | Status: DC
  Administered 2015-11-17 – 2015-11-19 (×5): 1 via NASAL
  Filled 2015-11-17: qty 22

## 2015-11-17 MED ORDER — OXYCODONE HCL 5 MG PO TABS
5.0000 mg | ORAL_TABLET | Freq: Once | ORAL | Status: AC | PRN
  Administered 2015-11-17: 5 mg via ORAL

## 2015-11-17 MED ORDER — METHOCARBAMOL 1000 MG/10ML IJ SOLN
500.0000 mg | Freq: Four times a day (QID) | INTRAVENOUS | Status: DC | PRN
  Filled 2015-11-17: qty 5

## 2015-11-17 MED ORDER — CHLORHEXIDINE GLUCONATE CLOTH 2 % EX PADS
6.0000 | MEDICATED_PAD | Freq: Every day | CUTANEOUS | Status: DC
  Administered 2015-11-17 – 2015-11-19 (×3): 6 via TOPICAL

## 2015-11-17 MED ORDER — PROPOFOL 10 MG/ML IV BOLUS
INTRAVENOUS | Status: AC
  Filled 2015-11-17: qty 40

## 2015-11-17 MED ORDER — ONDANSETRON HCL 4 MG/2ML IJ SOLN
INTRAMUSCULAR | Status: AC
  Filled 2015-11-17: qty 2

## 2015-11-17 MED ORDER — ONDANSETRON HCL 4 MG PO TABS: 4.0000 mg | ORAL_TABLET | Freq: Four times a day (QID) | ORAL | Status: DC | PRN

## 2015-11-17 MED ORDER — ONDANSETRON HCL 4 MG/2ML IJ SOLN
INTRAMUSCULAR | Status: DC | PRN
  Administered 2015-11-17: 4 mg via INTRAVENOUS

## 2015-11-17 MED ORDER — POLYETHYLENE GLYCOL 3350 17 G PO PACK: 17.0000 g | PACK | Freq: Every day | ORAL | Status: DC | PRN

## 2015-11-17 MED ORDER — DIPHENHYDRAMINE HCL 50 MG/ML IJ SOLN
INTRAMUSCULAR | Status: DC | PRN
  Administered 2015-11-17: 12.5 mg via INTRAVENOUS

## 2015-11-17 MED ORDER — FENTANYL CITRATE (PF) 100 MCG/2ML IJ SOLN
INTRAMUSCULAR | Status: AC
  Filled 2015-11-17: qty 2

## 2015-11-17 MED ORDER — LIDOCAINE HCL (CARDIAC) 20 MG/ML IV SOLN
INTRAVENOUS | Status: DC | PRN
  Administered 2015-11-17: 60 mg via INTRAVENOUS

## 2015-11-17 MED ORDER — FAMOTIDINE 20 MG PO TABS: 20.0000 mg | ORAL_TABLET | Freq: Two times a day (BID) | ORAL | Status: DC | PRN

## 2015-11-17 MED ORDER — OXYCODONE HCL 5 MG PO TABS
ORAL_TABLET | ORAL | Status: AC
  Filled 2015-11-17: qty 1

## 2015-11-17 MED ORDER — VITAMIN C 500 MG PO TABS
1000.0000 mg | ORAL_TABLET | Freq: Every day | ORAL | Status: DC
  Administered 2015-11-18 – 2015-11-19 (×2): 1000 mg via ORAL
  Filled 2015-11-17 (×2): qty 2

## 2015-11-17 MED ORDER — METHOCARBAMOL 500 MG PO TABS
500.0000 mg | ORAL_TABLET | Freq: Four times a day (QID) | ORAL | Status: DC | PRN
  Administered 2015-11-17 – 2015-11-19 (×4): 500 mg via ORAL
  Filled 2015-11-17 (×5): qty 1

## 2015-11-17 MED ORDER — LIDOCAINE HCL (CARDIAC) 20 MG/ML IV SOLN
INTRAVENOUS | Status: AC
  Filled 2015-11-17: qty 5

## 2015-11-17 MED ORDER — POTASSIUM CHLORIDE IN NACL 20-0.45 MEQ/L-% IV SOLN
INTRAVENOUS | Status: DC
  Administered 2015-11-17 – 2015-11-20 (×3): via INTRAVENOUS
  Filled 2015-11-17 (×6): qty 1000

## 2015-11-17 MED ORDER — FENTANYL CITRATE (PF) 250 MCG/5ML IJ SOLN
INTRAMUSCULAR | Status: AC
  Filled 2015-11-17: qty 5

## 2015-11-17 MED ORDER — SENNA 8.6 MG PO TABS
1.0000 | ORAL_TABLET | Freq: Two times a day (BID) | ORAL | Status: DC
  Administered 2015-11-17 – 2015-11-19 (×4): 8.6 mg via ORAL
  Filled 2015-11-17 (×5): qty 1

## 2015-11-17 MED ORDER — MAGNESIUM CITRATE PO SOLN
1.0000 | Freq: Once | ORAL | Status: DC | PRN
Start: 2015-11-17 — End: 2015-11-20

## 2015-11-17 MED ORDER — PROMETHAZINE HCL 25 MG RE SUPP: 12.5000 mg | Freq: Four times a day (QID) | RECTAL | Status: DC | PRN

## 2015-11-17 MED ORDER — SODIUM CHLORIDE 0.9 % IR SOLN
Status: DC | PRN
  Administered 2015-11-17: 1000 mL
  Administered 2015-11-17: 3000 mL

## 2015-11-17 MED ORDER — DOCUSATE SODIUM 100 MG PO CAPS: 100.0000 mg | ORAL_CAPSULE | Freq: Two times a day (BID) | ORAL | Status: DC

## 2015-11-17 MED ORDER — MIDAZOLAM HCL 2 MG/2ML IJ SOLN
INTRAMUSCULAR | Status: AC
  Filled 2015-11-17: qty 4

## 2015-11-17 MED ORDER — MIDAZOLAM HCL 2 MG/2ML IJ SOLN
INTRAMUSCULAR | Status: AC
  Filled 2015-11-17: qty 2

## 2015-11-17 MED ORDER — BISACODYL 10 MG RE SUPP: 10.0000 mg | Freq: Every day | RECTAL | Status: DC | PRN

## 2015-11-17 MED ORDER — DIPHENHYDRAMINE HCL 25 MG PO CAPS: 25.0000 mg | ORAL_CAPSULE | Freq: Four times a day (QID) | ORAL | Status: DC | PRN

## 2015-11-17 MED ORDER — MORPHINE SULFATE (PF) 2 MG/ML IV SOLN: 2.0000 mg | INTRAVENOUS | Status: DC | PRN

## 2015-11-17 MED ORDER — OXYCODONE HCL 5 MG PO TABS
5.0000 mg | ORAL_TABLET | ORAL | Status: DC | PRN
  Administered 2015-11-18 – 2015-11-20 (×11): 10 mg via ORAL
  Filled 2015-11-17 (×12): qty 2

## 2015-11-17 MED ORDER — MIDAZOLAM HCL 5 MG/5ML IJ SOLN
INTRAMUSCULAR | Status: DC | PRN
  Administered 2015-11-17: 2 mg via INTRAVENOUS
  Administered 2015-11-17 (×2): 1 mg via INTRAVENOUS

## 2015-11-17 MED ORDER — FENTANYL CITRATE (PF) 100 MCG/2ML IJ SOLN
25.0000 ug | INTRAMUSCULAR | Status: DC | PRN
  Administered 2015-11-17 (×2): 50 ug via INTRAVENOUS

## 2015-11-17 MED ORDER — LACTATED RINGERS IV SOLN
INTRAVENOUS | Status: DC
  Administered 2015-11-17: 17:00:00 via INTRAVENOUS

## 2015-11-17 MED ORDER — FENTANYL CITRATE (PF) 100 MCG/2ML IJ SOLN
INTRAMUSCULAR | Status: DC | PRN
  Administered 2015-11-17 (×4): 50 ug via INTRAVENOUS

## 2015-11-17 SURGICAL SUPPLY — 38 items
BANDAGE ELASTIC 4 VELCRO ST LF (GAUZE/BANDAGES/DRESSINGS) ×2 IMPLANT
BNDG CONFORM 2 STRL LF (GAUZE/BANDAGES/DRESSINGS) IMPLANT
BNDG GAUZE ELAST 4 BULKY (GAUZE/BANDAGES/DRESSINGS) ×2 IMPLANT
CORDS BIPOLAR (ELECTRODE) ×2 IMPLANT
CUFF TOURNIQUET SINGLE 18IN (TOURNIQUET CUFF) ×2 IMPLANT
CUFF TOURNIQUET SINGLE 24IN (TOURNIQUET CUFF) IMPLANT
DRSG ADAPTIC 3X8 NADH LF (GAUZE/BANDAGES/DRESSINGS) ×2 IMPLANT
GAUZE SPONGE 4X4 12PLY STRL (GAUZE/BANDAGES/DRESSINGS) ×2 IMPLANT
GAUZE SPONGE 4X4 16PLY XRAY LF (GAUZE/BANDAGES/DRESSINGS) ×2 IMPLANT
GAUZE XEROFORM 1X8 LF (GAUZE/BANDAGES/DRESSINGS) ×2 IMPLANT
GLOVE BIOGEL M STRL SZ7.5 (GLOVE) ×2 IMPLANT
GLOVE SS BIOGEL STRL SZ 8 (GLOVE) ×1 IMPLANT
GLOVE SUPERSENSE BIOGEL SZ 8 (GLOVE) ×1
GOWN STRL REUS W/ TWL LRG LVL3 (GOWN DISPOSABLE) ×1 IMPLANT
GOWN STRL REUS W/ TWL XL LVL3 (GOWN DISPOSABLE) ×2 IMPLANT
GOWN STRL REUS W/TWL LRG LVL3 (GOWN DISPOSABLE) ×1
GOWN STRL REUS W/TWL XL LVL3 (GOWN DISPOSABLE) ×2
HANDPIECE INTERPULSE COAX TIP (DISPOSABLE)
KIT BASIN OR (CUSTOM PROCEDURE TRAY) ×2 IMPLANT
KIT ROOM TURNOVER OR (KITS) ×2 IMPLANT
MANIFOLD NEPTUNE II (INSTRUMENTS) ×2 IMPLANT
NEEDLE HYPO 25GX1X1/2 BEV (NEEDLE) IMPLANT
NS IRRIG 1000ML POUR BTL (IV SOLUTION) ×2 IMPLANT
PACK ORTHO EXTREMITY (CUSTOM PROCEDURE TRAY) ×2 IMPLANT
PAD ARMBOARD 7.5X6 YLW CONV (MISCELLANEOUS) ×2 IMPLANT
PAD CAST 4YDX4 CTTN HI CHSV (CAST SUPPLIES) ×1 IMPLANT
PADDING CAST COTTON 4X4 STRL (CAST SUPPLIES) ×1
SET CYSTO W/LG BORE CLAMP LF (SET/KITS/TRAYS/PACK) ×2 IMPLANT
SET HNDPC FAN SPRY TIP SCT (DISPOSABLE) IMPLANT
SPLINT FIBERGLASS 3X12 (CAST SUPPLIES) ×2 IMPLANT
SPONGE LAP 4X18 X RAY DECT (DISPOSABLE) ×2 IMPLANT
SYR CONTROL 10ML LL (SYRINGE) IMPLANT
TOWEL OR 17X24 6PK STRL BLUE (TOWEL DISPOSABLE) ×2 IMPLANT
TOWEL OR 17X26 10 PK STRL BLUE (TOWEL DISPOSABLE) ×2 IMPLANT
TUBE ANAEROBIC SPECIMEN COL (MISCELLANEOUS) IMPLANT
TUBE CONNECTING 12X1/4 (SUCTIONS) ×2 IMPLANT
WATER STERILE IRR 1000ML POUR (IV SOLUTION) ×2 IMPLANT
YANKAUER SUCT BULB TIP NO VENT (SUCTIONS) ×2 IMPLANT

## 2015-11-17 NOTE — Progress Notes (Signed)
Patient continues to take gauze off infected hand and wash it it water- he also continues to irritate site by touching and rubbing. RN educated patient to leave gauze alone and has rewrapped with new material at this time.

## 2015-11-17 NOTE — Progress Notes (Signed)
Caught patient and girlfriend smoking in the room. Patient stated that it was only a vape and that it wasn't serious. RN spoke with patient and girlfriend about policy. Girlfriend took vape to the car and both promised to behave. Charge nurse notified. Will continue to monitor

## 2015-11-17 NOTE — H&P (Signed)
  Patient was seen and examined at bedside today. I performed a dressing change to his hand and recommended that we perform a formal debridement. I have now seeing him a second time day and once again he is removed his bandages appears and he has a very worn tattered-looking covering over the wound.  The patient states that he does not excessively manipulate the area but certainly I have some great concerns today.  I discussed with the patient the findings.  At present time I recommended irrigation and debridement.  This patient has tremendous challenges in terms of his IV drug abuse and pain issues.  Given the poor quality of his wound I would recommend a repeat irrigation and debridement. He understands this. We'll proceed accordingly.  All questions have been encouraged and answered.  I should note his right and cubital fossa does look good. His left hand is in poor repair present time and thus I feel that we certainly need to move forward with a more aggressive approach. Fortunately his tendon architecture has good excursion.  All questions have been addressed. We will plan for left hand irrigation and debridement.  Kaiven Vester M.D.

## 2015-11-17 NOTE — Anesthesia Postprocedure Evaluation (Signed)
Anesthesia Post Note  Patient: Travis Callahan  Procedure(s) Performed: Procedure(s) (LRB): IRRIGATION AND DEBRIDEMENT LEFT HAND (Left)  Patient location during evaluation: PACU Anesthesia Type: General Level of consciousness: awake and alert Pain management: pain level controlled Vital Signs Assessment: post-procedure vital signs reviewed and stable Respiratory status: spontaneous breathing, nonlabored ventilation, respiratory function stable and patient connected to nasal cannula oxygen Cardiovascular status: blood pressure returned to baseline and stable Postop Assessment: no signs of nausea or vomiting Anesthetic complications: no    Last Vitals:  Filed Vitals:   11/17/15 1933 11/17/15 1945  BP: 123/69 132/84  Pulse: 74 74  Temp: 36.6 C   Resp: 9 11    Last Pain:  Filed Vitals:   11/17/15 1949  PainSc: Asleep                 Indira Sorenson S

## 2015-11-17 NOTE — Progress Notes (Signed)
Received call that pt had removed peripheral IV, upon entering the room to assess pt stated that the IV had come out but that he had "threaded it back in". Left room to go get supplies to remove IV, upon reentering and stating that I would need to remove IV because of infection risk pt stated "well it didn't actually come out, the tape was coming off, I noticed it and called". Upon  assessment IV catheter appeared to be in the vein and IVFs were infusing without any signs of infiltration. Consulted IV team nurse who instructed to reinforce site and put in an IV team consult for further evaluation. From report this morning this is pt's 7th IV since being admitted yesterday. Secured with Tegaderm and will continue to monitor.

## 2015-11-17 NOTE — Transfer of Care (Signed)
Immediate Anesthesia Transfer of Care Note  Patient: Travis Callahan  Procedure(s) Performed: Procedure(s): IRRIGATION AND DEBRIDEMENT LEFT HAND (Left)  Patient Location: PACU  Anesthesia Type:General  Level of Consciousness: sedated  Airway & Oxygen Therapy: Patient Spontanous Breathing and Patient connected to face mask oxygen  Post-op Assessment: Report given to RN and Post -op Vital signs reviewed and stable  Post vital signs: Reviewed and stable  Last Vitals:  Filed Vitals:   11/17/15 1243 11/17/15 1635  BP: 115/70 127/84  Pulse: 72 72  Temp: 36.4 C 36.5 C  Resp: 18 18    Complications: No apparent anesthesia complications

## 2015-11-17 NOTE — Op Note (Signed)
See [id number] Amedeo Plenty MD

## 2015-11-17 NOTE — Anesthesia Procedure Notes (Signed)
Procedure Name: LMA Insertion Date/Time: 11/17/2015 6:43 PM Performed by: Trixie Deis A Pre-anesthesia Checklist: Patient identified, Timeout performed, Emergency Drugs available, Suction available and Patient being monitored Patient Re-evaluated:Patient Re-evaluated prior to inductionOxygen Delivery Method: Circle system utilized Preoxygenation: Pre-oxygenation with 100% oxygen Intubation Type: IV induction Ventilation: Mask ventilation without difficulty LMA: LMA inserted LMA Size: 5.0 Number of attempts: 1 Placement Confirmation: positive ETCO2 and breath sounds checked- equal and bilateral Tube secured with: Tape Dental Injury: Teeth and Oropharynx as per pre-operative assessment

## 2015-11-17 NOTE — Anesthesia Preprocedure Evaluation (Addendum)
Anesthesia Evaluation  Patient identified by MRN, date of birth, ID band Patient awake    Reviewed: Allergy & Precautions, NPO status , Patient's Chart, lab work & pertinent test results  Airway Mallampati: II   Neck ROM: full    Dental   Pulmonary Current Smoker,    breath sounds clear to auscultation       Cardiovascular negative cardio ROS   Rhythm:regular Rate:Normal     Neuro/Psych    GI/Hepatic PUD, GERD  ,  Endo/Other    Renal/GU      Musculoskeletal   Abdominal   Peds  Hematology   Anesthesia Other Findings   Reproductive/Obstetrics                            Anesthesia Physical Anesthesia Plan  ASA: II  Anesthesia Plan: General   Post-op Pain Management:    Induction: Intravenous  Airway Management Planned: LMA  Additional Equipment:   Intra-op Plan:   Post-operative Plan:   Informed Consent: I have reviewed the patients History and Physical, chart, labs and discussed the procedure including the risks, benefits and alternatives for the proposed anesthesia with the patient or authorized representative who has indicated his/her understanding and acceptance.     Plan Discussed with: CRNA, Anesthesiologist and Surgeon  Anesthesia Plan Comments:         Anesthesia Quick Evaluation

## 2015-11-17 NOTE — Op Note (Signed)
NAMECAIO, KILMER NO.:  [id number]  MEDICAL RECORD NO.:  [id number]  LOCATION:  [facility]                         FACILITY:  [facility]  PHYSICIAN:  Satira Anis. Makari Portman, M.D.DATE OF BIRTH:  04-27-1991  DATE OF PROCEDURE: DATE OF DISCHARGE:                              OPERATIVE REPORT   PREOPERATIVE DIAGNOSIS:  Left hand abscess, secondary to IV drug abuse, with continued infectious process.  POSTOPERATIVE DIAGNOSIS:  Left hand abscess, secondary to IV drug abuse, with continued infectious process.  PROCEDURE: 1. Radical tenosynovectomy, left hand, extensor apparatus. 2. I and D abscess, left hand.  SURGEON:  Satira Anis. Amedeo Plenty, M.D.  ASSISTANT:  None.  COMPLICATIONS:  None.  ANESTHESIA:  General.  TOURNIQUET TIME:  Less than 30 minutes.  INDICATIONS:  The patient is a 24 year old male, who presents significant challenges secondary to drug use issues.  He previously underwent a right arm I and D, this is closing in well.  This was noted in the chart.  He presented with a left hand abscess 10-14 days ago, underwent irrigation and debridement.  Since that time, he has been managed very closely in our office, but has had quite a few difficulties.  His difficulties include hitting the hand and other trauma.  He shows up with the dressing always in disarray and continues to pick it, his dressing unfortunately.  I have discussed with the patient to not manipulate the wound.  Despite all of this, an initial I and D as the chart reflects, he has a continued infectious process with tenosynovitis.  I have recommended inpatient care, I and D, and closure once conditions allow.  OPERATION IN DETAIL:  The patient was seen by myself and Anesthesia, taken to the operative theater, underwent smooth induction of general anesthesia, he was prepped and draped in usual sterile fashion with Betadine scrub and paint.  Following this, the wound was examined.  The patient had  significant exposure of the tendon architecture.  He had an inflammatory peel about the tendons and thus it was quite apparent, we would have to perform the I and D of an abscess.  This was done by undermining the tendon and the soft tissues and irrigating the area. Following this, I performed a radical tenosynovectomy of the EIP, EDC to the index, EDC to the middle finger, and EDC to the ring finger.  Tendon architecture was kept preserved and a tenolysis, tenosynovectomy was accomplished.  The skin was adequately mobilized to close, but given this patient's first __________ course, I chose to leave this open and come back another day to make sure wound conditions are pristine for closure.  Following this, the patient underwent placement of 3 L of saline in the wound for irrigant and the wound was then packed with a wet-to-dry dressing, Adaptic, Xeroform, and a splint.  I have asked him to not manipulate his wound and to not remove his dressing.  I will return him to the operative theater on Thursday.  Should any problems occur, he will notify me.  These notes have been discussed and all questions been addressed.     Satira Anis. Amedeo Plenty, M.D.     [facility]  D:  11/17/2015  T:  11/17/2015  Job:  [id number]

## 2015-11-18 ENCOUNTER — Encounter (HOSPITAL_COMMUNITY): Payer: Self-pay | Admitting: Orthopedic Surgery

## 2015-11-18 NOTE — Progress Notes (Signed)
Patient ID: Travis Callahan, male   DOB: 1991-05-06, 24 y.o.   MRN: [id number] The patient is awake alert and oriented.  Vital signs stable.  I discussed with him at length the operative findings and our plans going forward.  He is tolerating his diet well.   The patient is alert and oriented in no acute distress. The patient complains of pain in the affected upper extremity.  The patient is noted to have a normal HEENT exam. Lung fields show equal chest expansion and no shortness of breath. Abdomen exam is nontender without distention. Lower extremity examination does not show any fracture dislocation or blood clot symptoms. Pelvis is stable and the neck and back are stable and nontender.  We have discussed with the patient the issues regarding their infection to the extremity. We will continue antibiotics and await culture results We have discussed with the patient the need for daily irrigation and debridement as well as therapy to the area. We have discussed with the patient the necessity of range of motion to the involved joints as discussed today. We have discussed with the patient the unpredictability of infections at times. We'll continue to work towards good pain control and restoration of function. The patient understands the need for meticulous wound care and the necessity of proper followup.  The possible complications of stiffness (loss of motion), resistant infection, possible deep bone infection, possible chronic pain issues, possible need for multiple surgeries and even amputation.  With this in mind the patient understands our goal is to eradicate the infection to quiesence. We will continue to work towards these goals. Will plan to proceed to the OR tomorrow for repeat irrigation and debridement and possible wound closure depending on the interoperative conditions.  We are planning surgery for your upper extremity. The risk and benefits of surgery to include risk of  bleeding, infection, anesthesia,  damage to normal structures and failure of the surgery to accomplish its intended goals of relieving symptoms and restoring function have been discussed in detail. With this in mind we plan to proceed. I have specifically discussed with the patient the pre-and postoperative regime and the dos and don'ts and risk and benefits in great detail. Risk and benefits of surgery also include risk of dystrophy(CRPS), chronic nerve pain, failure of the healing process to go onto completion and other inherent risks of surgery The relavent the pathophysiology of the disease/injury process, as well as the alternatives for treatment and postoperative course of action has been discussed in great detail with the patient who desires to proceed.  We will do everything in our power to help you (the patient) restore function to the upper extremity. It is a pleasure to see this patient today.  Arianie Couse MD

## 2015-11-19 ENCOUNTER — Inpatient Hospital Stay (HOSPITAL_COMMUNITY): Payer: Self-pay | Admitting: Anesthesiology

## 2015-11-19 ENCOUNTER — Encounter (HOSPITAL_COMMUNITY)
Admission: EM | Discharge status: Against Medical Advice | Payer: Self-pay | Source: Home / Self Care | Attending: Orthopedic Surgery

## 2015-11-19 HISTORY — PX: I & D EXTREMITY: SHX5045

## 2015-11-19 SURGERY — IRRIGATION AND DEBRIDEMENT EXTREMITY
Anesthesia: General | Laterality: Left

## 2015-11-19 MED ORDER — FENTANYL CITRATE (PF) 250 MCG/5ML IJ SOLN
INTRAMUSCULAR | Status: AC
  Filled 2015-11-19: qty 5

## 2015-11-19 MED ORDER — FENTANYL CITRATE (PF) 100 MCG/2ML IJ SOLN
100.0000 ug | Freq: Once | INTRAMUSCULAR | Status: AC
  Administered 2015-11-19: 100 ug via INTRAVENOUS

## 2015-11-19 MED ORDER — FENTANYL CITRATE (PF) 100 MCG/2ML IJ SOLN
INTRAMUSCULAR | Status: AC
  Administered 2015-11-19: 50 ug via INTRAVENOUS
  Filled 2015-11-19: qty 2

## 2015-11-19 MED ORDER — FENTANYL CITRATE (PF) 100 MCG/2ML IJ SOLN
INTRAMUSCULAR | Status: AC
Start: 2015-11-19 — End: 2015-11-20
  Filled 2015-11-19: qty 2

## 2015-11-19 MED ORDER — SODIUM CHLORIDE 0.9 % IR SOLN
Status: DC | PRN
  Administered 2015-11-19: 1000 mL

## 2015-11-19 MED ORDER — FENTANYL CITRATE (PF) 250 MCG/5ML IJ SOLN
INTRAMUSCULAR | Status: DC | PRN
  Administered 2015-11-19 (×2): 100 ug via INTRAVENOUS
  Administered 2015-11-19: 50 ug via INTRAVENOUS

## 2015-11-19 MED ORDER — BUPIVACAINE HCL (PF) 0.25 % IJ SOLN
INTRAMUSCULAR | Status: AC
  Filled 2015-11-19: qty 30

## 2015-11-19 MED ORDER — MIDAZOLAM HCL 2 MG/2ML IJ SOLN
INTRAMUSCULAR | Status: AC
  Filled 2015-11-19: qty 2

## 2015-11-19 MED ORDER — LACTATED RINGERS IV SOLN
INTRAVENOUS | Status: DC | PRN
  Administered 2015-11-19: 17:00:00 via INTRAVENOUS

## 2015-11-19 MED ORDER — LIDOCAINE HCL (CARDIAC) 20 MG/ML IV SOLN
INTRAVENOUS | Status: DC | PRN
  Administered 2015-11-19: 50 mg via INTRATRACHEAL

## 2015-11-19 MED ORDER — ONDANSETRON HCL 4 MG/2ML IJ SOLN: 4.0000 mg | Freq: Once | INTRAMUSCULAR | Status: DC | PRN

## 2015-11-19 MED ORDER — 0.9 % SODIUM CHLORIDE (POUR BTL) OPTIME
TOPICAL | Status: DC | PRN
  Administered 2015-11-19: 1000 mL

## 2015-11-19 MED ORDER — FENTANYL CITRATE (PF) 100 MCG/2ML IJ SOLN
INTRAMUSCULAR | Status: AC
  Administered 2015-11-19: 100 ug via INTRAVENOUS
  Filled 2015-11-19: qty 2

## 2015-11-19 MED ORDER — FENTANYL CITRATE (PF) 100 MCG/2ML IJ SOLN
25.0000 ug | INTRAMUSCULAR | Status: DC | PRN
  Administered 2015-11-19 (×2): 50 ug via INTRAVENOUS

## 2015-11-19 MED ORDER — PROPOFOL 10 MG/ML IV BOLUS
INTRAVENOUS | Status: AC
  Filled 2015-11-19: qty 20

## 2015-11-19 MED ORDER — MIDAZOLAM HCL 5 MG/5ML IJ SOLN
INTRAMUSCULAR | Status: DC | PRN
  Administered 2015-11-19: 2 mg via INTRAVENOUS

## 2015-11-19 MED ORDER — FENTANYL CITRATE (PF) 100 MCG/2ML IJ SOLN: 25.0000 ug | Freq: Once | INTRAMUSCULAR | Status: DC

## 2015-11-19 MED ORDER — PROPOFOL 10 MG/ML IV BOLUS
INTRAVENOUS | Status: DC | PRN
  Administered 2015-11-19: 200 mg via INTRAVENOUS

## 2015-11-19 MED ORDER — ONDANSETRON HCL 4 MG/2ML IJ SOLN
4.0000 mg | Freq: Once | INTRAMUSCULAR | Status: AC
  Administered 2015-11-19: 4 mg via INTRAVENOUS
  Filled 2015-11-19: qty 2

## 2015-11-19 MED ORDER — DIPHENHYDRAMINE HCL 50 MG/ML IJ SOLN
INTRAMUSCULAR | Status: DC | PRN
  Administered 2015-11-19: 12.5 mg via INTRAVENOUS

## 2015-11-19 SURGICAL SUPPLY — 46 items
BANDAGE ELASTIC 4 VELCRO ST LF (GAUZE/BANDAGES/DRESSINGS) ×2 IMPLANT
BNDG CONFORM 2 STRL LF (GAUZE/BANDAGES/DRESSINGS) IMPLANT
BNDG GAUZE ELAST 4 BULKY (GAUZE/BANDAGES/DRESSINGS) ×2 IMPLANT
CORDS BIPOLAR (ELECTRODE) ×2 IMPLANT
CUFF TOURNIQUET SINGLE 18IN (TOURNIQUET CUFF) ×2 IMPLANT
CUFF TOURNIQUET SINGLE 24IN (TOURNIQUET CUFF) IMPLANT
DRSG ADAPTIC 3X8 NADH LF (GAUZE/BANDAGES/DRESSINGS) ×2 IMPLANT
GAUZE SPONGE 4X4 12PLY STRL (GAUZE/BANDAGES/DRESSINGS) ×2 IMPLANT
GAUZE XEROFORM 1X8 LF (GAUZE/BANDAGES/DRESSINGS) ×2 IMPLANT
GAUZE XEROFORM 5X9 LF (GAUZE/BANDAGES/DRESSINGS) ×2 IMPLANT
GLOVE BIOGEL M STRL SZ7.5 (GLOVE) ×2 IMPLANT
GLOVE SS BIOGEL STRL SZ 8 (GLOVE) ×1 IMPLANT
GLOVE SUPERSENSE BIOGEL SZ 8 (GLOVE) ×1
GOWN STRL REUS W/ TWL LRG LVL3 (GOWN DISPOSABLE) ×1 IMPLANT
GOWN STRL REUS W/ TWL XL LVL3 (GOWN DISPOSABLE) ×2 IMPLANT
GOWN STRL REUS W/TWL LRG LVL3 (GOWN DISPOSABLE) ×1
GOWN STRL REUS W/TWL XL LVL3 (GOWN DISPOSABLE) ×2
HANDPIECE INTERPULSE COAX TIP (DISPOSABLE)
IV SOD CHL 0.9% 1000ML (IV SOLUTION) ×6 IMPLANT
KIT BASIN OR (CUSTOM PROCEDURE TRAY) ×2 IMPLANT
KIT ROOM TURNOVER OR (KITS) ×2 IMPLANT
MANIFOLD NEPTUNE II (INSTRUMENTS) ×2 IMPLANT
NEEDLE HYPO 25GX1X1/2 BEV (NEEDLE) IMPLANT
NS IRRIG 1000ML POUR BTL (IV SOLUTION) ×2 IMPLANT
PACK ORTHO EXTREMITY (CUSTOM PROCEDURE TRAY) ×2 IMPLANT
PAD ARMBOARD 7.5X6 YLW CONV (MISCELLANEOUS) ×2 IMPLANT
PAD CAST 3X4 CTTN HI CHSV (CAST SUPPLIES) ×1 IMPLANT
PAD CAST 4YDX4 CTTN HI CHSV (CAST SUPPLIES) ×1 IMPLANT
PADDING CAST ABS 3INX4YD NS (CAST SUPPLIES) ×1
PADDING CAST ABS 4INX4YD NS (CAST SUPPLIES) ×1
PADDING CAST ABS COTTON 3X4 (CAST SUPPLIES) ×1 IMPLANT
PADDING CAST ABS COTTON 4X4 ST (CAST SUPPLIES) ×1 IMPLANT
PADDING CAST COTTON 3X4 STRL (CAST SUPPLIES) ×1
PADDING CAST COTTON 4X4 STRL (CAST SUPPLIES) ×1
SET CYSTO W/LG BORE CLAMP LF (SET/KITS/TRAYS/PACK) ×2 IMPLANT
SET HNDPC FAN SPRY TIP SCT (DISPOSABLE) IMPLANT
SPLINT FIBERGLASS 3X35 (CAST SUPPLIES) ×2 IMPLANT
SPONGE LAP 4X18 X RAY DECT (DISPOSABLE) ×2 IMPLANT
SUT PROLENE 3 0 PS 2 (SUTURE) ×4 IMPLANT
SYR CONTROL 10ML LL (SYRINGE) IMPLANT
TOWEL OR 17X24 6PK STRL BLUE (TOWEL DISPOSABLE) ×2 IMPLANT
TOWEL OR 17X26 10 PK STRL BLUE (TOWEL DISPOSABLE) ×2 IMPLANT
TUBE ANAEROBIC SPECIMEN COL (MISCELLANEOUS) IMPLANT
TUBE CONNECTING 12X1/4 (SUCTIONS) ×2 IMPLANT
WATER STERILE IRR 1000ML POUR (IV SOLUTION) IMPLANT
YANKAUER SUCT BULB TIP NO VENT (SUCTIONS) ×2 IMPLANT

## 2015-11-19 NOTE — Anesthesia Preprocedure Evaluation (Addendum)
Anesthesia Evaluation  Patient identified by MRN, date of birth, ID band Patient awake    Reviewed: Allergy & Precautions, NPO status , Patient's Chart, lab work & pertinent test results  History of Anesthesia Complications Negative for: history of anesthetic complications  Airway Mallampati: II  TM Distance: >3 FB Neck ROM: Full    Dental no notable dental hx. (+) Dental Advisory Given   Pulmonary Current Smoker,    Pulmonary exam normal breath sounds clear to auscultation       Cardiovascular negative cardio ROS Normal cardiovascular exam Rhythm:Regular Rate:Normal     Neuro/Psych negative neurological ROS  negative psych ROS   GI/Hepatic GERD  Medicated and Controlled,(+)     substance abuse  IV drug use,   Endo/Other  negative endocrine ROS  Renal/GU negative Renal ROS  negative genitourinary   Musculoskeletal negative musculoskeletal ROS (+)   Abdominal   Peds negative pediatric ROS (+)  Hematology negative hematology ROS (+)   Anesthesia Other Findings   Reproductive/Obstetrics negative OB ROS                            Anesthesia Physical Anesthesia Plan  ASA: II  Anesthesia Plan: General   Post-op Pain Management:    Induction: Intravenous  Airway Management Planned: LMA  Additional Equipment:   Intra-op Plan:   Post-operative Plan: Extubation in OR  Informed Consent: I have reviewed the patients History and Physical, chart, labs and discussed the procedure including the risks, benefits and alternatives for the proposed anesthesia with the patient or authorized representative who has indicated his/her understanding and acceptance.   Dental advisory given  Plan Discussed with: CRNA, Anesthesiologist and Surgeon  Anesthesia Plan Comments:        Anesthesia Quick Evaluation

## 2015-11-19 NOTE — Progress Notes (Signed)
Pharmacy Antibiotic Follow-up Note  Travis Callahan is a 24 y.o. year-old male admitted on 11/16/2015.  The patient is currently on day hand abscess.   Temp (24hrs), Avg:98.4 F (36.9 C), Min:98.2 F (36.8 C), Max:98.5 F (36.9 C)   Recent Labs Lab 11/13/15 0158 11/16/15 1705  WBC 7.1 7.8    Recent Labs Lab 11/13/15 0158 11/16/15 1705  CREATININE 1.31* 0.94   Estimated Creatinine Clearance: 114 mL/min (by C-G formula based on Cr of 0.94).    Allergies  Allergen Reactions  . Penicillins Anaphylaxis    Has patient had a PCN reaction causing immediate rash, facial/tongue/throat swelling, SOB or lightheadedness with hypotension: Yes Has patient had a PCN reaction causing severe rash involving mucus membranes or skin necrosis: No Has patient had a PCN reaction that required hospitalization No Has patient had a PCN reaction occurring within the last 10 years: No If all of the above answers are "NO", then may proceed with Cephalosporin use. Childhood reaction - told by mom  . Nsaids Other (See Comments)    Ulcers   . Ivp Dye [Iodinated Diagnostic Agents] Other (See Comments)    Needs solumedrol and benadryl prior  . Ketorolac Tromethamine Hives, Itching and Other (See Comments)    STOMACH ULCER  . Morphine And Related Hives and Itching  . Tramadol Nausea And Vomiting    Ulcer     Infectious Disease: Day #4 of abx for left hand abscess with recent I&D on 12/13. Here with pain to the wound site (noted on cipro PTA). Pharmacy has been consulted to dose imipenem (hx PCN allergy) for a wound infection. S/p I&D 12/27. Afebrile, WBC wnl. Plan surgery. No labs since 12/26.  Microbiology results: 12/13 Abscess cx > few microaerophilic strep  [id number] Surgical PCR positive for MSSA  12/26 imipenem >>  Plan:  Continue imipenem 500mg  IV q6h   Tayven Renteria S. Alford Highland, PharmD, BCPS Clinical Staff Pharmacist Pager [phone]  Wayland Salinas PharmD 11/19/2015 11:54  AM

## 2015-11-19 NOTE — Progress Notes (Signed)
Called to give report to short stay

## 2015-11-19 NOTE — H&P (Signed)
  In preop holding  Vital signs stable  He is afebrile.  Patient understands plans for today.  He is doing fairly well he notes a new complaints.  We'll plan for I and D possible closure the wound today based on interoperative conditions.  All questions have been addressed.  Aveyah Greenwood M.D.

## 2015-11-19 NOTE — Op Note (Signed)
See dict# [id number] Baley Lorimer MD

## 2015-11-19 NOTE — Anesthesia Procedure Notes (Signed)
Procedure Name: LMA Insertion Date/Time: 11/19/2015 5:52 PM Performed by: Maude Leriche D Pre-anesthesia Checklist: Patient identified, Emergency Drugs available, Patient being monitored, Suction available and Timeout performed Patient Re-evaluated:Patient Re-evaluated prior to inductionOxygen Delivery Method: Circle system utilized Preoxygenation: Pre-oxygenation with 100% oxygen Intubation Type: IV induction Ventilation: Mask ventilation without difficulty LMA: LMA inserted LMA Size: 4.0 Number of attempts: 1 Placement Confirmation: positive ETCO2 and breath sounds checked- equal and bilateral Tube secured with: Tape Dental Injury: Teeth and Oropharynx as per pre-operative assessment

## 2015-11-19 NOTE — Anesthesia Postprocedure Evaluation (Signed)
Anesthesia Post Note  Patient: Travis Callahan  Procedure(s) Performed: Procedure(s) (LRB): IRRIGATION AND DEBRIDEMENT EXTREMITY (Left)  Patient location during evaluation: PACU Anesthesia Type: General Level of consciousness: sedated and patient cooperative Pain management: pain level controlled Vital Signs Assessment: post-procedure vital signs reviewed and stable Respiratory status: spontaneous breathing Cardiovascular status: stable Anesthetic complications: no    Last Vitals:  Filed Vitals:   11/19/15 1941 11/19/15 2015  BP: 143/78 159/94  Pulse: 74 71  Temp:  37 C  Resp: 17 16    Last Pain:  Filed Vitals:   11/19/15 2028  PainSc: [place]

## 2015-11-19 NOTE — Transfer of Care (Signed)
Immediate Anesthesia Transfer of Care Note  Patient: Travis Callahan  Procedure(s) Performed: Procedure(s): IRRIGATION AND DEBRIDEMENT EXTREMITY (Left)  Patient Location: PACU  Anesthesia Type:General  Level of Consciousness: awake  Airway & Oxygen Therapy: Patient Spontanous Breathing  Post-op Assessment: Report given to RN and Post -op Vital signs reviewed and stable  Post vital signs: Reviewed and stable  Last Vitals:  Filed Vitals:   11/19/15 1300 11/19/15 1827  BP: 137/73   Pulse: 72   Temp: 36.9 C 36.2 C  Resp: 16     Complications: No apparent anesthesia complications

## 2015-11-20 ENCOUNTER — Encounter (HOSPITAL_COMMUNITY): Payer: Self-pay | Admitting: Orthopedic Surgery

## 2015-11-20 MED ORDER — DOXYCYCLINE HYCLATE 100 MG PO TABS: 100.0000 mg | ORAL_TABLET | Freq: Two times a day (BID) | ORAL | Status: DC

## 2015-11-20 MED ORDER — HYDROMORPHONE HCL 2 MG PO TABS: 2.0000 mg | ORAL_TABLET | ORAL | Status: DC | PRN

## 2015-11-20 NOTE — Progress Notes (Signed)
IV infliltrated needed to be d/c pt refused to put new IV in states he is leaving he wants medications called Dr Amedeo Plenty regarding this matter we ordered PO medications and these were not good enough for the patient.  I told him not to vapor in the hospital it was not allowed and he did it anyway, pt swearing and dressed walked out had him sign AMA paperwork tried to explain the importance of staying and refused to listen states he is going down to ED for medications anyway told case manager she will call to let them know what is going on

## 2015-11-20 NOTE — Op Note (Signed)
NAME:  Travis Callahan, Travis Callahan NO.:  MEDICAL RECORD NO.:  [id number]  LOCATION:                                 FACILITY:  PHYSICIAN:  Satira Anis. Gevork Ayyad, M.D.DATE OF BIRTH:  [birth year]  DATE OF PROCEDURE: DATE OF DISCHARGE:                              OPERATIVE REPORT   PREOPERATIVE DIAGNOSIS:  Left hand tenosynovitis and history of irrigation and debridement of an abscess secondary to IV drug abuse.  POSTOPERATIVE DIAGNOSIS:  Left hand tenosynovitis and history of irrigation and debridement of an abscess secondary to IV drug abuse.  PROCEDURE: 1. Radical tenosynovectomy, left hand. 2. Irrigation and debridement, left hand. 3. Skin, subcutaneous tissue, tendon, and muscle, excisional in nature     with knife, curette, and scissor. 4. Primary closure, left hand, complex in nature, 7 cm.  SURGEON:  Satira Anis. Amedeo Plenty, M.D.  ASSISTANT:  None.  COMPLICATION:  None.  ANESTHESIA:  General.  TOURNIQUET TIME:  Less than 10 minutes.  INDICATIONS FOR THE PROCEDURE:  The patient is a 24 year old male, presents today with a missed diagnosis.  He is a very challenging patient, but has looked very well today in terms of his hand.  He underwent I and D 48 hours ago roughly.  He understands the plan of care.  OPERATION IN DETAIL:  The patient was seen by myself and Anesthesia, taken to the operative theater, and underwent smooth induction of general anesthesia, informed of the general anesthetic used.  He was prepped draped in usual sterile fashion.  I gave him a pre-scrub, followed by a 10-minute surgical Betadine scrub, followed by sterile field application.  Time-out was observed arm, then underwent I and D of skin, subcutaneous tissue, muscle, tendon, excisional nature with knife, blade, curette, and scissor.  Following this 3 L were placed in the wound and an extensive radical tenosynovectomy of the EDC to the index, middle, ring, and small fingers was  accomplished.  At this time, we then changed house and performed a primary closure of the 7 cm roughly wound, far near far as well as horizontal vertical mattress sutures were employed to allow for direct primary coverage.  I did freshen the skin edges.  He tolerated the procedure well.  There was no reaccumulation of infection.  Hopefully, he will go on to heal this without great difficulty. I have once again counseled him in regard to substance abuse issues leaving the clean healthy life, etc.  Unfortunately, he has been quite a challenge to me of actually treated his right arm as well.  Nevertheless we are going to try to employ all we can to help this young man.  These notes have been discussed.  All questions have been addressed.     Satira Anis. Amedeo Plenty, M.D.     [facility]  D:  11/19/2015  T:  11/19/2015  Job:  [id number]

## 2015-11-25 NOTE — Addendum Note (Signed)
Addendum  created 11/25/15 2125 by Finis Bud, MD   Modules edited: Anesthesia Responsible Staff

## 2015-11-27 ENCOUNTER — Emergency Department (HOSPITAL_COMMUNITY)
Admission: EM | Admit: 2015-11-27 | Discharge: 2015-11-27 | Discharge status: Against Medical Advice | Payer: Self-pay | Attending: Emergency Medicine | Admitting: Emergency Medicine

## 2015-11-27 ENCOUNTER — Emergency Department (HOSPITAL_COMMUNITY): Payer: Self-pay

## 2015-11-27 ENCOUNTER — Encounter (HOSPITAL_COMMUNITY): Payer: Self-pay | Admitting: Emergency Medicine

## 2015-11-27 ENCOUNTER — Inpatient Hospital Stay (HOSPITAL_COMMUNITY)
Admission: EM | Admit: 2015-11-27 | Discharge: 2015-12-05 | DRG: 857 | Disposition: A | Discharge status: Home / Self Care | Payer: Self-pay | Attending: Orthopedic Surgery | Admitting: Orthopedic Surgery

## 2015-11-27 DIAGNOSIS — F17210 Nicotine dependence, cigarettes, uncomplicated: Secondary | ICD-10-CM | POA: Insufficient documentation

## 2015-11-27 DIAGNOSIS — T148XXA Other injury of unspecified body region, initial encounter: Secondary | ICD-10-CM

## 2015-11-27 DIAGNOSIS — D649 Anemia, unspecified: Secondary | ICD-10-CM | POA: Diagnosis present

## 2015-11-27 DIAGNOSIS — F1910 Other psychoactive substance abuse, uncomplicated: Secondary | ICD-10-CM | POA: Diagnosis present

## 2015-11-27 DIAGNOSIS — M65142 Other infective (teno)synovitis, left hand: Secondary | ICD-10-CM | POA: Diagnosis present

## 2015-11-27 DIAGNOSIS — T814XXA Infection following a procedure, initial encounter: Principal | ICD-10-CM | POA: Diagnosis present

## 2015-11-27 DIAGNOSIS — Y838 Other surgical procedures as the cause of abnormal reaction of the patient, or of later complication, without mention of misadventure at the time of the procedure: Secondary | ICD-10-CM | POA: Diagnosis present

## 2015-11-27 DIAGNOSIS — Z880 Allergy status to penicillin: Secondary | ICD-10-CM

## 2015-11-27 DIAGNOSIS — Z888 Allergy status to other drugs, medicaments and biological substances status: Secondary | ICD-10-CM

## 2015-11-27 DIAGNOSIS — Z886 Allergy status to analgesic agent status: Secondary | ICD-10-CM

## 2015-11-27 DIAGNOSIS — Z79899 Other long term (current) drug therapy: Secondary | ICD-10-CM

## 2015-11-27 DIAGNOSIS — B9562 Methicillin resistant Staphylococcus aureus infection as the cause of diseases classified elsewhere: Secondary | ICD-10-CM | POA: Diagnosis present

## 2015-11-27 DIAGNOSIS — Z9049 Acquired absence of other specified parts of digestive tract: Secondary | ICD-10-CM

## 2015-11-27 DIAGNOSIS — Z8719 Personal history of other diseases of the digestive system: Secondary | ICD-10-CM

## 2015-11-27 DIAGNOSIS — L089 Local infection of the skin and subcutaneous tissue, unspecified: Secondary | ICD-10-CM

## 2015-11-27 DIAGNOSIS — M79643 Pain in unspecified hand: Secondary | ICD-10-CM | POA: Insufficient documentation

## 2015-11-27 DIAGNOSIS — L02512 Cutaneous abscess of left hand: Secondary | ICD-10-CM | POA: Diagnosis present

## 2015-11-27 DIAGNOSIS — B182 Chronic viral hepatitis C: Secondary | ICD-10-CM | POA: Diagnosis present

## 2015-11-27 DIAGNOSIS — Z885 Allergy status to narcotic agent status: Secondary | ICD-10-CM

## 2015-11-27 DIAGNOSIS — Z9119 Patient's noncompliance with other medical treatment and regimen: Secondary | ICD-10-CM

## 2015-11-27 DIAGNOSIS — L0291 Cutaneous abscess, unspecified: Secondary | ICD-10-CM

## 2015-11-27 LAB — CBC WITH DIFFERENTIAL/PLATELET
BASOS ABS: 0 10*3/uL (ref 0.0–0.1)
Basophils Relative: 0 %
Eosinophils Absolute: 0.1 10*3/uL (ref 0.0–0.7)
Eosinophils Relative: 1 %
HEMATOCRIT: 34.4 % — AB (ref 39.0–52.0)
Hemoglobin: 11 g/dL — ABNORMAL LOW (ref 13.0–17.0)
LYMPHS PCT: 29 %
Lymphs Abs: 2.1 10*3/uL (ref 0.7–4.0)
MCH: 27.9 pg (ref 26.0–34.0)
MCHC: 32 g/dL (ref 30.0–36.0)
MCV: 87.3 fL (ref 78.0–100.0)
MONO ABS: 0.7 10*3/uL (ref 0.1–1.0)
MONOS PCT: 10 %
NEUTROS ABS: 4.2 10*3/uL (ref 1.7–7.7)
Neutrophils Relative %: 60 %
Platelets: 375 10*3/uL (ref 150–400)
RBC: 3.94 MIL/uL — ABNORMAL LOW (ref 4.22–5.81)
RDW: 14.4 % (ref 11.5–15.5)
WBC: 7 10*3/uL (ref 4.0–10.5)

## 2015-11-27 LAB — I-STAT CG4 LACTIC ACID, ED: LACTIC ACID, VENOUS: 1.43 mmol/L (ref 0.5–2.0)

## 2015-11-27 LAB — COMPREHENSIVE METABOLIC PANEL
ALK PHOS: 75 U/L (ref 38–126)
ALT: 20 U/L (ref 17–63)
AST: 21 U/L (ref 15–41)
Albumin: 4.3 g/dL (ref 3.5–5.0)
Anion gap: 10 (ref 5–15)
BILIRUBIN TOTAL: 0.8 mg/dL (ref 0.3–1.2)
BUN: 10 mg/dL (ref 6–20)
CALCIUM: 9.3 mg/dL (ref 8.9–10.3)
CO2: 25 mmol/L (ref 22–32)
CREATININE: 0.77 mg/dL (ref 0.61–1.24)
Chloride: 110 mmol/L (ref 101–111)
Glucose, Bld: 89 mg/dL (ref 65–99)
Potassium: 3.3 mmol/L — ABNORMAL LOW (ref 3.5–5.1)
Sodium: 145 mmol/L (ref 135–145)
TOTAL PROTEIN: 8.2 g/dL — AB (ref 6.5–8.1)

## 2015-11-27 MED ORDER — FENTANYL CITRATE (PF) 100 MCG/2ML IJ SOLN
50.0000 ug | Freq: Once | INTRAMUSCULAR | Status: AC
  Administered 2015-11-27: 50 ug via INTRAVENOUS
  Filled 2015-11-27: qty 2

## 2015-11-27 MED ORDER — ONDANSETRON 4 MG PO TBDP
4.0000 mg | ORAL_TABLET | Freq: Once | ORAL | Status: AC
  Administered 2015-11-27: 4 mg via ORAL
  Filled 2015-11-27: qty 1

## 2015-11-27 MED ORDER — HYDROMORPHONE HCL 1 MG/ML IJ SOLN
1.0000 mg | INTRAMUSCULAR | Status: DC | PRN
  Administered 2015-11-27 – 2015-11-28 (×5): 1 mg via INTRAVENOUS
  Filled 2015-11-27 (×5): qty 1

## 2015-11-27 MED ORDER — ONDANSETRON HCL 4 MG/2ML IJ SOLN
4.0000 mg | Freq: Once | INTRAMUSCULAR | Status: AC
  Administered 2015-11-27: 4 mg via INTRAVENOUS
  Filled 2015-11-27: qty 2

## 2015-11-27 MED ORDER — OXYCODONE-ACETAMINOPHEN 5-325 MG PO TABS
1.0000 | ORAL_TABLET | Freq: Once | ORAL | Status: AC
  Administered 2015-11-27: 1 via ORAL
  Filled 2015-11-27: qty 1

## 2015-11-27 MED ORDER — CEFAZOLIN SODIUM 1-5 GM-% IV SOLN
1.0000 g | Freq: Three times a day (TID) | INTRAVENOUS | Status: DC
  Administered 2015-11-27: 1 g via INTRAVENOUS
  Filled 2015-11-27 (×2): qty 50

## 2015-11-27 MED ORDER — POTASSIUM CHLORIDE CRYS ER 20 MEQ PO TBCR
40.0000 meq | EXTENDED_RELEASE_TABLET | Freq: Once | ORAL | Status: AC
  Administered 2015-11-27: 40 meq via ORAL
  Filled 2015-11-27: qty 2

## 2015-11-27 NOTE — ED Notes (Signed)
Pt reports left AMA post left hand surgery on Monday; reports worsening pain to site and fever. Last tylenol dose 1100 today; 1 gram.

## 2015-11-27 NOTE — ED Notes (Signed)
Pt requesting pain meds before any more lab draws

## 2015-11-27 NOTE — ED Notes (Signed)
PA at bedside; states hand surgeon will be at bedside shortly to talk to pt.

## 2015-11-27 NOTE — ED Notes (Signed)
Hand surgeon at bedside. 

## 2015-11-27 NOTE — H&P (Signed)
Travis Callahan is an 25 y.o. male.   Chief Complaint: Left hand pain HPI: This is a 25 year old very challenging male who presents with pain in his left hand. I notice patient quite well. I have performed irrigation and debridement once in November to the right antecubital fossa due to IV drug abuse. I also performed surgical intervention in December numerous times due to IV drug abuse. He is been noncompliant, manipulative, and has been very challenging. He presents today as he is having more difficulty. This series of events is due to the fact that he has had great difficulty with excepting responsibility for his predicament. He left the hospital approximately a week ago [place]. He was upset and left the hospital and I have not heard from since.  He states that his nursing staff was difficult to deal with. He tried to talk with the charge nurse per was unavailable to come to terms with this. He lab rated in great detail his complaints.  I've had a good relationship with the patient we had a very big heart-to-heart tonight about his predicament.  He is trying to stop drug use. He has an open wound about his left hand. My partner Dr. Rolena Infante got a call tonight in regards to his predicament. Dr. Rolena Infante does not perform hand surgery and does I am seeing him tonight. I'm not on call but have assumed his care once again.  I have discussed with the patient that it is very unfortunate that he elected to remove his cast and manipulated his wound about the left hand once again.  He now finds himself in a horrible predicament where he has an exposed wound with exposed tendon architecture about the dorsal aspect of his left hand. This is quite challenging for any hand surgeon of specimen the face of infection and substance abuse.  I have reviewed this with he and his friends.  We've had a very deep and meaningful conversation regards to his challenges ahead. He would like to try and  resolve the infectious manner in his hand. He states that he will try everything in his power to abide by my instructions once again.  I reviewed all issues with him in detail.  He does not have a high white count he does have an open wound. The prior surgical intervention is healed 30% proximally. The distal and has devitalized tissue.      Past Medical History  Diagnosis Date  . MRSA (methicillin resistant Staphylococcus aureus)   . Peritonitis ([place])   . Gastric ulcer   . Stomach ulcer   . Crohn's   . Bowel obstruction ([place])   . Ulcer     Past Surgical History  Procedure Laterality Date  . Gastric ulcer perforation      repair   . Incision and drainage of wound Right 10/11/2015    Procedure: IRRIGATION AND DEBRIDEMENT right upper arm;  Surgeon: Roseanne Kaufman, MD;  Location: [facility];  Service: Orthopedics;  Laterality: Right;  . Stomach surgery    . Appendectomy    . Abdominal surgery    . I&d extremity Right 11/03/2015    Procedure: IRRIGATION AND DEBRIDEMENT RIGHT ELBOW ;  Surgeon: Roseanne Kaufman, MD;  Location: [place];  Service: Orthopedics;  Laterality: Right;  . Irrigation and debridement abscess Left 11/03/2015    Procedure: IRRIGATION AND DEBRIDEMENT LEFT HAND ABSCESS;  Surgeon: Roseanne Kaufman, MD;  Location: [place];  Service: Orthopedics;  Laterality: Left;  . I&d extremity  Left 11/17/2015    Procedure: IRRIGATION AND DEBRIDEMENT LEFT HAND;  Surgeon: Roseanne Kaufman, MD;  Location: [place];  Service: Orthopedics;  Laterality: Left;  . I&d extremity Left 11/19/2015    Procedure: IRRIGATION AND DEBRIDEMENT EXTREMITY;  Surgeon: Roseanne Kaufman, MD;  Location: [place];  Service: Orthopedics;  Laterality: Left;    Family History  Problem Relation Age of Onset  . Heart failure Mother   . Congestive Heart Failure Mother    Social History:  reports that he has been smoking Cigarettes.  He has been smoking about 0.50 packs per day. He does not have any smokeless tobacco history  on file. He reports that he uses illicit drugs (Marijuana). He reports that he does not drink alcohol.  Allergies:  Allergies  Allergen Reactions  . Penicillins Anaphylaxis    Has patient had a PCN reaction causing immediate rash, facial/tongue/throat swelling, SOB or lightheadedness with hypotension: Yes Has patient had a PCN reaction causing severe rash involving mucus membranes or skin necrosis: No Has patient had a PCN reaction that required hospitalization No Has patient had a PCN reaction occurring within the last 10 years: No If all of the above answers are "NO", then may proceed with Cephalosporin use. Childhood reaction - told by mom  . Nsaids Other (See Comments)    Ulcers   . Ivp Dye [Iodinated Diagnostic Agents] Other (See Comments)    Needs solumedrol and benadryl prior  . Ketorolac Tromethamine Hives, Itching and Other (See Comments)    STOMACH ULCER  . Morphine And Related Hives and Itching  . Tramadol Nausea And Vomiting    Ulcer      (Not in a hospital admission)  Results for orders placed or performed during the hospital encounter of 11/27/15 (from the past 48 hour(s))  Comprehensive metabolic panel     Status: Abnormal   Collection Time: 11/27/15  4:09 PM  Result Value Ref Range   Sodium 145 135 - 145 mmol/L   Potassium 3.3 (L) 3.5 - 5.1 mmol/L   Chloride 110 101 - 111 mmol/L   CO2 25 22 - 32 mmol/L   Glucose, Bld 89 65 - 99 mg/dL   BUN 10 6 - 20 mg/dL   Creatinine, Ser 0.77 0.61 - 1.24 mg/dL   Calcium 9.3 8.9 - 10.3 mg/dL   Total Protein 8.2 (H) 6.5 - 8.1 g/dL   Albumin 4.3 3.5 - 5.0 g/dL   AST 21 15 - 41 U/L   ALT 20 17 - 63 U/L   Alkaline Phosphatase 75 38 - 126 U/L   Total Bilirubin 0.8 0.3 - 1.2 mg/dL   GFR calc non Af Amer >60 >60 mL/min   GFR calc Af Amer >60 >60 mL/min    Comment: (NOTE) The eGFR has been calculated using the CKD EPI equation. This calculation has not been validated in all clinical situations. eGFR's persistently <60 mL/min  signify possible Chronic Kidney Disease.    Anion gap 10 5 - 15  CBC with Differential     Status: Abnormal   Collection Time: 11/27/15  4:09 PM  Result Value Ref Range   WBC 7.0 4.0 - 10.5 K/uL   RBC 3.94 (L) 4.22 - 5.81 MIL/uL   Hemoglobin 11.0 (L) 13.0 - 17.0 g/dL   HCT 34.4 (L) 39.0 - 52.0 %   MCV 87.3 78.0 - 100.0 fL   MCH 27.9 26.0 - 34.0 pg   MCHC 32.0 30.0 - 36.0 g/dL   RDW 14.4 11.5 -  15.5 %   Platelets 375 150 - 400 K/uL   Neutrophils Relative % 60 %   Neutro Abs 4.2 1.7 - 7.7 K/uL   Lymphocytes Relative 29 %   Lymphs Abs 2.1 0.7 - 4.0 K/uL   Monocytes Relative 10 %   Monocytes Absolute 0.7 0.1 - 1.0 K/uL   Eosinophils Relative 1 %   Eosinophils Absolute 0.1 0.0 - 0.7 K/uL   Basophils Relative 0 %   Basophils Absolute 0.0 0.0 - 0.1 K/uL  I-Stat CG4 Lactic Acid, ED  (not at [facility])     Status: None   Collection Time: 11/27/15  4:16 PM  Result Value Ref Range   Lactic Acid, Venous 1.43 0.5 - 2.0 mmol/L   Dg Hand Complete Left  11/27/2015  CLINICAL DATA:  LEFT hand surgery 5 days prior. Open wound on the dorsal aspect of the hand. EXAM: LEFT HAND - COMPLETE 3+ VIEW COMPARISON:  None. FINDINGS: Soft tissue swelling of the dorsum of the hand at the level of the distal metacarpals. No foreign body. No osseous abnormality. No subcutaneous gas. IMPRESSION: Soft tissue swelling of the dorsum of the hand. No osseous abnormality. Electronically Signed   By: Suzy Bouchard M.D.   On: 11/27/2015 18:10    Review of Systems  HENT: Negative.   Cardiovascular: Negative.   Gastrointestinal: Negative.        History of GI surgery  Genitourinary: Negative.   Neurological: Negative.   Endo/Heme/Allergies: Negative.   Psychiatric/Behavioral:       Substance abuse history    Blood pressure 171/105, pulse 96, temperature 98.6 F (37 C), temperature source Oral, resp. rate 17, height _0  (1.753 m), weight 72.576 kg (160 lb), SpO2 94 %. Physical Exam left hand: patient has an open  wound approximately 3 4 cm. There is the vitalized skin tissue and the chronic tissue. 30% of the proximal wound is healed. He has intact flexion and extension. There is no evidence of necrotizing fasciitis. There is no evidence of instability about the wrist or hand. There is no free air. He is sensate and does have refill.  His right elbow is much improved. This is undergone debridement's in the past. This is something that I would deem as nearly quiet sent and should not require any further debridement's. I discussed all issues with the patient at length. There are examination has been performed.  The patient is alert and oriented in no acute distress. The patient complains of pain in the affected upper extremity.  The patient is noted to have a normal HEENT exam. Lung fields show equal chest expansion and no shortness of breath. Abdomen exam is nontender without distention. Lower extremity examination does not show any fracture dislocation or blood clot symptoms. Pelvis is stable and the neck and back are stable and nontender. Assessment/Plan Procedure: I performed irrigation and debridement excisional nature with 10 lysis tenosynovectomy of the tendons. I cultured this for aerobic and anaerobic cultures. The patient tolerated this well this was an excisional debridement with curet scissor and knife blade. He tolerated the irrigation and debridement without difficulty. I did perform a limited 10 lysis. I'll perform a more formal debridement in the days ahead. I packed this with a wet-to-dry dressing.  I would mid the patient for IV antibiotics. I will give him limited pain medicine. We will plan surgical debridement.  My thoughts are one option is to see how the wound does with serial debridement and wet-to-dry's versus a vacuum-assisted  closure device.  He may ultimately lose the tendon function and the architecture about the dorsal aspect of his hand. He may ultimately need a skin graft. He  may ultimately simply do very poorly given all these issues and his noncompliance and very challenging issues.  This is very frustrating for any surgeon but certainly the noncompliance issue is the biggest road block.  I discussed with him  I'll return him to the operative theater for formal I and D in the next 48 hours.  He understands the admission policy. He understands the compliance issues.  We have certainly done all we can for this patient and more. We have bent over backwards in my opinion.  I have spent more than than 2 hours with patient on a Friday night when I'm not on call-face-to-face time was 2 hours. I did this is much is humanly possible in this patient's care in my opinion. Nevertheless this is going to be very challenging given no guarantee approach  Paulene Floor 11/27/2015, 9:29 PM

## 2015-11-27 NOTE — ED Notes (Signed)
Called multiple times with no answer.

## 2015-11-27 NOTE — ED Notes (Signed)
Travis Ricks PA states do not complete I-Stat Lactic Acid

## 2015-11-27 NOTE — ED Notes (Signed)
5N called at [facility]; secretary states this nurse should call back for report in 10 minutes

## 2015-11-27 NOTE — ED Notes (Signed)
Pt states "I really just want to go. I keep getting fentanyl and that doesn't work for me." This nurse explained risks of leaving AMA and risks of worsening of pt's condition. PA Elmyra Ricks made aware. Elmyra Ricks currently at bedside.

## 2015-11-27 NOTE — ED Notes (Signed)
Report given to Langley Gauss, Therapist, sports at [facility]

## 2015-11-27 NOTE — ED Provider Notes (Signed)
CSN: [id number]     Arrival date & time 11/27/15  1507 History   First MD Initiated Contact with Patient 11/27/15 1541     Chief Complaint  Patient presents with  . Post-op Problem  . Fever     (Consider location/radiation/quality/duration/timing/severity/associated sxs/prior Treatment) HPI   Patient is a 25 year old male past medical history of IV drug abuse and MRSA who presents to the ED with complaint of wound infection to left hand. Patient reports he had the left hand abscess I&D on 12/13. Patient has been seen in the ED multiple times for his hand wound and states he has had 5 I&D's performed by Dr. Amedeo Plenty. Patient notes during his last admission he left AMA prior to being discharged not been taking any antibiotics since his last I&D. He notes he took off his cast 5 days ago and states he is now having worsening swelling and drainage from his wound. He notes his sutures have opened due to the swelling and denies taking out his sutures himself at home. Endorses having yellow purulent drainage. Endorses associated fever, nausea, vomiting. Patient also notes he has been working over the past week and states he has been lifting things with his left arm which has worsened his pain. He endorses numbness to skin surrounding the wound. Patient states he took 1 g of Tylenol prior to coming in to the ED. Denies recent drug use. Denies chills, lightheadedness, dizziness, headache, weakness, chest pain, shortness of breath, abdominal pain, diarrhea.   Past Medical History  Diagnosis Date  . MRSA (methicillin resistant Staphylococcus aureus)   . Peritonitis ([place])   . Gastric ulcer   . Stomach ulcer   . Crohn's   . Bowel obstruction ([place])   . Ulcer    Past Surgical History  Procedure Laterality Date  . Gastric ulcer perforation      repair   . Incision and drainage of wound Right 10/11/2015    Procedure: IRRIGATION AND DEBRIDEMENT right upper arm;  Surgeon: Roseanne Kaufman, MD;  Location: [facility];  Service: Orthopedics;  Laterality: Right;  . Stomach surgery    . Appendectomy    . Abdominal surgery    . I&d extremity Right 11/03/2015    Procedure: IRRIGATION AND DEBRIDEMENT RIGHT ELBOW ;  Surgeon: Roseanne Kaufman, MD;  Location: [place];  Service: Orthopedics;  Laterality: Right;  . Irrigation and debridement abscess Left 11/03/2015    Procedure: IRRIGATION AND DEBRIDEMENT LEFT HAND ABSCESS;  Surgeon: Roseanne Kaufman, MD;  Location: [place];  Service: Orthopedics;  Laterality: Left;  . I&d extremity Left 11/17/2015    Procedure: IRRIGATION AND DEBRIDEMENT LEFT HAND;  Surgeon: Roseanne Kaufman, MD;  Location: [place];  Service: Orthopedics;  Laterality: Left;  . I&d extremity Left 11/19/2015    Procedure: IRRIGATION AND DEBRIDEMENT EXTREMITY;  Surgeon: Roseanne Kaufman, MD;  Location: [place];  Service: Orthopedics;  Laterality: Left;   Family History  Problem Relation Age of Onset  . Heart failure Mother   . Congestive Heart Failure Mother    Social History  Substance Use Topics  . Smoking status: Current Every Day Smoker -- 0.25 packs/day    Types: Cigarettes  . Smokeless tobacco: None  . Alcohol Use: No     Comment: socially    Review of Systems  Constitutional: Positive for fever.  Gastrointestinal: Positive for nausea and vomiting.  Musculoskeletal: Positive for joint swelling and arthralgias.  Skin: Positive for wound.  All other systems reviewed and are negative.  Allergies  Penicillins; Nsaids; Ivp dye; Ketorolac tromethamine; Morphine and related; and Tramadol  Home Medications   Prior to Admission medications   Medication Sig Start Date End Date Taking? Authorizing Provider  ciprofloxacin (CIPRO) 500 MG tablet Take 1 tablet (500 mg total) by mouth 2 (two) times daily. 11/07/15  Yes Roseanne Kaufman, MD  pantoprazole (PROTONIX) 40 MG tablet Take 40 mg by mouth daily.   Yes Historical Provider, MD  acetaminophen (TYLENOL) 500 MG tablet Take 1,000 mg by mouth every  6 (six) hours as needed for moderate pain or headache.    Historical Provider, MD  ondansetron (ZOFRAN-ODT) 4 MG disintegrating tablet Take 4 mg by mouth every 8 (eight) hours as needed for nausea or vomiting.    Historical Provider, MD  oxycodone (OXY-IR) 5 MG capsule Take 2 capsules (10 mg total) by mouth every 4 (four) hours as needed. Patient not taking: Reported on 11/16/2015 11/07/15   Roseanne Kaufman, MD  promethazine (PHENERGAN) 25 MG suppository Place 25 mg rectally every 6 (six) hours as needed for nausea or vomiting.    Historical Provider, MD   BP 151/86 mmHg  Pulse 95  Temp(Src) 98.9 F (37.2 C) (Oral)  Resp 18  Ht 5\' 9"  (1.753 m)  Wt 72.576 kg  BMI 23.62 kg/m2  SpO2 98% Physical Exam  Constitutional: He is oriented to person, place, and time. He appears well-developed and well-nourished.  HENT:  Head: Normocephalic and atraumatic.  Mouth/Throat: Oropharynx is clear and moist.  Eyes: Conjunctivae and EOM are normal. Right eye exhibits no discharge. Left eye exhibits no discharge. No scleral icterus.  Neck: Normal range of motion. Neck supple.  Cardiovascular: Regular rhythm, normal heart sounds and intact distal pulses.   Tachycardic, HR 118  Pulmonary/Chest: Effort normal and breath sounds normal. No respiratory distress. He has no wheezes. He has no rales. He exhibits no tenderness.  Abdominal: Soft. Bowel sounds are normal. He exhibits no distension.  Musculoskeletal:  5cm gaping wound to dorsum of left hand with surrounding erythema, warmth and swelling. Pururlent drainage noted in the wound. Pt reports extreme pain with light tenderness to left hand. Dec. Passive flexion ROM of left digits and wrist. I am only able to passively move left DIP joints due to reported pain. Pt reports numbness to skin surrounding wound.  Neurological: He is alert and oriented to person, place, and time.  Skin: Skin is warm and dry.  Nursing note and vitals reviewed.   ED Course   Procedures (including critical care time) Labs Review Labs Reviewed  COMPREHENSIVE METABOLIC PANEL - Abnormal; Notable for the following:    Potassium 3.3 (*)    Total Protein 8.2 (*)    All other components within normal limits  CBC WITH DIFFERENTIAL/PLATELET - Abnormal; Notable for the following:    RBC 3.94 (*)    Hemoglobin 11.0 (*)    HCT 34.4 (*)    All other components within normal limits  CULTURE, BLOOD (ROUTINE X 2)  CULTURE, BLOOD (ROUTINE X 2)  WOUND CULTURE  ANAEROBIC CULTURE  FUNGUS CULTURE W SMEAR  MRSA PCR SCREENING  I-STAT CG4 LACTIC ACID, ED    Imaging Review Dg Hand Complete Left  11/27/2015  CLINICAL DATA:  LEFT hand surgery 5 days prior. Open wound on the dorsal aspect of the hand. EXAM: LEFT HAND - COMPLETE 3+ VIEW COMPARISON:  None. FINDINGS: Soft tissue swelling of the dorsum of the hand at the level of the distal metacarpals. No foreign body. No  osseous abnormality. No subcutaneous gas. IMPRESSION: Soft tissue swelling of the dorsum of the hand. No osseous abnormality. Electronically Signed   By: Suzy Bouchard M.D.   On: 11/27/2015 18:10   I have personally reviewed and evaluated these images and lab results as part of my medical decision-making.  Filed Vitals:   11/27/15 2221 11/28/15 0005  BP: 150/91 151/86  Pulse: 94 95  Temp:  98.9 F (37.2 C)  Resp:  18     MDM   Final diagnoses:  Wound infection (HCC)    Pt presents with pain, swelling, redness and drainage to wound on dorsum of left hand. Pt has had multiple I&Ds performed by Dr. Amedeo Plenty to hand wound due to initial abscess. Pt left his last admission AMA and is currently not on any antibiotics. Endorses associated fever. Afebrile, HR 118, normotensive. Exam revealed gaping wound to dorsum of left hand with drainage and surrounding erythema, warmth and swelling, dec. Passive ROM of left digits, hand and wrist due to pain. Potassium 3.3, lactic acid 1.43, labs otherwise unremarkable. Pt  given PO potassium in the ED. Left hand xray showed soft tissue swelling with no osseous abnormality. Blood cultures obtained.   Consulted to hand surgery. Dr. Rolena Infante reports he will come evaluate the patient in the ED. Due to pt being seen initially by Dr. Amedeo Plenty, Dr. Rolena Infante advised Dr. Amedeo Plenty about pt in the ED. Dr. Amedeo Plenty evaluated pt in the ED and advised that he will perform I&D and advised to have the pt transferred to [facility]. Discussed results and plan for transfer with patient.   Chesley Noon [place], [place] 11/28/15 0036  Merrily Pew, MD 11/28/15 [phone]

## 2015-11-28 ENCOUNTER — Encounter (HOSPITAL_COMMUNITY)
Admission: EM | Disposition: A | Discharge status: Home / Self Care | Payer: Self-pay | Source: Home / Self Care | Attending: Orthopedic Surgery

## 2015-11-28 ENCOUNTER — Inpatient Hospital Stay (HOSPITAL_COMMUNITY): Payer: Self-pay | Admitting: Certified Registered Nurse Anesthetist

## 2015-11-28 ENCOUNTER — Encounter (HOSPITAL_COMMUNITY): Payer: Self-pay

## 2015-11-28 ENCOUNTER — Inpatient Hospital Stay (HOSPITAL_COMMUNITY): Payer: Self-pay

## 2015-11-28 HISTORY — PX: I & D EXTREMITY: SHX5045

## 2015-11-28 LAB — MRSA PCR SCREENING: MRSA BY PCR: NEGATIVE

## 2015-11-28 SURGERY — IRRIGATION AND DEBRIDEMENT EXTREMITY
Anesthesia: General | Laterality: Left

## 2015-11-28 MED ORDER — VANCOMYCIN HCL IN DEXTROSE 1-5 GM/200ML-% IV SOLN
1000.0000 mg | Freq: Three times a day (TID) | INTRAVENOUS | Status: DC
  Administered 2015-11-28 – 2015-11-30 (×5): 1000 mg via INTRAVENOUS
  Filled 2015-11-28 (×10): qty 200

## 2015-11-28 MED ORDER — VITAMIN C 500 MG PO TABS: 1000.0000 mg | ORAL_TABLET | Freq: Every day | ORAL | Status: DC

## 2015-11-28 MED ORDER — OXYCODONE HCL 5 MG PO TABS
5.0000 mg | ORAL_TABLET | ORAL | Status: DC | PRN
  Administered 2015-11-28 – 2015-12-05 (×42): 10 mg via ORAL
  Filled 2015-11-28 (×43): qty 2

## 2015-11-28 MED ORDER — ONDANSETRON HCL 4 MG/2ML IJ SOLN
4.0000 mg | Freq: Four times a day (QID) | INTRAMUSCULAR | Status: DC | PRN
Start: 2015-11-28 — End: 2015-12-03
  Administered 2015-11-30: 4 mg via INTRAVENOUS
  Filled 2015-11-28: qty 2

## 2015-11-28 MED ORDER — MAGNESIUM CITRATE PO SOLN: 1.0000 | Freq: Once | ORAL | Status: DC | PRN

## 2015-11-28 MED ORDER — ONDANSETRON HCL 4 MG PO TABS: 4.0000 mg | ORAL_TABLET | Freq: Four times a day (QID) | ORAL | Status: DC | PRN

## 2015-11-28 MED ORDER — FENTANYL CITRATE (PF) 250 MCG/5ML IJ SOLN
INTRAMUSCULAR | Status: AC
  Filled 2015-11-28: qty 5

## 2015-11-28 MED ORDER — POTASSIUM CHLORIDE IN NACL 20-0.45 MEQ/L-% IV SOLN
INTRAVENOUS | Status: DC
  Administered 2015-11-28 – 2015-12-02 (×6): via INTRAVENOUS
  Filled 2015-11-28 (×11): qty 1000

## 2015-11-28 MED ORDER — PROMETHAZINE HCL 25 MG RE SUPP: 12.5000 mg | Freq: Four times a day (QID) | RECTAL | Status: DC | PRN

## 2015-11-28 MED ORDER — CEFAZOLIN SODIUM-DEXTROSE 2-3 GM-% IV SOLR
2.0000 g | Freq: Three times a day (TID) | INTRAVENOUS | Status: DC
  Administered 2015-11-28 – 2015-11-30 (×6): 2 g via INTRAVENOUS
  Filled 2015-11-28 (×10): qty 50

## 2015-11-28 MED ORDER — MIDAZOLAM HCL 2 MG/2ML IJ SOLN
INTRAMUSCULAR | Status: AC
  Filled 2015-11-28: qty 2

## 2015-11-28 MED ORDER — FAMOTIDINE 20 MG PO TABS: 20.0000 mg | ORAL_TABLET | Freq: Two times a day (BID) | ORAL | Status: DC | PRN

## 2015-11-28 MED ORDER — CEFAZOLIN SODIUM-DEXTROSE 2-3 GM-% IV SOLR
INTRAVENOUS | Status: DC | PRN
  Administered 2015-11-28: 2 g via INTRAVENOUS

## 2015-11-28 MED ORDER — VITAMIN C 500 MG PO TABS
1000.0000 mg | ORAL_TABLET | Freq: Every day | ORAL | Status: DC
  Administered 2015-11-29 – 2015-12-03 (×4): 1000 mg via ORAL
  Filled 2015-11-28 (×4): qty 2

## 2015-11-28 MED ORDER — VANCOMYCIN HCL IN DEXTROSE 1-5 GM/200ML-% IV SOLN
1000.0000 mg | Freq: Three times a day (TID) | INTRAVENOUS | Status: DC
  Administered 2015-11-28 (×2): 1000 mg via INTRAVENOUS
  Filled 2015-11-28 (×3): qty 200

## 2015-11-28 MED ORDER — POTASSIUM CHLORIDE IN NACL 20-0.45 MEQ/L-% IV SOLN
INTRAVENOUS | Status: DC
  Administered 2015-11-28: 01:00:00 via INTRAVENOUS
  Filled 2015-11-28 (×3): qty 1000

## 2015-11-28 MED ORDER — FENTANYL CITRATE (PF) 100 MCG/2ML IJ SOLN
INTRAMUSCULAR | Status: AC
  Administered 2015-11-28: 50 ug via INTRAVENOUS
  Filled 2015-11-28: qty 2

## 2015-11-28 MED ORDER — SUCCINYLCHOLINE CHLORIDE 20 MG/ML IJ SOLN
INTRAMUSCULAR | Status: DC | PRN
  Administered 2015-11-28: 100 mg via INTRAVENOUS

## 2015-11-28 MED ORDER — DIPHENHYDRAMINE HCL 25 MG PO CAPS: 25.0000 mg | ORAL_CAPSULE | Freq: Four times a day (QID) | ORAL | Status: DC | PRN

## 2015-11-28 MED ORDER — METHOCARBAMOL 500 MG PO TABS
500.0000 mg | ORAL_TABLET | Freq: Four times a day (QID) | ORAL | Status: DC | PRN
  Administered 2015-11-28 – 2015-12-03 (×11): 500 mg via ORAL
  Filled 2015-11-28 (×12): qty 1

## 2015-11-28 MED ORDER — SENNA 8.6 MG PO TABS
1.0000 | ORAL_TABLET | Freq: Two times a day (BID) | ORAL | Status: DC
  Administered 2015-11-28 – 2015-12-03 (×9): 8.6 mg via ORAL
  Filled 2015-11-28 (×9): qty 1

## 2015-11-28 MED ORDER — SENNA 8.6 MG PO TABS
1.0000 | ORAL_TABLET | Freq: Two times a day (BID) | ORAL | Status: DC
  Administered 2015-11-28: 8.6 mg via ORAL
  Filled 2015-11-28: qty 1

## 2015-11-28 MED ORDER — FENTANYL CITRATE (PF) 100 MCG/2ML IJ SOLN
INTRAMUSCULAR | Status: DC | PRN
  Administered 2015-11-28: 50 ug via INTRAVENOUS
  Administered 2015-11-28: 100 ug via INTRAVENOUS
  Administered 2015-11-28 (×2): 50 ug via INTRAVENOUS

## 2015-11-28 MED ORDER — BISACODYL 10 MG RE SUPP: 10.0000 mg | Freq: Every day | RECTAL | Status: DC | PRN

## 2015-11-28 MED ORDER — FLUCONAZOLE 200 MG PO TABS
400.0000 mg | ORAL_TABLET | ORAL | Status: DC
  Administered 2015-11-28 – 2015-11-29 (×2): 400 mg via ORAL
  Filled 2015-11-28 (×3): qty 2

## 2015-11-28 MED ORDER — ONDANSETRON HCL 4 MG/2ML IJ SOLN: 4.0000 mg | Freq: Four times a day (QID) | INTRAMUSCULAR | Status: DC | PRN

## 2015-11-28 MED ORDER — FAMOTIDINE 20 MG PO TABS
20.0000 mg | ORAL_TABLET | Freq: Two times a day (BID) | ORAL | Status: DC | PRN
Start: 2015-11-28 — End: 2015-11-28

## 2015-11-28 MED ORDER — MIDAZOLAM HCL 5 MG/5ML IJ SOLN
INTRAMUSCULAR | Status: DC | PRN
  Administered 2015-11-28: 2 mg via INTRAVENOUS

## 2015-11-28 MED ORDER — PROPOFOL 10 MG/ML IV BOLUS
INTRAVENOUS | Status: DC | PRN
  Administered 2015-11-28: 100 mg via INTRAVENOUS

## 2015-11-28 MED ORDER — PROMETHAZINE HCL 25 MG/ML IJ SOLN: 6.2500 mg | INTRAMUSCULAR | Status: DC | PRN

## 2015-11-28 MED ORDER — METHOCARBAMOL 1000 MG/10ML IJ SOLN
500.0000 mg | Freq: Four times a day (QID) | INTRAVENOUS | Status: DC | PRN
  Filled 2015-11-28: qty 5

## 2015-11-28 MED ORDER — FENTANYL CITRATE (PF) 100 MCG/2ML IJ SOLN
25.0000 ug | INTRAMUSCULAR | Status: DC | PRN
  Administered 2015-11-28: 50 ug via INTRAVENOUS

## 2015-11-28 MED ORDER — LACTATED RINGERS IV SOLN
INTRAVENOUS | Status: DC | PRN
  Administered 2015-11-28: 11:00:00 via INTRAVENOUS

## 2015-11-28 MED ORDER — HYDROMORPHONE HCL 1 MG/ML IJ SOLN
1.0000 mg | INTRAMUSCULAR | Status: DC | PRN
  Administered 2015-11-28 – 2015-12-05 (×65): 1 mg via INTRAVENOUS
  Filled 2015-11-28 (×64): qty 1

## 2015-11-28 MED ORDER — BACITRACIN-NEOMYCIN-POLYMYXIN 400-5-5000 EX OINT
TOPICAL_OINTMENT | CUTANEOUS | Status: AC
  Filled 2015-11-28: qty 1

## 2015-11-28 MED ORDER — DOCUSATE SODIUM 100 MG PO CAPS
100.0000 mg | ORAL_CAPSULE | Freq: Two times a day (BID) | ORAL | Status: DC
  Administered 2015-11-28 – 2015-12-03 (×9): 100 mg via ORAL
  Filled 2015-11-28 (×9): qty 1

## 2015-11-28 MED ORDER — LIDOCAINE HCL (CARDIAC) 20 MG/ML IV SOLN
INTRAVENOUS | Status: DC | PRN
  Administered 2015-11-28: 80 mg via INTRAVENOUS

## 2015-11-28 MED ORDER — POLYETHYLENE GLYCOL 3350 17 G PO PACK: 17.0000 g | PACK | Freq: Every day | ORAL | Status: DC | PRN

## 2015-11-28 MED ORDER — SODIUM CHLORIDE 0.9 % IR SOLN: Status: DC | PRN

## 2015-11-28 MED ORDER — DOCUSATE SODIUM 100 MG PO CAPS
100.0000 mg | ORAL_CAPSULE | Freq: Two times a day (BID) | ORAL | Status: DC
  Administered 2015-11-28: 100 mg via ORAL
  Filled 2015-11-28: qty 1

## 2015-11-28 MED ORDER — ALPRAZOLAM 0.5 MG PO TABS
0.5000 mg | ORAL_TABLET | Freq: Four times a day (QID) | ORAL | Status: DC | PRN
  Administered 2015-11-28 – 2015-12-03 (×19): 0.5 mg via ORAL
  Filled 2015-11-28 (×20): qty 1

## 2015-11-28 SURGICAL SUPPLY — 41 items
BANDAGE ACE 4X5 VEL STRL LF (GAUZE/BANDAGES/DRESSINGS) ×2 IMPLANT
BANDAGE ELASTIC 4 VELCRO ST LF (GAUZE/BANDAGES/DRESSINGS) ×4 IMPLANT
BNDG CONFORM 2 STRL LF (GAUZE/BANDAGES/DRESSINGS) IMPLANT
BNDG GAUZE ELAST 4 BULKY (GAUZE/BANDAGES/DRESSINGS) IMPLANT
CORDS BIPOLAR (ELECTRODE) ×2 IMPLANT
CUFF TOURNIQUET SINGLE 18IN (TOURNIQUET CUFF) ×2 IMPLANT
CUFF TOURNIQUET SINGLE 24IN (TOURNIQUET CUFF) IMPLANT
DRSG ADAPTIC 3X8 NADH LF (GAUZE/BANDAGES/DRESSINGS) IMPLANT
DRSG VAC ATS SM SENSATRAC (GAUZE/BANDAGES/DRESSINGS) ×2 IMPLANT
GAUZE SPONGE 4X4 12PLY STRL (GAUZE/BANDAGES/DRESSINGS) ×2 IMPLANT
GAUZE XEROFORM 1X8 LF (GAUZE/BANDAGES/DRESSINGS) ×2 IMPLANT
GAUZE XEROFORM 5X9 LF (GAUZE/BANDAGES/DRESSINGS) ×2 IMPLANT
GLOVE BIOGEL M STRL SZ7.5 (GLOVE) IMPLANT
GLOVE SS BIOGEL STRL SZ 8 (GLOVE) ×1 IMPLANT
GLOVE SUPERSENSE BIOGEL SZ 8 (GLOVE) ×1
GOWN STRL REUS W/ TWL LRG LVL3 (GOWN DISPOSABLE) ×1 IMPLANT
GOWN STRL REUS W/ TWL XL LVL3 (GOWN DISPOSABLE) ×2 IMPLANT
GOWN STRL REUS W/TWL LRG LVL3 (GOWN DISPOSABLE) ×1
GOWN STRL REUS W/TWL XL LVL3 (GOWN DISPOSABLE) ×2
HANDPIECE INTERPULSE COAX TIP (DISPOSABLE)
KIT BASIN OR (CUSTOM PROCEDURE TRAY) ×2 IMPLANT
KIT ROOM TURNOVER OR (KITS) ×2 IMPLANT
MANIFOLD NEPTUNE II (INSTRUMENTS) ×2 IMPLANT
NEEDLE HYPO 25GX1X1/2 BEV (NEEDLE) IMPLANT
NS IRRIG 1000ML POUR BTL (IV SOLUTION) ×2 IMPLANT
PACK ORTHO EXTREMITY (CUSTOM PROCEDURE TRAY) ×2 IMPLANT
PAD ARMBOARD 7.5X6 YLW CONV (MISCELLANEOUS) ×2 IMPLANT
PAD CAST 4YDX4 CTTN HI CHSV (CAST SUPPLIES) ×1 IMPLANT
PAD NEG PRESSURE SENSATRAC (MISCELLANEOUS) ×2 IMPLANT
PADDING CAST COTTON 4X4 STRL (CAST SUPPLIES) ×1
SET HNDPC FAN SPRY TIP SCT (DISPOSABLE) IMPLANT
SPLINT FIBERGLASS 4X15 (CAST SUPPLIES) ×2 IMPLANT
SPONGE GAUZE 4X4 12PLY STER LF (GAUZE/BANDAGES/DRESSINGS) ×2 IMPLANT
SPONGE LAP 4X18 X RAY DECT (DISPOSABLE) IMPLANT
SYR CONTROL 10ML LL (SYRINGE) IMPLANT
TOWEL OR 17X24 6PK STRL BLUE (TOWEL DISPOSABLE) ×2 IMPLANT
TOWEL OR 17X26 10 PK STRL BLUE (TOWEL DISPOSABLE) ×2 IMPLANT
TUBE ANAEROBIC SPECIMEN COL (MISCELLANEOUS) ×2 IMPLANT
TUBE CONNECTING 12X1/4 (SUCTIONS) ×2 IMPLANT
WATER STERILE IRR 1000ML POUR (IV SOLUTION) IMPLANT
YANKAUER SUCT BULB TIP NO VENT (SUCTIONS) ×2 IMPLANT

## 2015-11-28 NOTE — Anesthesia Postprocedure Evaluation (Signed)
Anesthesia Post Note  Patient: Travis Callahan  Procedure(s) Performed: Procedure(s) (LRB): IRRIGATION AND DEBRIDEMENT HAND (Left)  Patient location during evaluation: PACU Anesthesia Type: General Level of consciousness: awake Pain management: pain level controlled Vital Signs Assessment: post-procedure vital signs reviewed and stable Respiratory status: spontaneous breathing Cardiovascular status: stable Anesthetic complications: no    Last Vitals:  Filed Vitals:   11/28/15 1413 11/28/15 1544  BP: 154/98 149/79  Pulse: 72 77  Temp: 36.3 C 36.8 C  Resp: 13 13    Last Pain:  Filed Vitals:   11/28/15 1545  PainSc: Asleep                 EDWARDS,Tempestt Silba

## 2015-11-28 NOTE — H&P (Signed)
  Patient seen and evaluated.  Patient is going to have a second operative debridement today extensive in nature with possible vacuum-assisted closure device placement  He understands risk and benefits.  Patient is stable at this juncture  Chest is clear, heart regular rate, abdomen's nontender.  Left upper extremity is in a bandage will plan for I and D    Cultures are pending however I will re-culture today.  I expect long and drawn out course given all the challenges.  Patient is well aware of the plans.  It is still a question whether he has a true anaphylaxis to penicillin. Although the Epic chart says anaphylaxis- the patient does not know his allergy and was only told that he had a reaction to penicillin as a child. This may represent a situation where he is a candidate for cephalosporins despite what the chart says. I have verbally discussed this with the patient. Although there is a lot of chart talk there is not a lot of face-to-face communicationdocumented  with the patient beyond my face-to-face discussion which truly translates into an unknown reaction to penicillin only- verbalized by his mother as a reaction when he was a child.  I do feel that back in my's and is appropriate given his prior Streptococcus positive cultures and a remote history of MRSA.  We will proceed accordingly.  Patient understands we'll plan for dressing change Monday to elucidate how the wound is declaring itself  All questions have been encouraged and answered and addressed  Patient Active Problem List   Diagnosis Date Noted  . Wound infection ([place]) 11/16/2015  . Abscess of left hand 11/03/2015  . Nausea and vomiting 10/15/2015  . Arm abscess 10/11/2015  . GERD (gastroesophageal reflux disease) 10/11/2015  . Hypokalemia 10/11/2015  . Normocytic anemia 10/11/2015  . Polysubstance abuse 10/11/2015  . Tobacco abuse 10/11/2015  . Abscess of right arm 10/11/2015  . Abscess of arm, right   .  Emesis   . Cellulitis 10/10/2015   Shanecia Hoganson MD

## 2015-11-28 NOTE — Op Note (Signed)
See dictation number [id number] Patient underwent irrigation debridement with radical tenosynovectomy today.  A vacuum-assisted closure device was placed and will plan for repeat trip to the operative theater in 4872 hours.  Patient understands the plans.  The patient was administered 2 g of Ancef and did beautifully with this in the OR. Thus I do feel that we can administer Ancef due to the fact that he has a documented penicillin allergy-that was never truly anaphylactic (  according to his report -he is unaware of what the reaction was).   Cadience Bradfield M.D.

## 2015-11-28 NOTE — Anesthesia Preprocedure Evaluation (Addendum)
Anesthesia Evaluation  Patient identified by MRN, date of birth, ID band Patient awake    Reviewed: Allergy & Precautions, NPO status   Airway Mallampati: II   Neck ROM: Full    Dental   Pulmonary Current Smoker,    breath sounds clear to auscultation       Cardiovascular negative cardio ROS   Rhythm:Regular Rate:Normal     Neuro/Psych    GI/Hepatic Neg liver ROS, PUD, GERD  ,  Endo/Other  negative endocrine ROS  Renal/GU negative Renal ROS     Musculoskeletal   Abdominal   Peds  Hematology   Anesthesia Other Findings   Reproductive/Obstetrics                            Anesthesia Physical Anesthesia Plan  ASA: II  Anesthesia Plan: General   Post-op Pain Management:    Induction: Intravenous and Rapid sequence  Airway Management Planned: Oral ETT  Additional Equipment:   Intra-op Plan:   Post-operative Plan: Extubation in OR  Informed Consent: I have reviewed the patients History and Physical, chart, labs and discussed the procedure including the risks, benefits and alternatives for the proposed anesthesia with the patient or authorized representative who has indicated his/her understanding and acceptance.   Dental advisory given  Plan Discussed with: CRNA, Anesthesiologist and Surgeon  Anesthesia Plan Comments:         Anesthesia Quick Evaluation

## 2015-11-28 NOTE — Transfer of Care (Signed)
Immediate Anesthesia Transfer of Care Note  Patient: Travis Callahan  Procedure(s) Performed: Procedure(s): IRRIGATION AND DEBRIDEMENT HAND (Left)  Patient Location: PACU  Anesthesia Type:General  Level of Consciousness: awake, alert  and oriented  Airway & Oxygen Therapy: Patient Spontanous Breathing and Patient connected to nasal cannula oxygen  Post-op Assessment: Report given to RN and Post -op Vital signs reviewed and stable  Post vital signs: Reviewed and stable  Last Vitals:  Filed Vitals:   11/28/15 0005 11/28/15 0453  BP: 151/86 127/65  Pulse: 95 92  Temp: 37.2 C 36.6 C  Resp: 18 18    Complications: No apparent anesthesia complications

## 2015-11-28 NOTE — Progress Notes (Signed)
Dr. Amedeo Plenty made aware that patient consumed a 12 inch sub sandwich between 0200 and 0530.  Patient was aware that he was to be NPO for scheduled surgery.  Despite this patient consumed 1/2 a honey bun at shift change.  Patient indicated that he assumed he was not having surgery because "I thought Dr. Amedeo Plenty was going to call my phone at 5 am this morning to tell me if I was going down for surgery."  Patient re-educated on the importance of not consuming any other food that may be in the room.  Drinks not at bedside, NPO sign on door.  No breakfast tray was provided this am.  Consents signed per verbal order with read back from Dr. Amedeo Plenty and on chart.  Dr. Amedeo Plenty re-educated patient on NPO and surgery status via telephone.

## 2015-11-28 NOTE — Progress Notes (Signed)
ANTIBIOTIC CONSULT NOTE - INITIAL  Pharmacy Consult for Vancomycin  Indication: Wound infection  Allergies  Allergen Reactions  . Penicillins Anaphylaxis    Has patient had a PCN reaction causing immediate rash, facial/tongue/throat swelling, SOB or lightheadedness with hypotension: Yes Has patient had a PCN reaction causing severe rash involving mucus membranes or skin necrosis: No Has patient had a PCN reaction that required hospitalization No Has patient had a PCN reaction occurring within the last 10 years: No If all of the above answers are "NO", then may proceed with Cephalosporin use. Childhood reaction - told by mom  . Nsaids Other (See Comments)    Ulcers   . Ivp Dye [Iodinated Diagnostic Agents] Other (See Comments)    Needs solumedrol and benadryl prior  . Ketorolac Tromethamine Hives, Itching and Other (See Comments)    STOMACH ULCER  . Morphine And Related Hives and Itching  . Tramadol Nausea And Vomiting    Ulcer    Patient Measurements: Height: 5\' 9"  (175.3 cm) Weight: 160 lb (72.576 kg) IBW/kg (Calculated) : 70.7  Vital Signs: Temp: 98.9 F (37.2 C) (01/07 0005) Temp Source: Oral (01/07 0005) BP: 151/86 mmHg (01/07 0005) Pulse Rate: 95 (01/07 0005)  Labs:  Recent Labs  11/27/15 1609  WBC 7.0  HGB 11.0*  PLT 375  CREATININE 0.77   Estimated Creatinine Clearance: 141.2 mL/min (by C-G formula based on Cr of 0.77). No results for input(s): VANCOTROUGH, VANCOPEAK, VANCORANDOM, GENTTROUGH, GENTPEAK, GENTRANDOM, TOBRATROUGH, TOBRAPEAK, TOBRARND, AMIKACINPEAK, AMIKACINTROU, AMIKACIN in the last 72 hours.   Microbiology: Recent Results (from the past 720 hour(s))  Anaerobic culture     Status: None   Collection Time: 11/03/15  7:34 PM  Result Value Ref Range Status   Specimen Description ABSCESS LEFT HAND  Final   Special Requests NONE  Final   Gram Stain   Final    ABUNDANT WBC PRESENT, PREDOMINANTLY PMN NO SQUAMOUS EPITHELIAL CELLS SEEN NO  ORGANISMS SEEN Performed at [facility]    Culture   Final    NO ANAEROBES ISOLATED Performed at [facility]    Report Status 11/08/2015 FINAL  Final  Culture, routine-abscess     Status: None   Collection Time: 11/03/15  7:34 PM  Result Value Ref Range Status   Specimen Description ABSCESS LEFT HAND  Final   Special Requests NONE  Final   Gram Stain   Final    ABUNDANT WBC PRESENT, PREDOMINANTLY PMN NO SQUAMOUS EPITHELIAL CELLS SEEN NO ORGANISMS SEEN Performed at [facility]    Culture   Final    FEW MICROAEROPHILIC STREPTOCOCCI Note: Standardized susceptibility testing for this organism is not available. Performed at [facility]    Report Status 11/07/2015 FINAL  Final  Surgical pcr screen     Status: Abnormal   Collection Time: 11/16/15  9:15 PM  Result Value Ref Range Status   MRSA, PCR NEGATIVE NEGATIVE Final   Staphylococcus aureus POSITIVE (A) NEGATIVE Final    Comment:        The Xpert SA Assay (FDA approved for NASAL specimens in patients over 26 years of age), is one component of a comprehensive surveillance program.  Test performance has been validated by [facility] for patients greater than or equal to 57 year old. It is not intended to diagnose infection nor to guide or monitor treatment.     Medical History: Past Medical History  Diagnosis Date  . MRSA (methicillin resistant Staphylococcus aureus)   .  Peritonitis ([place])   . Gastric ulcer   . Stomach ulcer   . Crohn's   . Bowel obstruction ([place])   . Ulcer    Assessment: 25 y/o M with well-known history (see H&P), left AMA several days ago and removed cast, now has very challenging open wound, Ancef was ordered but pt has anaphylaxis to PCN on allergy profile, changing to vancomycin after discussion with ortho PA, other labs reviewed.   Goal of Therapy:  Vancomycin trough level 15-20 mcg/ml  Plan:  -Vancomycin 1000 mg IV q8h -Trend WBC, temp, renal  function  -Drug levels as indicated   Narda Bonds 11/28/2015,12:24 AM

## 2015-11-28 NOTE — Anesthesia Procedure Notes (Signed)
Procedure Name: Intubation Date/Time: 11/28/2015 11:52 AM Performed by: Clearnce Sorrel Pre-anesthesia Checklist: Patient identified, Timeout performed, Emergency Drugs available, Suction available and Patient being monitored Patient Re-evaluated:Patient Re-evaluated prior to inductionOxygen Delivery Method: Circle system utilized Preoxygenation: Pre-oxygenation with 100% oxygen Intubation Type: IV induction, Cricoid Pressure applied and Rapid sequence Laryngoscope Size: Mac and 4 Grade View: Grade I Tube type: Oral Tube size: 7.5 mm Number of attempts: 1 Airway Equipment and Method: Stylet Placement Confirmation: ETT inserted through vocal cords under direct vision,  breath sounds checked- equal and bilateral and positive ETCO2 Secured at: 23 cm Tube secured with: Tape Dental Injury: Teeth and Oropharynx as per pre-operative assessment

## 2015-11-28 NOTE — Progress Notes (Signed)
Will do formal consult tomorrow:  For empiric coverage = will give cefazolin (dec cx showed microaerophilic strep), vancomycin (to cover mrsa), fluconazole (hx of candidal infection)  Patient has tolerated cephalosporins as confirmed by pharmacy.   Will d/c gent

## 2015-11-28 NOTE — Op Note (Signed)
Travis Callahan, DEGRAY NO.:  [id number]  MEDICAL RECORD NO.:  [id number]  LOCATION:  5N03C                        FACILITY:  [facility]  PHYSICIAN:  Satira Anis. 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  The patient recently left the hospital AMA and is presented with a recurrent infection after his medical decision process did not turn out to his satisfaction.  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  The patient recently left the hospital AMA and is presented with a recurrent infection after his medical decision process did not turn out to his satisfaction.  PROCEDURES: 1. Irrigation and debridement of deep abscess, left hand. 2. Extensive/radical tenosynovectomy.  Tendon apparatus extensor     surface, left hand and wrist including the fourth dorsal     compartment and the extensor digitorum communis, extensor indicis     proprius and extensor digiti minimi tendons. 3. Vacuum-assisted closure device (VAC) application.  SURGEON:  Satira Anis. Amedeo Plenty, M.D.  ASSISTANT:  None.  COMPLICATION:  None.  ANESTHESIA:  General.  TOURNIQUET TIME:  Less than an hour.  INDICATIONS:  The patient is a completely challenging 25 year old male who has had IV drug abuse in the right arm, treated by myself to a fairly quiescent state of affairs.  He recently (the month of December) presented with a new-onset IV drug infection in the left hand.  He is being admitted.  He has been in numerous ERs and has had numerous identities in his [facility] chart.  In addition to this, he had signed out AMA quite a few times including this most recent visit.  After an extensive attempt at trying to salvage the left hand after  a necrotic infection, he decided against further medical treatment and left on his own accord.  Suffice it to say, this is one of the more challenging patients in terms of his manipulative behavior and poor acknowledgement of the problems that he has.  Nevertheless, we were trying to help this gentleman.  He presents with a necrosis about the left hand as his medical decision making turned out to be quite unsatisfactory.  I should note, I saw him last night, irrigated and debrided him as he had a full stomach in the [facility] Emergency Room and then admitted him myself to the hospital.  OPERATIVE PROCEDURE:  The patient was seen by myself and Anesthesia.  He was counseled, he understands the plan of care.  He went to the operative arena, was prepped and draped in usual sterile fashion with Betadine scrub and paint after general anesthetic was employed.  Time- out was observed and following this, the patient underwent excisional debridement of skin, subcutaneous tissue, muscle, tendon, paratenon tissue and associated structures with curettes, scalpel and scissor. This was an excisional debridement and 6 liters of saline were placed through and through the wound on the dorsum aspect of his hand.  Following this, I have performed an aggressive tenolysis, tenosynovectomy radical  in nature about the extensor indicis proprius, extensor digitorum minimi, extensor digitorum communis to the index, middle, ring and small fingers.  All tendons were stripped off any necrotic tissue.  I then irrigated additionally and had a liter and following this, made sure that all areas looked excellent.  I was pleased with these findings.  Following this, we then performed placement of a VAC.  A sponge was fitted and placed suction, looked excellent until I will hook him up to a 75 mm of continuous suction.  We will return to the operative theater in 48 hours.  I would give him a variable prognosis.  I  should also note that there was lot of chart chatter about his penicillin allergy.  There was not a lot of face-to-face conversations documented.  I as his admitting physician have performed face-to-face conversations and felt comfortable administering a test dose of 2 g of Ancef, which he did beautifully with.  There were no complicating features or anaphylaxis.  Thus, I do not feel he has a cephalosporin cross reaction and I do feel that cephalosporin would be an option for him into the future.  He was dressed with Xeroform, gauze, Webril, 4x4s and a volar splint. Do's and don'ts have been discussed and all questions have been encouraged and answered.  Should any problems arise, he will notify us. Our plan will be for continued irrigation and debridement and skin graft versus primary closure after a significant delay to make sure that tissues are stable.     Satira Anis. Amedeo Plenty, M.D.     [facility]  D:  11/28/2015  T:  11/28/2015  Job:  [id number]

## 2015-11-29 DIAGNOSIS — Z9049 Acquired absence of other specified parts of digestive tract: Secondary | ICD-10-CM

## 2015-11-29 DIAGNOSIS — L02512 Cutaneous abscess of left hand: Secondary | ICD-10-CM

## 2015-11-29 DIAGNOSIS — B182 Chronic viral hepatitis C: Secondary | ICD-10-CM | POA: Diagnosis present

## 2015-11-29 DIAGNOSIS — B9561 Methicillin susceptible Staphylococcus aureus infection as the cause of diseases classified elsewhere: Secondary | ICD-10-CM

## 2015-11-29 DIAGNOSIS — Z8719 Personal history of other diseases of the digestive system: Secondary | ICD-10-CM

## 2015-11-29 DIAGNOSIS — Z9889 Other specified postprocedural states: Secondary | ICD-10-CM

## 2015-11-29 DIAGNOSIS — F1910 Other psychoactive substance abuse, uncomplicated: Secondary | ICD-10-CM

## 2015-11-29 DIAGNOSIS — Z8619 Personal history of other infectious and parasitic diseases: Secondary | ICD-10-CM

## 2015-11-29 LAB — PROTIME-INR
INR: 1.12 (ref 0.00–1.49)
PROTHROMBIN TIME: 14.5 s (ref 11.6–15.2)

## 2015-11-29 NOTE — Progress Notes (Signed)
Subjective: 1 Day Post-Op Procedure(s) (LRB): IRRIGATION AND DEBRIDEMENT HAND (Left) Patient reports pain as controlled.  Resting well in bed. No complaints this am. Reports a good night. Denies F/C or N/V.  Objective: Vital signs in last 24 hours: Temp:  [97.3 F (36.3 C)-99.4 F (37.4 C)] 98 F (36.7 C) (01/08 0526) Pulse Rate:  [72-85] 75 (01/08 0526) Resp:  [12-19] 16 (01/08 0526) BP: (120-172)/(63-98) 120/63 mmHg (01/08 0526) SpO2:  [98 %-100 %] 98 % (01/08 0526)  Intake/Output from previous day: 01/07 0701 - 01/08 0700 In: 700 [I.V.:700] Out: 1545 [Urine:1500; Drains:20; Blood:25] Intake/Output this shift: Total I/O In: 120 [P.O.:120] Out: 0    Recent Labs  11/27/15 1609  HGB 11.0*    Recent Labs  11/27/15 1609  WBC 7.0  RBC 3.94*  HCT 34.4*  PLT 375    Recent Labs  11/27/15 1609  NA 145  K 3.3*  CL 110  CO2 25  BUN 10  CREATININE 0.77  GLUCOSE 89  CALCIUM 9.3   No results for input(s): LABPT, INR in the last 72 hours.  Alert and oriented x3. Looks to be comfortably in bed. No acute distress. Left hand dressing c/d/i. Wound vac working well.  Assessment/Plan: 1 Day Post-Op Procedure(s) (LRB): IRRIGATION AND DEBRIDEMENT HAND (Left) Plan for I&D and change wound vac tomorrow. Dr. Amedeo Plenty to further eval tomorrow Seen with Dr.Collins today Continue current care Patient appreciates Dr Carmelia Bake care.  Gamble Enderle L 11/29/2015, 11:25 AM

## 2015-11-29 NOTE — Care Management (Signed)
Utilization review completed. Nashly Olsson, RN Case Manager [phone]. 

## 2015-11-29 NOTE — Consult Note (Signed)
[facility] for Infectious Disease  Total days of antibiotics 3        Day 3 cefazolin        Day 3 vanco        Day 2 fluc       Reason for Consult: deep tissue infection of left hand   Referring Physician: gramig  Active Problems:   Abscess of left hand    HPI: Travis Callahan is a 25 y.o. 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 He previously required numerous I X D to right AC fossa treated for candidal infection which is now healed in Winter 2016. He has stopped IVDU, but now has wound to left dorsum of hand that  required several I x D as of mid December for deep tissue infection to left hand. His cx in mid dec showed microaerophilic strep. He left AMA at end of December and now returns due to worsening pain, drainage to open wound to his left hand. Dr Amedeo Plenty did I x D on 1/6, and 1/7 for Irrigation and debridement of deep abscess, left hand with Extensive/radical tenosynovectomy as well as I x D Tendon apparatus extensor surface, left hand and wrist including the fourth dorsalcompartment and the extensor digitorum communis, extensor indicis proprius and extensor digiti minimi tendons with Vacuum-assisted closure device (VAC) application. He was started on vanco, cefazolin plus fluconazole due to wound looking similar to his previous infection to right arm. Cultures thus far identifying staph aureus. Sensitivities are pending. Planning for next I x D on 1/9.  Patient remains afebrile. Wound to right AC is slowly healing. No warmth, redness or drainage  Patient reports that roughly 9 months ago, he had appendicitis that was complicated with bowel perf and peritonitis requiring bowel resection, has midline abdominal surgical scar.  He reports that he has hep C from birth, mother also has hep C. No prior treatment.  Past Medical History  Diagnosis Date  . MRSA (methicillin resistant  Staphylococcus aureus)   . Peritonitis ([place])   . Gastric ulcer   . Stomach ulcer   . Crohn's   . Bowel obstruction ([place])   . Ulcer     Allergies:  Allergies  Allergen Reactions  . Penicillins Other (See Comments)    HAS TOLERATED CEFAZOLIN WITHOUT PROBLEM Childhood reaction  . Nsaids Other (See Comments)    Ulcers   . Ivp Dye [Iodinated Diagnostic Agents] Other (See Comments)    Needs solumedrol and benadryl prior  . Ketorolac Tromethamine Hives, Itching and Other (See Comments)    STOMACH ULCER  . Morphine And Related Hives and Itching  . Tramadol Nausea And Vomiting    Ulcer     MEDICATIONS: .  ceFAZolin (ANCEF) IV  2 g Intravenous Q8H  . docusate sodium  100 mg Oral BID  . fluconazole  400 mg Oral Q24H  . senna  1 tablet Oral BID  . vancomycin  1,000 mg Intravenous Q8H  . vitamin C  1,000 mg Oral Daily    Social History  Substance Use Topics  . Smoking status: Current Every Day Smoker -- 0.25 packs/day    Types: Cigarettes  . Smokeless tobacco: None  . Alcohol Use: No     Comment: socially  - last used ivdu 45 days ago per patient  Family History  Problem Relation Age of Onset  . Heart failure  Mother   . Congestive Heart Failure Mother   - mother with hep C   Review of Systems  Constitutional: positive for fever, chills, diaphoresis, but no change in activity change, appetite change, fatigue and unexpected weight change.  HENT: Negative for congestion, sore throat, rhinorrhea, sneezing, trouble swallowing and sinus pressure.  Eyes: Negative for photophobia and visual disturbance.  Respiratory: Negative for cough, chest tightness, shortness of breath, wheezing and stridor.  Cardiovascular: Negative for chest pain, palpitations and leg swelling.  Gastrointestinal: Negative for nausea, vomiting, abdominal pain, diarrhea, constipation, blood in stool, abdominal distention and anal bleeding.  Genitourinary: Negative for dysuria, hematuria, flank pain and  difficulty urinating.  Skin = left hand wound, drainage, swelling and redness Neurological: Negative for dizziness, tremors, weakness and light-headedness.  Hematological: Negative for adenopathy. Does not bruise/bleed easily.  Psychiatric/Behavioral: Negative for behavioral problems, confusion, sleep disturbance, dysphoric mood, decreased concentration and agitation.     OBJECTIVE: Temp:  [97.3 F (36.3 C)-99.4 F (37.4 C)] 98 F (36.7 C) (01/08 0526) Pulse Rate:  [72-85] 75 (01/08 0526) Resp:  [12-19] 16 (01/08 0526) BP: (120-172)/(63-98) 120/63 mmHg (01/08 0526) SpO2:  [98 %-100 %] 98 % (01/08 0526) Physical Exam  Constitutional: He is oriented to person, place, and time. He appears well-developed and well-nourished. No distress.  HENT:  Mouth/Throat: Oropharynx is clear and moist. No oropharyngeal exudate.  Cardiovascular: Normal rate, regular rhythm and normal heart sounds. Exam reveals no gallop and no friction rub.  No murmur heard.  Pulmonary/Chest: Effort normal and breath sounds normal. No respiratory distress. He has no wheezes.  Abdominal: Soft. Bowel sounds are normal. He exhibits no distension. There is no tenderness. Midline surgical incision scar Lymphadenopathy:  He has no cervical adenopathy.  Neurological: He is alert and oriented to person, place, and time.  Skin: left hand /fore arm is wrapped from surgery with wound vac in place. Right AC scar tissue from prior I x D surgeries Psychiatric: He has a normal mood and affect. His behavior is normal.    LABS: Results for orders placed or performed during the hospital encounter of 11/27/15 (from the past 48 hour(s))  Culture, blood (routine x 2)     Status: None (Preliminary result)   Collection Time: 11/27/15  4:07 PM  Result Value Ref Range   Specimen Description BLOOD RIGHT HAND    Special Requests BOTTLES DRAWN AEROBIC AND ANAEROBIC 4 ML    Culture      NO GROWTH 2 DAYS Performed at [facility]      Report Status PENDING   Comprehensive metabolic panel     Status: Abnormal   Collection Time: 11/27/15  4:09 PM  Result Value Ref Range   Sodium 145 135 - 145 mmol/L   Potassium 3.3 (L) 3.5 - 5.1 mmol/L   Chloride 110 101 - 111 mmol/L   CO2 25 22 - 32 mmol/L   Glucose, Bld 89 65 - 99 mg/dL   BUN 10 6 - 20 mg/dL   Creatinine, Ser 0.77 0.61 - 1.24 mg/dL   Calcium 9.3 8.9 - 10.3 mg/dL   Total Protein 8.2 (H) 6.5 - 8.1 g/dL   Albumin 4.3 3.5 - 5.0 g/dL   AST 21 15 - 41 U/L   ALT 20 17 - 63 U/L   Alkaline Phosphatase 75 38 - 126 U/L   Total Bilirubin 0.8 0.3 - 1.2 mg/dL   GFR calc non Af Amer >60 >60 mL/min   GFR calc Af Amer >  60 >60 mL/min    Comment: (NOTE) The eGFR has been calculated using the CKD EPI equation. This calculation has not been validated in all clinical situations. eGFR's persistently <60 mL/min signify possible Chronic Kidney Disease.    Anion gap 10 5 - 15  CBC with Differential     Status: Abnormal   Collection Time: 11/27/15  4:09 PM  Result Value Ref Range   WBC 7.0 4.0 - 10.5 K/uL   RBC 3.94 (L) 4.22 - 5.81 MIL/uL   Hemoglobin 11.0 (L) 13.0 - 17.0 g/dL   HCT 34.4 (L) 39.0 - 52.0 %   MCV 87.3 78.0 - 100.0 fL   MCH 27.9 26.0 - 34.0 pg   MCHC 32.0 30.0 - 36.0 g/dL   RDW 14.4 11.5 - 15.5 %   Platelets 375 150 - 400 K/uL   Neutrophils Relative % 60 %   Neutro Abs 4.2 1.7 - 7.7 K/uL   Lymphocytes Relative 29 %   Lymphs Abs 2.1 0.7 - 4.0 K/uL   Monocytes Relative 10 %   Monocytes Absolute 0.7 0.1 - 1.0 K/uL   Eosinophils Relative 1 %   Eosinophils Absolute 0.1 0.0 - 0.7 K/uL   Basophils Relative 0 %   Basophils Absolute 0.0 0.0 - 0.1 K/uL  I-Stat CG4 Lactic Acid, ED  (not at [facility])     Status: None   Collection Time: 11/27/15  4:16 PM  Result Value Ref Range   Lactic Acid, Venous 1.43 0.5 - 2.0 mmol/L  Culture, blood (routine x 2)     Status: None (Preliminary result)   Collection Time: 11/27/15  4:30 PM  Result Value Ref Range   Specimen  Description BLOOD RIGHT HAND    Special Requests BOTTLES DRAWN AEROBIC AND ANAEROBIC 5 CC    Culture      NO GROWTH 2 DAYS Performed at [facility]    Report Status PENDING   Wound culture     Status: None (Preliminary result)   Collection Time: 11/27/15  8:54 PM  Result Value Ref Range   Specimen Description WOUND LEFT HAND    Special Requests Normal    Gram Stain PENDING    Culture      ABUNDANT STAPHYLOCOCCUS AUREUS Note: RIFAMPIN AND GENTAMICIN SHOULD NOT BE USED AS SINGLE DRUGS FOR TREATMENT OF STAPH INFECTIONS. Performed at [facility]    Report Status PENDING   MRSA PCR Screening     Status: None   Collection Time: 11/28/15 12:35 AM  Result Value Ref Range   MRSA by PCR NEGATIVE NEGATIVE    Comment:        The GeneXpert MRSA Assay (FDA approved for NASAL specimens only), is one component of a comprehensive MRSA colonization surveillance program. It is not intended to diagnose MRSA infection nor to guide or monitor treatment for MRSA infections.     MICRO: 1/6 staph aureus, sensi pending 1/7 cx pending IMAGING: Dg Hand 2 View Left  11/28/2015  CLINICAL DATA:  Dorsal left hand abscess. EXAM: LEFT HAND - 2 VIEW COMPARISON:  11/27/2015 left hand radiographs. FINDINGS: Severe soft tissue swelling in the dorsal left hand with associated soft tissue defect likely representing postsurgical change. No appreciable cortical erosions or periosteal reaction. No appreciable fracture, dislocation or suspicious focal osseous lesion. IMPRESSION: Severe dorsal left hand soft tissue swelling, with no radiographic evidence of osteomyelitis. Electronically Signed   By: Ilona Sorrel M.D.   On: 11/28/2015 08:54   Dg Hand Complete  Left  11/27/2015  CLINICAL DATA:  LEFT hand surgery 5 days prior. Open wound on the dorsal aspect of the hand. EXAM: LEFT HAND - COMPLETE 3+ VIEW COMPARISON:  None. FINDINGS: Soft tissue swelling of the dorsum of the hand at the level of the distal  metacarpals. No foreign body. No osseous abnormality. No subcutaneous gas. IMPRESSION: Soft tissue swelling of the dorsum of the hand. No osseous abnormality. Electronically Signed   By: Suzy Bouchard M.D.   On: 11/27/2015 18:10    Assessment/Plan:  25yo M with hx of ivdu, previous deep tissue infection to right AC of arm, now has left hand deep tissue infection with staph aureus. Cultures still pending  Staph aureus deep wound infection = for now continue with vancomycin and cefazolin. Concern for polymicrobial infection. If cx show no yeast on gram stain, can likely d/c fluconazole. Sensitivities should return tomorrow to be able to narrow his abtx regimen. He would be a good candidate for ortivancin since it would give him 10-14d of systemic coverage, plus then can give oral abtx to finish out course. Still would not place a picc line in him, due to recent admission of IVDU in the <2 mo.   Other infectious disease relating to IVDU = will check hiv ab  Chronic hep c without hepatic coma = will check hep c ab, hep c viral load, and genotype. Will check cmp, inr. Will refer to [place] [facility] for management.  Dr. Megan Salon to see tomorrow for further [facility] [phone]

## 2015-11-30 ENCOUNTER — Inpatient Hospital Stay (HOSPITAL_COMMUNITY): Payer: Self-pay | Admitting: Certified Registered Nurse Anesthetist

## 2015-11-30 ENCOUNTER — Encounter (HOSPITAL_COMMUNITY): Payer: Self-pay | Admitting: Certified Registered Nurse Anesthetist

## 2015-11-30 ENCOUNTER — Encounter (HOSPITAL_COMMUNITY)
Admission: EM | Disposition: A | Discharge status: Home / Self Care | Payer: Self-pay | Source: Home / Self Care | Attending: Orthopedic Surgery

## 2015-11-30 DIAGNOSIS — B9562 Methicillin resistant Staphylococcus aureus infection as the cause of diseases classified elsewhere: Secondary | ICD-10-CM

## 2015-11-30 HISTORY — PX: I & D EXTREMITY: SHX5045

## 2015-11-30 HISTORY — PX: APPLICATION OF WOUND VAC: SHX5189

## 2015-11-30 LAB — HIV ANTIBODY (ROUTINE TESTING W REFLEX): HIV Screen 4th Generation wRfx: NONREACTIVE

## 2015-11-30 LAB — WOUND CULTURE: Special Requests: NORMAL

## 2015-11-30 LAB — BASIC METABOLIC PANEL
ANION GAP: 10 (ref 5–15)
BUN: 8 mg/dL (ref 6–20)
CALCIUM: 8.9 mg/dL (ref 8.9–10.3)
CO2: 28 mmol/L (ref 22–32)
CREATININE: 1.29 mg/dL — AB (ref 0.61–1.24)
Chloride: 97 mmol/L — ABNORMAL LOW (ref 101–111)
GFR calc Af Amer: >60 mL/min (ref >60)
GLUCOSE: 103 mg/dL — AB (ref 65–99)
Potassium: 4.2 mmol/L (ref 3.5–5.1)
Sodium: 135 mmol/L (ref 135–145)

## 2015-11-30 LAB — VANCOMYCIN, TROUGH: VANCOMYCIN TR: 8 ug/mL — AB (ref 10.0–20.0)

## 2015-11-30 SURGERY — IRRIGATION AND DEBRIDEMENT EXTREMITY
Anesthesia: General | Site: Hand | Laterality: Left

## 2015-11-30 MED ORDER — BUPIVACAINE HCL (PF) 0.25 % IJ SOLN
INTRAMUSCULAR | Status: AC
  Filled 2015-11-30: qty 30

## 2015-11-30 MED ORDER — MIDAZOLAM HCL 2 MG/2ML IJ SOLN
INTRAMUSCULAR | Status: AC
  Filled 2015-11-30: qty 2

## 2015-11-30 MED ORDER — POTASSIUM CHLORIDE IN NACL 20-0.45 MEQ/L-% IV SOLN: INTRAVENOUS | Status: DC

## 2015-11-30 MED ORDER — LACTATED RINGERS IV SOLN
INTRAVENOUS | Status: DC | PRN
  Administered 2015-11-30: 08:00:00 via INTRAVENOUS

## 2015-11-30 MED ORDER — MIDAZOLAM HCL 5 MG/5ML IJ SOLN
INTRAMUSCULAR | Status: DC | PRN
  Administered 2015-11-30: 2 mg via INTRAVENOUS

## 2015-11-30 MED ORDER — BUPIVACAINE HCL (PF) 0.25 % IJ SOLN
INTRAMUSCULAR | Status: DC | PRN
  Administered 2015-11-30: 30 mL

## 2015-11-30 MED ORDER — MORPHINE SULFATE (PF) 2 MG/ML IV SOLN: 2.0000 mg | INTRAVENOUS | Status: DC | PRN

## 2015-11-30 MED ORDER — PROPOFOL 10 MG/ML IV BOLUS
INTRAVENOUS | Status: DC | PRN
  Administered 2015-11-30: 200 mg via INTRAVENOUS

## 2015-11-30 MED ORDER — PROPOFOL 10 MG/ML IV BOLUS
INTRAVENOUS | Status: AC
  Filled 2015-11-30: qty 20

## 2015-11-30 MED ORDER — DIPHENHYDRAMINE HCL 25 MG PO CAPS: 25.0000 mg | ORAL_CAPSULE | Freq: Four times a day (QID) | ORAL | Status: DC | PRN

## 2015-11-30 MED ORDER — SODIUM CHLORIDE 0.9 % IR SOLN
Status: DC | PRN
  Administered 2015-11-30 (×2): 3000 mL

## 2015-11-30 MED ORDER — CHLORHEXIDINE GLUCONATE CLOTH 2 % EX PADS
6.0000 | MEDICATED_PAD | Freq: Every day | CUTANEOUS | Status: AC
  Administered 2015-11-30 – 2015-12-02 (×3): 6 via TOPICAL

## 2015-11-30 MED ORDER — 0.9 % SODIUM CHLORIDE (POUR BTL) OPTIME
TOPICAL | Status: DC | PRN
  Administered 2015-11-30: 1000 mL

## 2015-11-30 MED ORDER — PROMETHAZINE HCL 25 MG/ML IJ SOLN: 6.2500 mg | INTRAMUSCULAR | Status: DC | PRN

## 2015-11-30 MED ORDER — HYDROMORPHONE HCL 1 MG/ML IJ SOLN: 0.2500 mg | INTRAMUSCULAR | Status: DC | PRN

## 2015-11-30 MED ORDER — FENTANYL CITRATE (PF) 250 MCG/5ML IJ SOLN
INTRAMUSCULAR | Status: AC
  Filled 2015-11-30: qty 5

## 2015-11-30 MED ORDER — ONDANSETRON HCL 4 MG/2ML IJ SOLN
INTRAMUSCULAR | Status: DC | PRN
  Administered 2015-11-30: 4 mg via INTRAVENOUS

## 2015-11-30 MED ORDER — LIDOCAINE HCL (CARDIAC) 20 MG/ML IV SOLN
INTRAVENOUS | Status: DC | PRN
  Administered 2015-11-30: 80 mg via INTRAVENOUS

## 2015-11-30 MED ORDER — VANCOMYCIN HCL 10 G IV SOLR
1250.0000 mg | Freq: Three times a day (TID) | INTRAVENOUS | Status: DC
  Administered 2015-11-30 – 2015-12-04 (×11): 1250 mg via INTRAVENOUS
  Filled 2015-11-30 (×13): qty 1250

## 2015-11-30 MED ORDER — MEPERIDINE HCL 25 MG/ML IJ SOLN: 6.2500 mg | INTRAMUSCULAR | Status: DC | PRN

## 2015-11-30 MED ORDER — FENTANYL CITRATE (PF) 100 MCG/2ML IJ SOLN
INTRAMUSCULAR | Status: DC | PRN
  Administered 2015-11-30: 50 ug via INTRAVENOUS

## 2015-11-30 MED ORDER — MUPIROCIN 2 % EX OINT
TOPICAL_OINTMENT | Freq: Two times a day (BID) | CUTANEOUS | Status: AC
Start: 2015-11-30 — End: 2015-12-04
  Administered 2015-11-30 (×2): via NASAL
  Administered 2015-12-01: 1 via NASAL
  Administered 2015-12-01 – 2015-12-04 (×7): via NASAL
  Filled 2015-11-30: qty 22

## 2015-11-30 MED ORDER — LACTATED RINGERS IV SOLN: INTRAVENOUS | Status: DC

## 2015-11-30 SURGICAL SUPPLY — 46 items
BANDAGE ELASTIC 4 VELCRO ST LF (GAUZE/BANDAGES/DRESSINGS) ×4 IMPLANT
BNDG CONFORM 2 STRL LF (GAUZE/BANDAGES/DRESSINGS) IMPLANT
BNDG GAUZE ELAST 4 BULKY (GAUZE/BANDAGES/DRESSINGS) ×6 IMPLANT
CORDS BIPOLAR (ELECTRODE) ×2 IMPLANT
CUFF TOURNIQUET SINGLE 18IN (TOURNIQUET CUFF) ×2 IMPLANT
CUFF TOURNIQUET SINGLE 24IN (TOURNIQUET CUFF) IMPLANT
DRAPE INCISE IOBAN 66X45 STRL (DRAPES) ×2 IMPLANT
DRSG ADAPTIC 3X8 NADH LF (GAUZE/BANDAGES/DRESSINGS) IMPLANT
DRSG VAC ATS SM SENSATRAC (GAUZE/BANDAGES/DRESSINGS) ×2 IMPLANT
FLUID NSS /IRRIG 3000 ML XXX (IV SOLUTION) ×4 IMPLANT
GAUZE SPONGE 4X4 12PLY STRL (GAUZE/BANDAGES/DRESSINGS) ×2 IMPLANT
GAUZE XEROFORM 1X8 LF (GAUZE/BANDAGES/DRESSINGS) IMPLANT
GAUZE XEROFORM 5X9 LF (GAUZE/BANDAGES/DRESSINGS) ×2 IMPLANT
GLOVE BIO SURGEON STRL SZ7 (GLOVE) ×2 IMPLANT
GLOVE BIOGEL M STRL SZ7.5 (GLOVE) ×4 IMPLANT
GLOVE BIOGEL PI IND STRL 6.5 (GLOVE) ×1 IMPLANT
GLOVE BIOGEL PI IND STRL 7.0 (GLOVE) ×2 IMPLANT
GLOVE BIOGEL PI INDICATOR 6.5 (GLOVE) ×1
GLOVE BIOGEL PI INDICATOR 7.0 (GLOVE) ×2
GLOVE ECLIPSE 7.0 STRL STRAW (GLOVE) ×2 IMPLANT
GLOVE SS BIOGEL STRL SZ 8 (GLOVE) ×2 IMPLANT
GLOVE SUPERSENSE BIOGEL SZ 8 (GLOVE) ×2
GOWN STRL REUS W/ TWL LRG LVL3 (GOWN DISPOSABLE) ×2 IMPLANT
GOWN STRL REUS W/ TWL XL LVL3 (GOWN DISPOSABLE) ×1 IMPLANT
GOWN STRL REUS W/TWL LRG LVL3 (GOWN DISPOSABLE) ×2
GOWN STRL REUS W/TWL XL LVL3 (GOWN DISPOSABLE) ×1
HANDPIECE INTERPULSE COAX TIP (DISPOSABLE)
KIT BASIN OR (CUSTOM PROCEDURE TRAY) ×2 IMPLANT
KIT ROOM TURNOVER OR (KITS) ×2 IMPLANT
MANIFOLD NEPTUNE II (INSTRUMENTS) ×2 IMPLANT
NEEDLE HYPO 25GX1X1/2 BEV (NEEDLE) ×2 IMPLANT
NS IRRIG 1000ML POUR BTL (IV SOLUTION) ×2 IMPLANT
PACK ORTHO EXTREMITY (CUSTOM PROCEDURE TRAY) ×2 IMPLANT
PAD ARMBOARD 7.5X6 YLW CONV (MISCELLANEOUS) ×2 IMPLANT
PAD CAST 4YDX4 CTTN HI CHSV (CAST SUPPLIES) ×1 IMPLANT
PADDING CAST COTTON 4X4 STRL (CAST SUPPLIES) ×1
SET HNDPC FAN SPRY TIP SCT (DISPOSABLE) IMPLANT
SPLINT FIBERGLASS 3X12 (CAST SUPPLIES) ×2 IMPLANT
SPONGE LAP 4X18 X RAY DECT (DISPOSABLE) ×2 IMPLANT
SYR CONTROL 10ML LL (SYRINGE) ×2 IMPLANT
TOWEL OR 17X24 6PK STRL BLUE (TOWEL DISPOSABLE) ×2 IMPLANT
TOWEL OR 17X26 10 PK STRL BLUE (TOWEL DISPOSABLE) ×2 IMPLANT
TUBE ANAEROBIC SPECIMEN COL (MISCELLANEOUS) IMPLANT
TUBE CONNECTING 12X1/4 (SUCTIONS) ×2 IMPLANT
WATER STERILE IRR 1000ML POUR (IV SOLUTION) IMPLANT
YANKAUER SUCT BULB TIP NO VENT (SUCTIONS) ×2 IMPLANT

## 2015-11-30 NOTE — Transfer of Care (Signed)
Immediate Anesthesia Transfer of Care Note  Patient: Travis Callahan  Procedure(s) Performed: Procedure(s): IRRIGATION AND DEBRIDEMENT OF LEFT HAND (Left) APPLICATION OF WOUND VAC (Left)  Patient Location: PACU  Anesthesia Type:General  Level of Consciousness: awake, alert , patient cooperative and responds to stimulation  Airway & Oxygen Therapy: Patient Spontanous Breathing and Patient connected to nasal cannula oxygen  Post-op Assessment: Report given to RN, Post -op Vital signs reviewed and stable and Patient moving all extremities X 4  Post vital signs: Reviewed and stable  Last Vitals:  Filed Vitals:   11/29/15 2008 11/30/15 0448  BP: 128/61 107/77  Pulse: 77 80  Temp: 37.3 C 36.7 C  Resp: 16 16    Complications: No apparent anesthesia complications

## 2015-11-30 NOTE — Progress Notes (Signed)
RN called into the room by NT who witnessed the patient smoking vapor in his room. RN entered the room and asked the patient to not smoke in his room because [place] is a smoke free campus. RN placed smoking vapor device in the patients closet (classified by patient as [place]). Patient states he understands [place] and states that he will not smoke anymore in the room. Nursing will continue to monitor.

## 2015-11-30 NOTE — Anesthesia Postprocedure Evaluation (Signed)
Anesthesia Post Note  Patient: Travis Callahan  Procedure(s) Performed: Procedure(s) (LRB): IRRIGATION AND DEBRIDEMENT OF LEFT HAND (Left) APPLICATION OF WOUND VAC (Left)  Patient location during evaluation: PACU Anesthesia Type: General Level of consciousness: awake and alert Pain management: pain level controlled Vital Signs Assessment: post-procedure vital signs reviewed and stable Respiratory status: spontaneous breathing, nonlabored ventilation, respiratory function stable and patient connected to nasal cannula oxygen Cardiovascular status: blood pressure returned to baseline and stable Postop Assessment: no signs of nausea or vomiting Anesthetic complications: no    Last Vitals:  Filed Vitals:   11/30/15 0924 11/30/15 0941  BP: 164/79 163/88  Pulse: 72 74  Temp:  36.6 C  Resp: 9 14    Last Pain:  Filed Vitals:   11/30/15 0941  PainSc: Asleep                 Effie Berkshire

## 2015-11-30 NOTE — Op Note (Signed)
NAMECAMARON, Travis Callahan NO.:  [id number]  MEDICAL RECORD NO.:  [id number]  LOCATION:  [facility]CPO                         FACILITY:  [address]  PHYSICIAN:  Satira Anis. Citlally Captain, M.D.DATE OF BIRTH:  07/23/1991  DATE OF PROCEDURE:  11/30/2015 DATE OF DISCHARGE:                              OPERATIVE REPORT   PREOPERATIVE DIAGNOSIS:  Left hand abscess secondary to intravenous drug abuse with history of multiple previous irrigation and debridements. The patient presents for repeat incision and drainage and repair of structures as necessary.  OPERATION PERFORMED: 1. Irrigation and debridement of abscess, left hand.  This was an     excisional debridement with curette, knife, blade, and scissor     involving tendon, muscle, subcu and skin. 2. Radical tenosynovectomy fourth dorsal compartment of the hand     including the extensor digitorum communis to the index, middle,     ring, and small fingers and the extensor indicis proprius as well     as the extensor digiti minimi tendons. 3. Application of a vacuum-assisted closure device, left hand.  SURGEON:  Satira Anis. Amedeo Plenty, M.D.  ASSISTANT:  None.  COMPLICATIONS:  None.  ANESTHESIA:  General.  TOURNIQUET TIME:  Less than 15 minutes.  INDICATIONS:  This is a very challenging 25 year old male who presents with the above-mentioned diagnosis.  I have counseled him in regard to risks and benefits of surgery including risk of infection, bleeding, anesthesia, damage to normal structures, and failure of surgery to accomplish its intended goals of relieving symptoms and restoring function.  With this in mind, he desires to proceed.  All questions have been encouraged and answered preoperatively.  OPERATIVE PROCEDURE:  The patient was seen by myself and Anesthesia, taken to operative theater and underwent smooth induction of general anesthesia.  The arm was evaluated.  His dressing was removed, the dressing looked stable.  He had  good beefy red tissue without evidence of any worsening.  At this time, we then very carefully and cautiously prepped the arm with Betadine scrub and paint.  Final time-out was called and pre and postop check was complete.  Once this was completed, the patient then underwent a very careful and cautious approach to the extremity with irrigation and debridement of skin, subcutaneous tissue, peritendinous tissues, tendon tissue, and muscle tissue.  This was performed with curette, knife, blade, and scissor tip.  We irrigated copiously.  Following this, I then performed irrigation, debridement, and a radical tenosynovectomy of the EDC to the index, middle, ring, and small fingers, as well as the EIP to the index finger and the EDM to the small finger.  This was radical tenosynovectomy that removed any nonviable tissue and pre- necrotic film tissue.  The patient tolerated this well.  Following this, I then placed 6 L of saline through and through the area.  Following this, I then placed a vacuum-assisted closure device about the area.  The patient tolerated this well.  This was hooked up to suction. There was no leak, all looked well.  I then dressed him with Adaptic, Xeroform, 4x4s, gauze, and associated volar plaster splint.  He tolerated the procedure well.  We will continue IV antibiotics, await his cultures.  We  have added a fungal culture, and we have also documented that he is tolerant of cephalosporins.  We will await the final cultures and move forward accordingly.  Our plan is to hopefully perform a primary closure, although I would not rule out the possibility of skin graft versus flap if necessary for coverage.  The patient is aware of this.  All questions have been encouraged and answered.  He tolerated the procedure well and was taken to recovery room in stable condition.     Satira Anis. Amedeo Plenty, M.D.     [facility]  D:  11/30/2015  T:  11/30/2015  Job:  [id number]

## 2015-11-30 NOTE — Anesthesia Procedure Notes (Signed)
Procedure Name: LMA Insertion Date/Time: 11/30/2015 8:01 AM Performed by: Tressia Miners LEFFEW Pre-anesthesia Checklist: Patient identified, Emergency Drugs available, Suction available, Timeout performed and Patient being monitored Patient Re-evaluated:Patient Re-evaluated prior to inductionOxygen Delivery Method: Circle system utilized Preoxygenation: Pre-oxygenation with 100% oxygen Intubation Type: IV induction Ventilation: Mask ventilation without difficulty LMA: LMA inserted LMA Size: 4.0 Number of attempts: 1 Tube secured with: Tape Dental Injury: Teeth and Oropharynx as per pre-operative assessment

## 2015-11-30 NOTE — Progress Notes (Signed)
Patient ID: Travis Callahan, male   DOB: 20-Feb-1991, 25 y.o.   MRN: [id number] Patient stable Aware of plans,ABX Tx, etc. We are planning surgery for your upper extremity. The risk and benefits of surgery to include risk of bleeding, infection, anesthesia,  damage to normal structures and failure of the surgery to accomplish its intended goals of relieving symptoms and restoring function have been discussed in detail. With this in mind we plan to proceed. I have specifically discussed with the patient the pre-and postoperative regime and the dos and don'ts and risk and benefits in great detail. Risk and benefits of surgery also include risk of dystrophy(CRPS), chronic nerve pain, failure of the healing process to go onto completion and other inherent risks of surgery The relavent the pathophysiology of the disease/injury process, as well as the alternatives for treatment and postoperative course of action has been discussed in great detail with the patient who desires to proceed.  We will do everything in our power to help you (the patient) restore function to the upper extremity. It is a pleasure to see this patient today. Santos Sollenberger MD

## 2015-11-30 NOTE — Anesthesia Preprocedure Evaluation (Addendum)
Anesthesia Evaluation  Patient identified by MRN, date of birth, ID band Patient awake    Reviewed: Allergy & Precautions, NPO status , Patient's Chart, lab work & pertinent test results  Airway Mallampati: I  TM Distance: >3 FB Neck ROM: Full    Dental  (+) Teeth Intact   Pulmonary Current Smoker,    breath sounds clear to auscultation       Cardiovascular negative cardio ROS   Rhythm:Regular Rate:Normal     Neuro/Psych negative neurological ROS  negative psych ROS   GI/Hepatic PUD, GERD  Medicated,(+) Hepatitis -, Toxin Related  Endo/Other  negative endocrine ROS  Renal/GU negative Renal ROS  negative genitourinary   Musculoskeletal negative musculoskeletal ROS (+)   Abdominal   Peds negative pediatric ROS (+)  Hematology   Anesthesia Other Findings Currently in 10/10 pain  Reproductive/Obstetrics negative OB ROS                           Lab Results  Component Value Date   WBC 7.0 11/27/2015   HGB 11.0* 11/27/2015   HCT 34.4* 11/27/2015   MCV 87.3 11/27/2015   PLT 375 11/27/2015   Lab Results  Component Value Date   INR 1.12 11/29/2015   INR 1.20 06/23/2010   INR 1.07 06/20/2010     Anesthesia Physical Anesthesia Plan  ASA: II  Anesthesia Plan: General   Post-op Pain Management:    Induction: Intravenous  Airway Management Planned: LMA  Additional Equipment:   Intra-op Plan:   Post-operative Plan: Extubation in OR  Informed Consent: I have reviewed the patients History and Physical, chart, labs and discussed the procedure including the risks, benefits and alternatives for the proposed anesthesia with the patient or authorized representative who has indicated his/her understanding and acceptance.   Dental advisory given  Plan Discussed with: CRNA  Anesthesia Plan Comments:         Anesthesia Quick Evaluation

## 2015-11-30 NOTE — Progress Notes (Signed)
Patient ID: Travis Callahan, male   DOB: 19-Sep-1991, 25 y.o.   MRN: [id number]         [facility]    Date of Admission:  11/27/2015           Day 3 vancomycin        Day 3 cefazolin        Day 3 fluconazole  Principal Problem:   Abscess of left hand Active Problems:   Chronic hepatitis C (HCC)   Normocytic anemia   Polysubstance abuse   Status post appendectomy   History of peritonitis   History of resection of large bowel   .  ceFAZolin (ANCEF) IV  2 g Intravenous Q8H  . Chlorhexidine Gluconate Cloth  6 each Topical Q0600  . docusate sodium  100 mg Oral BID  . fluconazole  400 mg Oral Q24H  . mupirocin ointment   Nasal BID  . senna  1 tablet Oral BID  . vancomycin  1,000 mg Intravenous Q8H  . vitamin C  1,000 mg Oral Daily    SUBJECTIVE: He is complaining of pain.  Review of Systems: Review of Systems  Constitutional: Negative for fever, chills and diaphoresis.  Respiratory: Negative for cough and shortness of breath.   Cardiovascular: Negative for chest pain.  Musculoskeletal: Positive for joint pain.  Psychiatric/Behavioral: Positive for substance abuse.    Past Medical History  Diagnosis Date  . MRSA (methicillin resistant Staphylococcus aureus)   . Peritonitis ([place])   . Gastric ulcer   . Stomach ulcer   . Crohn's   . Bowel obstruction ([place])   . Ulcer     Social History  Substance Use Topics  . Smoking status: Current Every Day Smoker -- 0.25 packs/day    Types: Cigarettes  . Smokeless tobacco: None  . Alcohol Use: No     Comment: socially    Family History  Problem Relation Age of Onset  . Heart failure Mother   . Congestive Heart Failure Mother    Allergies  Allergen Reactions  . Penicillins Other (See Comments)    HAS TOLERATED CEFAZOLIN WITHOUT PROBLEM Childhood reaction  . Nsaids Other (See Comments)    Ulcers   . Ivp Dye [Iodinated Diagnostic Agents] Other (See Comments)    Needs solumedrol and  benadryl prior  . Ketorolac Tromethamine Hives, Itching and Other (See Comments)    STOMACH ULCER  . Morphine And Related Hives and Itching  . Tramadol Nausea And Vomiting    Ulcer     OBJECTIVE: Filed Vitals:   11/30/15 0917 11/30/15 0924 11/30/15 0941 11/30/15 1249  BP:  164/79 163/88 146/85  Pulse:  72 74 80  Temp: 98.1 F (36.7 C)  97.8 F (36.6 C) 98.2 F (36.8 C)  TempSrc:      Resp:  9 14 16   Height:      Weight:      SpO2:  100% 100% 100%   Body mass index is 23.62 kg/(m^2).  Physical Exam  Constitutional: No distress.  HENT:  Mouth/Throat: No oropharyngeal exudate.  Eyes: Conjunctivae are normal.  Cardiovascular: Regular rhythm.   No murmur heard. Pulmonary/Chest: Breath sounds normal.  Abdominal: Soft. There is no tenderness.  Healed lower midline incision.  Musculoskeletal:  Left hand and bulky dressing.  Neurological: He is alert.  Skin: No rash noted.  Psychiatric: Affect normal.    Lab Results Lab Results  Component Value Date   WBC 7.0 11/27/2015   HGB  11.0* 11/27/2015   HCT 34.4* 11/27/2015   MCV 87.3 11/27/2015   PLT 375 11/27/2015    Lab Results  Component Value Date   CREATININE 0.77 11/27/2015   BUN 10 11/27/2015   NA 145 11/27/2015   K 3.3* 11/27/2015   CL 110 11/27/2015   CO2 25 11/27/2015    Lab Results  Component Value Date   ALT 20 11/27/2015   AST 21 11/27/2015   ALKPHOS 75 11/27/2015   BILITOT 0.8 11/27/2015     Microbiology: Recent Results (from the past 240 hour(s))  Culture, blood (routine x 2)     Status: None (Preliminary result)   Collection Time: 11/27/15  4:07 PM  Result Value Ref Range Status   Specimen Description BLOOD RIGHT HAND  Final   Special Requests BOTTLES DRAWN AEROBIC AND ANAEROBIC 4 ML  Final   Culture   Final    NO GROWTH 3 DAYS Performed at [facility]    Report Status PENDING  Incomplete  Culture, blood (routine x 2)     Status: None (Preliminary result)   Collection Time:  11/27/15  4:30 PM  Result Value Ref Range Status   Specimen Description BLOOD RIGHT HAND  Final   Special Requests BOTTLES DRAWN AEROBIC AND ANAEROBIC 5 CC  Final   Culture   Final    NO GROWTH 3 DAYS Performed at [facility]    Report Status PENDING  Incomplete  Wound culture     Status: None   Collection Time: 11/27/15  8:54 PM  Result Value Ref Range Status   Specimen Description WOUND LEFT HAND  Final   Special Requests Normal  Final   Gram Stain   Final    MODERATE WBC PRESENT,BOTH PMN AND MONONUCLEAR NO SQUAMOUS EPITHELIAL CELLS SEEN MODERATE GRAM POSITIVE COCCI IN PAIRS Performed at [facility]    Culture   Final    ABUNDANT METHICILLIN RESISTANT STAPHYLOCOCCUS AUREUS DUMFER 11/29/14 1100 BY SMITHERSJ Note: RIFAMPIN AND GENTAMICIN SHOULD NOT BE USED AS SINGLE DRUGS FOR TREATMENT OF STAPH INFECTIONS. CRITICAL RESULT CALLED TO, READ BACK BY AND VERIFIED WITH: KAREN LOWE Performed at [facility]    Report Status 11/30/2015 FINAL  Final   Organism ID, Bacteria METHICILLIN RESISTANT STAPHYLOCOCCUS AUREUS  Final      Susceptibility   Methicillin resistant staphylococcus aureus - MIC*    CLINDAMYCIN >=8 RESISTANT Resistant     ERYTHROMYCIN >=8 RESISTANT Resistant     GENTAMICIN <=0.5 SENSITIVE Sensitive     LEVOFLOXACIN >=8 RESISTANT Resistant     OXACILLIN >=4 RESISTANT Resistant     RIFAMPIN <=0.5 SENSITIVE Sensitive     TRIMETH/SULFA <=10 SENSITIVE Sensitive     VANCOMYCIN 1 SENSITIVE Sensitive     TETRACYCLINE <=1 SENSITIVE Sensitive     * ABUNDANT METHICILLIN RESISTANT STAPHYLOCOCCUS AUREUS  Anaerobic culture     Status: None (Preliminary result)   Collection Time: 11/27/15  8:54 PM  Result Value Ref Range Status   Specimen Description WOUND LEFT HAND  Final   Special Requests Normal  Final   Gram Stain   Final    NO WBC SEEN NO SQUAMOUS EPITHELIAL CELLS SEEN NO ORGANISMS SEEN Performed at [facility]    Culture   Final    NO  ANAEROBES ISOLATED; CULTURE IN PROGRESS FOR 5 DAYS Performed at [facility]    Report Status PENDING  Incomplete  Fungus Culture with Smear     Status: None (  Preliminary result)   Collection Time: 11/27/15  8:54 PM  Result Value Ref Range Status   Specimen Description HAND LEFT  Final   Special Requests NONE  Final   Fungal Smear   Final    NO YEAST OR FUNGAL ELEMENTS SEEN Performed at [facility]    Culture   Final    CULTURE IN PROGRESS FOR FOUR WEEKS Performed at [facility]    Report Status PENDING  Incomplete  MRSA PCR Screening     Status: None   Collection Time: 11/28/15 12:35 AM  Result Value Ref Range Status   MRSA by PCR NEGATIVE NEGATIVE Final    Comment:        The GeneXpert MRSA Assay (FDA approved for NASAL specimens only), is one component of a comprehensive MRSA colonization surveillance program. It is not intended to diagnose MRSA infection nor to guide or monitor treatment for MRSA infections.   Anaerobic culture     Status: None (Preliminary result)   Collection Time: 11/28/15 12:27 PM  Result Value Ref Range Status   Specimen Description WOUND LEFT HAND  Final   Special Requests NONE  Final   Gram Stain   Final    MODERATE WBC PRESENT,BOTH PMN AND MONONUCLEAR NO SQUAMOUS EPITHELIAL CELLS SEEN NO ORGANISMS SEEN Performed at [facility]    Culture   Final    NO ANAEROBES ISOLATED; CULTURE IN PROGRESS FOR 5 DAYS Performed at [facility]    Report Status PENDING  Incomplete  Wound culture     Status: None (Preliminary result)   Collection Time: 11/28/15 12:27 PM  Result Value Ref Range Status   Specimen Description WOUND LEFT HAND  Final   Special Requests NONE  Final   Gram Stain   Final    ABUNDANT WBC PRESENT,BOTH PMN AND MONONUCLEAR NO SQUAMOUS EPITHELIAL CELLS SEEN NO ORGANISMS SEEN Performed at [facility]    Culture   Final    MODERATE STAPHYLOCOCCUS AUREUS Note: RIFAMPIN AND  GENTAMICIN SHOULD NOT BE USED AS SINGLE DRUGS FOR TREATMENT OF STAPH INFECTIONS. Performed at [facility]    Report Status PENDING  Incomplete  Fungus Culture with Smear     Status: None (Preliminary result)   Collection Time: 11/28/15 12:27 PM  Result Value Ref Range Status   Specimen Description WOUND LEFT HAND  Final   Special Requests NONE  Final   Fungal Smear   Final    NO YEAST OR FUNGAL ELEMENTS SEEN Performed at [facility]    Culture   Final    CULTURE IN PROGRESS FOR FOUR WEEKS Performed at [facility]    Report Status PENDING  Incomplete  Anaerobic culture     Status: None (Preliminary result)   Collection Time: 11/28/15 12:51 PM  Result Value Ref Range Status   Specimen Description TISSUE LEFT HAND  Final   Special Requests POF VANCOMYCIN  Final   Gram Stain PENDING  Incomplete   Culture   Final    NO ANAEROBES ISOLATED; CULTURE IN PROGRESS FOR 5 DAYS Performed at [facility]    Report Status PENDING  Incomplete  Fungus Culture with Smear     Status: None (Preliminary result)   Collection Time: 11/28/15 12:51 PM  Result Value Ref Range Status   Specimen Description TISSUE LEFT HAND  Final   Special Requests POF VANCOMYCIN  Final   Fungal Smear   Final    NO YEAST  OR FUNGAL ELEMENTS SEEN Performed at [facility]    Culture   Final    CULTURE IN PROGRESS FOR FOUR WEEKS Performed at [facility]    Report Status PENDING  Incomplete  Tissue culture     Status: None (Preliminary result)   Collection Time: 11/28/15 12:51 PM  Result Value Ref Range Status   Specimen Description TISSUE LEFT HAND  Final   Special Requests POF VANCOMYCIN  Final   Gram Stain   Final    NO WBC SEEN NO SQUAMOUS EPITHELIAL CELLS SEEN NO ORGANISMS SEEN Performed at [facility]    Culture   Final    NO GROWTH 1 DAY Performed at [facility]    Report Status PENDING  Incomplete     ASSESSMENT: His left  hand cultures are growing MRSA. I will continue the vancomycin while he is here and consider a dose of long-acting oritavancin before discharge so he will not need to be discharged with IV.  PLAN: 1. Continue vancomycin 2. Discontinue cefazolin and fluconazole  Michel Bickers, MD [facility] [phone] pager   [phone] cell 11/30/2015, 4:01 PM

## 2015-11-30 NOTE — Progress Notes (Signed)
Pharmacy Antibiotic Follow-up Note  Travis Callahan is a 25 y.o. year-old male admitted on 11/27/2015.  The patient is currently on day 3 of vancomycin for wound infection.  Assessment/Plan: The dose of vancomycin will be adjusted to 1250mg  IV q8h based on renal function.  Temp (24hrs), Avg:98.3 F (36.8 C), Min:97.8 F (36.6 C), Max:99.2 F (37.3 C)   Recent Labs Lab 11/27/15 1609  WBC 7.0    Recent Labs Lab 11/27/15 1609  CREATININE 0.77   Estimated Creatinine Clearance: 141.2 mL/min (by C-G formula based on Cr of 0.77).    Allergies  Allergen Reactions  . Penicillins Other (See Comments)    HAS TOLERATED CEFAZOLIN WITHOUT PROBLEM Childhood reaction  . Nsaids Other (See Comments)    Ulcers   . Ivp Dye [Iodinated Diagnostic Agents] Other (See Comments)    Needs solumedrol and benadryl prior  . Ketorolac Tromethamine Hives, Itching and Other (See Comments)    STOMACH ULCER  . Morphine And Related Hives and Itching  . Tramadol Nausea And Vomiting    Ulcer     Antimicrobials this admission: Vanc 1/7>> Fluc 1/7>>1/9 Ancef 1/6>>1/9  Levels/dose changes this admission: 1/9 VT = 8mcg/mL *per OR and MAR documentation, a dose was missed this morning  Microbiology results: 1/6 BCx: NGTD 1/6 fungus smear left hand: IP 1/7 left wound: MRSA, no anaerobes  Thank you for allowing pharmacy to be a part of this patient's care.  Verna Czech PharmD 11/30/2015 5:19 PM

## 2015-11-30 NOTE — Op Note (Signed)
See UG:[id number] Travis Plenty MD

## 2015-12-01 ENCOUNTER — Encounter (HOSPITAL_COMMUNITY): Payer: Self-pay | Admitting: Orthopedic Surgery

## 2015-12-01 LAB — WOUND CULTURE

## 2015-12-01 LAB — HEPATITIS A ANTIBODY, TOTAL: HEP A TOTAL AB: NEGATIVE

## 2015-12-01 LAB — HEPATITIS B SURFACE ANTIGEN: Hepatitis B Surface Ag: NEGATIVE

## 2015-12-01 LAB — HEPATITIS C ANTIBODY: HCV Ab: >11 s/co ratio — ABNORMAL HIGH (ref 0.0–0.9)

## 2015-12-01 LAB — HEPATITIS B SURFACE ANTIBODY,QUALITATIVE: Hep B S Ab: REACTIVE

## 2015-12-01 MED ORDER — ACETAMINOPHEN 325 MG PO TABS
650.0000 mg | ORAL_TABLET | ORAL | Status: DC | PRN
  Administered 2015-12-01 – 2015-12-03 (×3): 650 mg via ORAL
  Filled 2015-12-01 (×3): qty 2

## 2015-12-01 NOTE — Progress Notes (Signed)
Patient ID: Travis Callahan, male   DOB: 1991-08-26, 24 y.o.   MRN: [id number] Patient is alert and oriented.  Patient has stable vitals.  Patient is going out methicillin-resistant staph A. He is on back in my some. I appreciate the help of infectious disease. Patient understands our plans.  I will plan for repeat irrigation and debridement tomorrow  I discussed with Rayburn Ma a stepwise approach to his hand. Certainly we would like to cover him primarily with stitching. His severe direct closure over drain. The second option would be a skin graft after suitable and bed is available. The last option would be a flap such as a radial forearm flap. We went through these issues at length and he understands the process.  Will allow wound conditions to dictate our plan tomorrow. I placed him on the operative schedule.  The patient is alert and oriented in no acute distress. The patient complains of pain in the affected upper extremity.  The patient is noted to have a normal HEENT exam. Lung fields show equal chest expansion and no shortness of breath. Abdomen exam is nontender without distention. Lower extremity examination does not show any fracture dislocation or blood clot symptoms. Pelvis is stable and the neck and back are stable and nontender.We have discussed with the patient the issues regarding their infection to the extremity. We will continue antibiotics and await culture results. Often times it will take 3-5 days for cultures to become final. During this time we will typically have the patient on intravenous antibiotics until we can find a parenteral route of antibiotic regime specific for the bacteria or organism isolated. We have discussed with the patient the need for daily irrigation and debridement as well as therapy to the area. We have discussed with the patient the necessity of range of motion to the involved joints as discussed today. We have discussed with the patient the  unpredictability of infections at times. We'll continue to work towards good pain control and restoration of function. The patient understands the need for meticulous wound care and the necessity of proper followup.  The possible complications of stiffness (loss of motion), resistant infection, possible deep bone infection, possible chronic pain issues, possible need for multiple surgeries and even amputation.  With this in mind the patient understands our goal is to eradicate the infection to quiesence. We will continue to work towards these goals.We are planning surgery for your upper extremity. The risk and benefits of surgery to include risk of bleeding, infection, anesthesia,  damage to normal structures and failure of the surgery to accomplish its intended goals of relieving symptoms and restoring function have been discussed in detail. With this in mind we plan to proceed. I have specifically discussed with the patient the pre-and postoperative regime and the dos and don'ts and risk and benefits in great detail. Risk and benefits of surgery also include risk of dystrophy(CRPS), chronic nerve pain, failure of the healing process to go onto completion and other inherent risks of surgery The relavent the pathophysiology of the disease/injury process, as well as the alternatives for treatment and postoperative course of action has been discussed in great detail with the patient who desires to proceed.  We will do everything in our power to help you (the patient) restore function to the upper extremity. It is a pleasure to see this patient today. Shamarie Call MD

## 2015-12-01 NOTE — Progress Notes (Signed)
Patient ID: Travis Callahan, male   DOB: Mar 22, 1991, 25 y.o.   MRN: [id number]         [facility] for Infectious Disease    Date of Admission:  11/27/2015           Day 4 vancomycin         Principal Problem:   Abscess of left hand Active Problems:   Chronic hepatitis C (HCC)   Normocytic anemia   Polysubstance abuse   Status post appendectomy   History of peritonitis   History of resection of large bowel   . Chlorhexidine Gluconate Cloth  6 each Topical Q0600  . docusate sodium  100 mg Oral BID  . mupirocin ointment   Nasal BID  . senna  1 tablet Oral BID  . vancomycin  1,250 mg Intravenous Q8H  . vitamin C  1,000 mg Oral Daily    SUBJECTIVE: He states that his left hand pain as 8 out of 10.  Review of Systems: Review of Systems  Constitutional: Negative for fever, chills and diaphoresis.  Respiratory: Negative for cough and shortness of breath.   Cardiovascular: Negative for chest pain.  Musculoskeletal: Positive for joint pain.  Psychiatric/Behavioral: Positive for substance abuse.    Past Medical History  Diagnosis Date  . MRSA (methicillin resistant Staphylococcus aureus)   . Peritonitis ([place])   . Gastric ulcer   . Stomach ulcer   . Crohn's   . Bowel obstruction ([place])   . Ulcer     Social History  Substance Use Topics  . Smoking status: Current Every Day Smoker -- 0.25 packs/day    Types: Cigarettes  . Smokeless tobacco: None  . Alcohol Use: No     Comment: socially    Family History  Problem Relation Age of Onset  . Heart failure Mother   . Congestive Heart Failure Mother    Allergies  Allergen Reactions  . Penicillins Other (See Comments)    HAS TOLERATED CEFAZOLIN WITHOUT PROBLEM Childhood reaction  . Nsaids Other (See Comments)    Ulcers   . Ivp Dye [Iodinated Diagnostic Agents] Other (See Comments)    Needs solumedrol and benadryl prior  . Ketorolac Tromethamine Hives, Itching and Other (See Comments)    STOMACH ULCER  .  Morphine And Related Hives and Itching  . Tramadol Nausea And Vomiting    Ulcer     OBJECTIVE: Filed Vitals:   11/30/15 2005 12/01/15 0200 12/01/15 0717 12/01/15 1257  BP: 155/75 103/67 124/55 155/72  Pulse: 76 75 76 70  Temp: 98.9 F (37.2 C) 98.8 F (37.1 C) 98.8 F (37.1 C) 99.1 F (37.3 C)  TempSrc: Oral     Resp: 14 18 19 16   Height:      Weight:      SpO2: 100% 100% 100% 99%   Body mass index is 23.62 kg/(m^2).  Physical Exam  Constitutional: No distress.  He is drowsy.  HENT:  Mouth/Throat: No oropharyngeal exudate.  Eyes: Conjunctivae are normal.  Cardiovascular: Regular rhythm.   No murmur heard. Pulmonary/Chest: Breath sounds normal.  Abdominal: Soft. There is no tenderness.  Healed lower midline incision.  Musculoskeletal:  Left hand and bulky wrap over VAC wound dressing.  Neurological: He is alert.  Skin: No rash noted.  Psychiatric: Affect normal.    Lab Results Lab Results  Component Value Date   WBC 7.0 11/27/2015   HGB 11.0* 11/27/2015   HCT 34.4* 11/27/2015   MCV 87.3 11/27/2015  PLT 375 11/27/2015    Lab Results  Component Value Date   CREATININE 1.29* 11/30/2015   BUN 8 11/30/2015   NA 135 11/30/2015   K 4.2 11/30/2015   CL 97* 11/30/2015   CO2 28 11/30/2015    Lab Results  Component Value Date   ALT 20 11/27/2015   AST 21 11/27/2015   ALKPHOS 75 11/27/2015   BILITOT 0.8 11/27/2015     Microbiology: Recent Results (from the past 240 hour(s))  Culture, blood (routine x 2)     Status: None (Preliminary result)   Collection Time: 11/27/15  4:07 PM  Result Value Ref Range Status   Specimen Description BLOOD RIGHT HAND  Final   Special Requests BOTTLES DRAWN AEROBIC AND ANAEROBIC 4 ML  Final   Culture   Final    NO GROWTH 4 DAYS Performed at [facility]    Report Status PENDING  Incomplete  Culture, blood (routine x 2)     Status: None (Preliminary result)   Collection Time: 11/27/15  4:30 PM  Result Value Ref  Range Status   Specimen Description BLOOD RIGHT HAND  Final   Special Requests BOTTLES DRAWN AEROBIC AND ANAEROBIC 5 CC  Final   Culture   Final    NO GROWTH 4 DAYS Performed at [facility]    Report Status PENDING  Incomplete  Wound culture     Status: None   Collection Time: 11/27/15  8:54 PM  Result Value Ref Range Status   Specimen Description WOUND LEFT HAND  Final   Special Requests Normal  Final   Gram Stain   Final    MODERATE WBC PRESENT,BOTH PMN AND MONONUCLEAR NO SQUAMOUS EPITHELIAL CELLS SEEN MODERATE GRAM POSITIVE COCCI IN PAIRS Performed at [facility]    Culture   Final    ABUNDANT METHICILLIN RESISTANT STAPHYLOCOCCUS AUREUS DUMFER 11/29/14 1100 BY SMITHERSJ Note: RIFAMPIN AND GENTAMICIN SHOULD NOT BE USED AS SINGLE DRUGS FOR TREATMENT OF STAPH INFECTIONS. CRITICAL RESULT CALLED TO, READ BACK BY AND VERIFIED WITH: KAREN LOWE Performed at [facility]    Report Status 11/30/2015 FINAL  Final   Organism ID, Bacteria METHICILLIN RESISTANT STAPHYLOCOCCUS AUREUS  Final      Susceptibility   Methicillin resistant staphylococcus aureus - MIC*    CLINDAMYCIN >=8 RESISTANT Resistant     ERYTHROMYCIN >=8 RESISTANT Resistant     GENTAMICIN <=0.5 SENSITIVE Sensitive     LEVOFLOXACIN >=8 RESISTANT Resistant     OXACILLIN >=4 RESISTANT Resistant     RIFAMPIN <=0.5 SENSITIVE Sensitive     TRIMETH/SULFA <=10 SENSITIVE Sensitive     VANCOMYCIN 1 SENSITIVE Sensitive     TETRACYCLINE <=1 SENSITIVE Sensitive     * ABUNDANT METHICILLIN RESISTANT STAPHYLOCOCCUS AUREUS  Anaerobic culture     Status: None (Preliminary result)   Collection Time: 11/27/15  8:54 PM  Result Value Ref Range Status   Specimen Description WOUND LEFT HAND  Final   Special Requests Normal  Final   Gram Stain   Final    NO WBC SEEN NO SQUAMOUS EPITHELIAL CELLS SEEN NO ORGANISMS SEEN Performed at [facility]    Culture   Final    NO ANAEROBES ISOLATED; CULTURE IN PROGRESS  FOR 5 DAYS Performed at [facility]    Report Status PENDING  Incomplete  Fungus Culture with Smear     Status: None (Preliminary result)   Collection Time: 11/27/15  8:54 PM  Result Value Ref  Range Status   Specimen Description HAND LEFT  Final   Special Requests NONE  Final   Fungal Smear   Final    NO YEAST OR FUNGAL ELEMENTS SEEN Performed at [facility]    Culture   Final    CULTURE IN PROGRESS FOR FOUR WEEKS Performed at [facility]    Report Status PENDING  Incomplete  MRSA PCR Screening     Status: None   Collection Time: 11/28/15 12:35 AM  Result Value Ref Range Status   MRSA by PCR NEGATIVE NEGATIVE Final    Comment:        The GeneXpert MRSA Assay (FDA approved for NASAL specimens only), is one component of a comprehensive MRSA colonization surveillance program. It is not intended to diagnose MRSA infection nor to guide or monitor treatment for MRSA infections.   Anaerobic culture     Status: None (Preliminary result)   Collection Time: 11/28/15 12:27 PM  Result Value Ref Range Status   Specimen Description WOUND LEFT HAND  Final   Special Requests NONE  Final   Gram Stain   Final    MODERATE WBC PRESENT,BOTH PMN AND MONONUCLEAR NO SQUAMOUS EPITHELIAL CELLS SEEN NO ORGANISMS SEEN Performed at [facility]    Culture   Final    NO ANAEROBES ISOLATED; CULTURE IN PROGRESS FOR 5 DAYS Performed at [facility]    Report Status PENDING  Incomplete  Wound culture     Status: None   Collection Time: 11/28/15 12:27 PM  Result Value Ref Range Status   Specimen Description WOUND LEFT HAND  Final   Special Requests NONE  Final   Gram Stain   Final    ABUNDANT WBC PRESENT,BOTH PMN AND MONONUCLEAR NO SQUAMOUS EPITHELIAL CELLS SEEN NO ORGANISMS SEEN Performed at [facility]    Culture   Final    MODERATE METHICILLIN RESISTANT STAPHYLOCOCCUS AUREUS Note: RIFAMPIN AND GENTAMICIN SHOULD NOT BE USED AS SINGLE  DRUGS FOR TREATMENT OF STAPH INFECTIONS. CRITICAL RESULT CALLED TO, READ BACK BY AND VERIFIED WITH: ADRIENNE RN ON 5N AT 0820 [id number] BY CASTC Performed at [facility]    Report Status 12/01/2015 FINAL  Final   Organism ID, Bacteria METHICILLIN RESISTANT STAPHYLOCOCCUS AUREUS  Final      Susceptibility   Methicillin resistant staphylococcus aureus - MIC*    CLINDAMYCIN >=8 RESISTANT Resistant     ERYTHROMYCIN >=8 RESISTANT Resistant     GENTAMICIN <=0.5 SENSITIVE Sensitive     LEVOFLOXACIN >=8 RESISTANT Resistant     OXACILLIN >=4 RESISTANT Resistant     RIFAMPIN <=0.5 SENSITIVE Sensitive     TRIMETH/SULFA <=10 SENSITIVE Sensitive     VANCOMYCIN 1 SENSITIVE Sensitive     TETRACYCLINE <=1 SENSITIVE Sensitive     * MODERATE METHICILLIN RESISTANT STAPHYLOCOCCUS AUREUS  Fungus Culture with Smear     Status: None (Preliminary result)   Collection Time: 11/28/15 12:27 PM  Result Value Ref Range Status   Specimen Description WOUND LEFT HAND  Final   Special Requests NONE  Final   Fungal Smear   Final    NO YEAST OR FUNGAL ELEMENTS SEEN Performed at [facility]    Culture   Final    CULTURE IN PROGRESS FOR FOUR WEEKS Performed at [facility]    Report Status PENDING  Incomplete  Anaerobic culture     Status: None (Preliminary result)   Collection Time: 11/28/15 12:51 PM  Result Value Ref  Range Status   Specimen Description TISSUE LEFT HAND  Final   Special Requests POF VANCOMYCIN  Final   Gram Stain PENDING  Incomplete   Culture   Final    NO ANAEROBES ISOLATED; CULTURE IN PROGRESS FOR 5 DAYS Performed at [facility]    Report Status PENDING  Incomplete  Fungus Culture with Smear     Status: None (Preliminary result)   Collection Time: 11/28/15 12:51 PM  Result Value Ref Range Status   Specimen Description TISSUE LEFT HAND  Final   Special Requests POF VANCOMYCIN  Final   Fungal Smear   Final    NO YEAST OR FUNGAL ELEMENTS SEEN Performed  at [facility]    Culture   Final    CULTURE IN PROGRESS FOR FOUR WEEKS Performed at [facility]    Report Status PENDING  Incomplete  Tissue culture     Status: None (Preliminary result)   Collection Time: 11/28/15 12:51 PM  Result Value Ref Range Status   Specimen Description TISSUE LEFT HAND  Final   Special Requests POF VANCOMYCIN  Final   Gram Stain   Final    NO WBC SEEN NO SQUAMOUS EPITHELIAL CELLS SEEN NO ORGANISMS SEEN Performed at [facility]    Culture   Final    NO GROWTH 2 DAYS Performed at [facility]    Report Status PENDING  Incomplete     ASSESSMENT: I will continue vancomycin. I plan is to give him 1 dose of long-acting oritavancin before discharge and then see him back in my clinic.  PLAN: 1. Continue vancomycin  Michel Bickers, MD [facility] for [facility] [place] [facility] [phone] pager   [phone] cell 12/01/2015, 3:06 PM

## 2015-12-02 LAB — CULTURE, BLOOD (ROUTINE X 2)
CULTURE: NO GROWTH
CULTURE: NO GROWTH

## 2015-12-02 MED FILL — Hydromorphone HCl Inj 2 MG/ML: INTRAMUSCULAR | Qty: 1 | Status: AC

## 2015-12-02 NOTE — Progress Notes (Signed)
Dr Amedeo Plenty office called Travis Callahan surgery has cancelled for today we are to order him a regular diet and he can have breakfest prior to 7am tomorrow surgery is scheduled for 12/03/15 after 5:30 PM.  Travis Callahan also wanted his dil 1mg  changed to 2mg  and Dr stated no. Meals called in for pt. Audie Box, RN

## 2015-12-02 NOTE — Progress Notes (Signed)
Subjective: 2 Days Post-Op Procedure(s) (LRB): IRRIGATION AND DEBRIDEMENT OF LEFT HAND (Left) APPLICATION OF WOUND VAC (Left) Patient without significant changes/complaints    Objective: Vital signs in last 24 hours: Temp:  [98.4 F (36.9 C)-100.3 F (37.9 C)] 99.4 F (37.4 C) (01/11 1544) Pulse Rate:  [74-99] 92 (01/11 1544) Resp:  [16-18] 18 (01/11 1544) BP: (137-179)/(63-74) 179/74 mmHg (01/11 1544) SpO2:  [98 %-99 %] 99 % (01/11 1544)  Intake/Output from previous day: 01/10 0701 - 01/11 0700 In: 480 [P.O.:480] Out: 900 [Urine:900] Intake/Output this shift:    No results for input(s): HGB in the last 72 hours. No results for input(s): WBC, RBC, HCT, PLT in the last 72 hours.  Recent Labs  11/30/15 1816  NA 135  K 4.2  CL 97*  CO2 28  BUN 8  CREATININE 1.29*  GLUCOSE 103*  CALCIUM 8.9    Recent Labs  11/29/15 1824  INR 1.12      Assessment/Plan: 2 Days Post-Op Procedure(s) (LRB): IRRIGATION AND DEBRIDEMENT OF LEFT HAND (Left) APPLICATION OF WOUND VAC (Left) We have discussed with the patient the need to return to the OR tomorrow for repeat wash out/ I& D, repair as necessary. He will need to be npo after 7am in the morning. All questions were encouraged and answered.  Dezyre Hoefer L 12/02/2015, 3:54 PM

## 2015-12-03 ENCOUNTER — Inpatient Hospital Stay (HOSPITAL_COMMUNITY): Payer: Self-pay | Admitting: Certified Registered Nurse Anesthetist

## 2015-12-03 ENCOUNTER — Encounter (HOSPITAL_COMMUNITY)
Admission: EM | Disposition: A | Discharge status: Home / Self Care | Payer: Self-pay | Source: Home / Self Care | Attending: Orthopedic Surgery

## 2015-12-03 ENCOUNTER — Encounter (HOSPITAL_COMMUNITY): Payer: Self-pay | Admitting: *Deleted

## 2015-12-03 HISTORY — PX: I & D EXTREMITY: SHX5045

## 2015-12-03 LAB — HEPATITIS C GENOTYPE

## 2015-12-03 LAB — ANAEROBIC CULTURE
GRAM STAIN: NONE SEEN
Special Requests: NORMAL

## 2015-12-03 LAB — HCV RNA QUANT RFLX ULTRA OR GENOTYP
HCV RNA Qnt(log copy/mL): 5.736 log10 IU/mL
HEPATITIS C QUANTITATION: 545000 [IU]/mL

## 2015-12-03 SURGERY — IRRIGATION AND DEBRIDEMENT EXTREMITY
Anesthesia: General | Site: Hand | Laterality: Left

## 2015-12-03 MED ORDER — ALPRAZOLAM 0.5 MG PO TABS
0.5000 mg | ORAL_TABLET | Freq: Four times a day (QID) | ORAL | Status: DC | PRN
  Administered 2015-12-03 – 2015-12-05 (×5): 0.5 mg via ORAL
  Filled 2015-12-03 (×6): qty 1

## 2015-12-03 MED ORDER — ONDANSETRON HCL 4 MG/2ML IJ SOLN
INTRAMUSCULAR | Status: AC
  Filled 2015-12-03: qty 2

## 2015-12-03 MED ORDER — SODIUM CHLORIDE 0.9 % IR SOLN
Status: DC | PRN
  Administered 2015-12-03: 1000 mL

## 2015-12-03 MED ORDER — DOCUSATE SODIUM 100 MG PO CAPS
100.0000 mg | ORAL_CAPSULE | Freq: Two times a day (BID) | ORAL | Status: DC
  Administered 2015-12-03 – 2015-12-05 (×4): 100 mg via ORAL
  Filled 2015-12-03 (×4): qty 1

## 2015-12-03 MED ORDER — PROPOFOL 10 MG/ML IV BOLUS
INTRAVENOUS | Status: AC
  Filled 2015-12-03: qty 40

## 2015-12-03 MED ORDER — PROPOFOL 10 MG/ML IV BOLUS
INTRAVENOUS | Status: DC | PRN
  Administered 2015-12-03: 200 mg via INTRAVENOUS

## 2015-12-03 MED ORDER — BUPIVACAINE HCL (PF) 0.25 % IJ SOLN
INTRAMUSCULAR | Status: AC
  Filled 2015-12-03: qty 30

## 2015-12-03 MED ORDER — HYDROMORPHONE HCL 1 MG/ML IJ SOLN
0.2500 mg | INTRAMUSCULAR | Status: DC | PRN
  Administered 2015-12-03 (×4): 0.5 mg via INTRAVENOUS

## 2015-12-03 MED ORDER — POTASSIUM CHLORIDE IN NACL 20-0.45 MEQ/L-% IV SOLN
INTRAVENOUS | Status: DC
  Administered 2015-12-03 – 2015-12-05 (×3): via INTRAVENOUS
  Filled 2015-12-03 (×4): qty 1000

## 2015-12-03 MED ORDER — MAGNESIUM CITRATE PO SOLN: 1.0000 | Freq: Once | ORAL | Status: DC | PRN

## 2015-12-03 MED ORDER — BISACODYL 10 MG RE SUPP: 10.0000 mg | Freq: Every day | RECTAL | Status: DC | PRN

## 2015-12-03 MED ORDER — LACTATED RINGERS IV SOLN: INTRAVENOUS | Status: DC

## 2015-12-03 MED ORDER — LIDOCAINE HCL (CARDIAC) 20 MG/ML IV SOLN
INTRAVENOUS | Status: AC
  Filled 2015-12-03: qty 5

## 2015-12-03 MED ORDER — FENTANYL CITRATE (PF) 100 MCG/2ML IJ SOLN
INTRAMUSCULAR | Status: DC | PRN
  Administered 2015-12-03: 100 ug via INTRAVENOUS
  Administered 2015-12-03: 50 ug via INTRAVENOUS
  Administered 2015-12-03: 100 ug via INTRAVENOUS

## 2015-12-03 MED ORDER — MIDAZOLAM HCL 2 MG/2ML IJ SOLN
INTRAMUSCULAR | Status: AC
  Filled 2015-12-03: qty 2

## 2015-12-03 MED ORDER — POLYETHYLENE GLYCOL 3350 17 G PO PACK: 17.0000 g | PACK | Freq: Every day | ORAL | Status: DC | PRN

## 2015-12-03 MED ORDER — ONDANSETRON HCL 4 MG/2ML IJ SOLN: 4.0000 mg | Freq: Four times a day (QID) | INTRAMUSCULAR | Status: DC | PRN

## 2015-12-03 MED ORDER — METHOCARBAMOL 500 MG PO TABS
500.0000 mg | ORAL_TABLET | Freq: Four times a day (QID) | ORAL | Status: DC | PRN
  Administered 2015-12-03 – 2015-12-04 (×4): 500 mg via ORAL
  Filled 2015-12-03 (×4): qty 1

## 2015-12-03 MED ORDER — ONDANSETRON HCL 4 MG PO TABS: 4.0000 mg | ORAL_TABLET | Freq: Four times a day (QID) | ORAL | Status: DC | PRN

## 2015-12-03 MED ORDER — FAMOTIDINE 20 MG PO TABS: 20.0000 mg | ORAL_TABLET | Freq: Two times a day (BID) | ORAL | Status: DC | PRN

## 2015-12-03 MED ORDER — HYDROMORPHONE HCL 1 MG/ML IJ SOLN
INTRAMUSCULAR | Status: AC
  Administered 2015-12-03: 0.5 mg via INTRAVENOUS
  Filled 2015-12-03: qty 2

## 2015-12-03 MED ORDER — VITAMIN C 500 MG PO TABS
1000.0000 mg | ORAL_TABLET | Freq: Every day | ORAL | Status: DC
  Administered 2015-12-03 – 2015-12-05 (×3): 1000 mg via ORAL
  Filled 2015-12-03 (×3): qty 2

## 2015-12-03 MED ORDER — LIDOCAINE HCL (CARDIAC) 20 MG/ML IV SOLN
INTRAVENOUS | Status: DC | PRN
  Administered 2015-12-03: 75 mg via INTRAVENOUS

## 2015-12-03 MED ORDER — DIPHENHYDRAMINE HCL 25 MG PO CAPS: 25.0000 mg | ORAL_CAPSULE | Freq: Four times a day (QID) | ORAL | Status: DC | PRN

## 2015-12-03 MED ORDER — BUPIVACAINE HCL (PF) 0.25 % IJ SOLN
INTRAMUSCULAR | Status: DC | PRN
  Administered 2015-12-03: 30 mL

## 2015-12-03 MED ORDER — LACTATED RINGERS IV SOLN
INTRAVENOUS | Status: DC | PRN
  Administered 2015-12-03: 18:00:00 via INTRAVENOUS

## 2015-12-03 MED ORDER — MIDAZOLAM HCL 5 MG/5ML IJ SOLN
INTRAMUSCULAR | Status: DC | PRN
  Administered 2015-12-03: 2 mg via INTRAVENOUS

## 2015-12-03 MED ORDER — METHOCARBAMOL 1000 MG/10ML IJ SOLN: 500.0000 mg | Freq: Four times a day (QID) | INTRAVENOUS | Status: DC | PRN

## 2015-12-03 MED ORDER — FENTANYL CITRATE (PF) 250 MCG/5ML IJ SOLN
INTRAMUSCULAR | Status: AC
  Filled 2015-12-03: qty 5

## 2015-12-03 MED ORDER — SUCCINYLCHOLINE CHLORIDE 20 MG/ML IJ SOLN
INTRAMUSCULAR | Status: AC
  Filled 2015-12-03: qty 1

## 2015-12-03 MED ORDER — PROMETHAZINE HCL 25 MG RE SUPP: 12.5000 mg | Freq: Four times a day (QID) | RECTAL | Status: DC | PRN

## 2015-12-03 MED ORDER — ONDANSETRON HCL 4 MG/2ML IJ SOLN
INTRAMUSCULAR | Status: DC | PRN
  Administered 2015-12-03: 4 mg via INTRAVENOUS

## 2015-12-03 MED ORDER — SENNA 8.6 MG PO TABS
1.0000 | ORAL_TABLET | Freq: Two times a day (BID) | ORAL | Status: DC
  Administered 2015-12-03 – 2015-12-05 (×4): 8.6 mg via ORAL
  Filled 2015-12-03 (×4): qty 1

## 2015-12-03 SURGICAL SUPPLY — 41 items
BANDAGE ELASTIC 4 VELCRO ST LF (GAUZE/BANDAGES/DRESSINGS) ×2 IMPLANT
BNDG CONFORM 2 STRL LF (GAUZE/BANDAGES/DRESSINGS) IMPLANT
BNDG GAUZE ELAST 4 BULKY (GAUZE/BANDAGES/DRESSINGS) ×2 IMPLANT
CORDS BIPOLAR (ELECTRODE) ×2 IMPLANT
CUFF TOURNIQUET SINGLE 18IN (TOURNIQUET CUFF) ×2 IMPLANT
CUFF TOURNIQUET SINGLE 24IN (TOURNIQUET CUFF) IMPLANT
DRSG ADAPTIC 3X8 NADH LF (GAUZE/BANDAGES/DRESSINGS) ×2 IMPLANT
DRSG MEPITEL 4X7.2 (GAUZE/BANDAGES/DRESSINGS) ×2 IMPLANT
GAUZE SPONGE 4X4 12PLY STRL (GAUZE/BANDAGES/DRESSINGS) ×2 IMPLANT
GAUZE XEROFORM 1X8 LF (GAUZE/BANDAGES/DRESSINGS) ×2 IMPLANT
GAUZE XEROFORM 5X9 LF (GAUZE/BANDAGES/DRESSINGS) ×2 IMPLANT
GLOVE BIOGEL M STRL SZ7.5 (GLOVE) ×2 IMPLANT
GLOVE SS BIOGEL STRL SZ 8 (GLOVE) ×1 IMPLANT
GLOVE SUPERSENSE BIOGEL SZ 8 (GLOVE) ×1
GOWN STRL REUS W/ TWL LRG LVL3 (GOWN DISPOSABLE) ×1 IMPLANT
GOWN STRL REUS W/ TWL XL LVL3 (GOWN DISPOSABLE) ×2 IMPLANT
GOWN STRL REUS W/TWL LRG LVL3 (GOWN DISPOSABLE) ×1
GOWN STRL REUS W/TWL XL LVL3 (GOWN DISPOSABLE) ×2
HANDPIECE INTERPULSE COAX TIP (DISPOSABLE)
KIT BASIN OR (CUSTOM PROCEDURE TRAY) ×2 IMPLANT
KIT ROOM TURNOVER OR (KITS) ×2 IMPLANT
MANIFOLD NEPTUNE II (INSTRUMENTS) ×2 IMPLANT
NEEDLE HYPO 25GX1X1/2 BEV (NEEDLE) IMPLANT
NS IRRIG 1000ML POUR BTL (IV SOLUTION) ×2 IMPLANT
PACK ORTHO EXTREMITY (CUSTOM PROCEDURE TRAY) ×2 IMPLANT
PAD ARMBOARD 7.5X6 YLW CONV (MISCELLANEOUS) ×2 IMPLANT
PAD CAST 3X4 CTTN HI CHSV (CAST SUPPLIES) ×1 IMPLANT
PAD CAST 4YDX4 CTTN HI CHSV (CAST SUPPLIES) ×1 IMPLANT
PADDING CAST COTTON 3X4 STRL (CAST SUPPLIES) ×1
PADDING CAST COTTON 4X4 STRL (CAST SUPPLIES) ×1
SET HNDPC FAN SPRY TIP SCT (DISPOSABLE) IMPLANT
SPLINT FIBERGLASS 3X12 (CAST SUPPLIES) ×2 IMPLANT
SPONGE LAP 4X18 X RAY DECT (DISPOSABLE) ×2 IMPLANT
SUT PROLENE 2 0 SH 30 (SUTURE) ×2 IMPLANT
SYR CONTROL 10ML LL (SYRINGE) IMPLANT
TOWEL OR 17X24 6PK STRL BLUE (TOWEL DISPOSABLE) ×2 IMPLANT
TOWEL OR 17X26 10 PK STRL BLUE (TOWEL DISPOSABLE) ×2 IMPLANT
TUBE ANAEROBIC SPECIMEN COL (MISCELLANEOUS) IMPLANT
TUBE CONNECTING 12X1/4 (SUCTIONS) ×2 IMPLANT
WATER STERILE IRR 1000ML POUR (IV SOLUTION) ×2 IMPLANT
YANKAUER SUCT BULB TIP NO VENT (SUCTIONS) ×2 IMPLANT

## 2015-12-03 NOTE — Anesthesia Preprocedure Evaluation (Signed)
Anesthesia Evaluation  Patient identified by MRN, date of birth, ID band Patient awake    Reviewed: Allergy & Precautions, H&P , NPO status , Patient's Chart, lab work & pertinent test results  Airway Mallampati: II  TM Distance: >3 FB Neck ROM: Full    Dental no notable dental hx. (+) Teeth Intact, Dental Advisory Given   Pulmonary Current Smoker,    Pulmonary exam normal breath sounds clear to auscultation       Cardiovascular negative cardio ROS   Rhythm:Regular Rate:Normal     Neuro/Psych negative neurological ROS  negative psych ROS   GI/Hepatic PUD, GERD  Medicated,(+)     substance abuse  marijuana use, Hepatitis -  Endo/Other  negative endocrine ROS  Renal/GU negative Renal ROS  negative genitourinary   Musculoskeletal   Abdominal   Peds  Hematology negative hematology ROS (+)   Anesthesia Other Findings   Reproductive/Obstetrics negative OB ROS                             Anesthesia Physical Anesthesia Plan  ASA: II  Anesthesia Plan: General   Post-op Pain Management:    Induction: Intravenous  Airway Management Planned: LMA  Additional Equipment:   Intra-op Plan:   Post-operative Plan: Extubation in OR  Informed Consent: I have reviewed the patients History and Physical, chart, labs and discussed the procedure including the risks, benefits and alternatives for the proposed anesthesia with the patient or authorized representative who has indicated his/her understanding and acceptance.   Dental advisory given  Plan Discussed with: CRNA  Anesthesia Plan Comments:         Anesthesia Quick Evaluation

## 2015-12-03 NOTE — Anesthesia Procedure Notes (Signed)
Procedure Name: LMA Insertion Date/Time: 12/03/2015 6:25 PM Performed by: Katina Remick S Pre-anesthesia Checklist: Patient identified, Timeout performed, Emergency Drugs available, Suction available and Patient being monitored Patient Re-evaluated:Patient Re-evaluated prior to inductionOxygen Delivery Method: Circle system utilized Preoxygenation: Pre-oxygenation with 100% oxygen Intubation Type: IV induction Ventilation: Mask ventilation without difficulty LMA: LMA inserted LMA Size: 5.0 Number of attempts: 1 Placement Confirmation: ETT inserted through vocal cords under direct vision Tube secured with: Tape Dental Injury: Teeth and Oropharynx as per pre-operative assessment

## 2015-12-03 NOTE — H&P (Signed)
  Patient seen and evaluated Ready to proceed with repeat irrigation debridement. Patient understands we'll plan for possible closure versus Integra placement  All questions have been encouraged and answered. I appreciate infectious disease recommendations and help with this complex patient  Jadaya Sommerfield M.D.

## 2015-12-03 NOTE — Progress Notes (Signed)
Patient ID: SHOMARI MCGLONE, male   DOB: 1991/08/11, 25 y.o.   MRN: [id number]         [facility] for Infectious Disease    Date of Admission:  11/18/2015           Day 6 vancomycin         He is scheduled for surgery later today. I will continue my plan of treating him with vancomycin until he is ready for discharge and then give a dose of long-acting oritavancin.  Michel Bickers, MD [facility] for [facility] [place] [facility] [phone] pager   [phone] cell 11/24/2015, 1:32 PM

## 2015-12-03 NOTE — Op Note (Signed)
See dictation#[id number] Amedeo Plenty MD

## 2015-12-03 NOTE — Progress Notes (Signed)
Called short stay gave report

## 2015-12-03 NOTE — Transfer of Care (Signed)
Immediate Anesthesia Transfer of Care Note  Patient: Travis Callahan  Procedure(s) Performed: Procedure(s): IRRIGATION AND DEBRIDEMENT EXTREMITY (Left)  Patient Location: PACU  Anesthesia Type:General  Level of Consciousness: awake, alert  and oriented  Airway & Oxygen Therapy: Patient Spontanous Breathing and Patient connected to face mask oxygen  Post-op Assessment: Report given to RN and Post -op Vital signs reviewed and stable  Post vital signs: Reviewed and stable  Last Vitals:  Filed Vitals:   12/03/15 0617 12/03/15 1415  BP: 161/69 148/70  Pulse: 78 75  Temp: 37 C 36.9 C  Resp: 18 18    Complications: No apparent anesthesia complications

## 2015-12-04 ENCOUNTER — Encounter (HOSPITAL_COMMUNITY): Payer: Self-pay | Admitting: Orthopedic Surgery

## 2015-12-04 LAB — BASIC METABOLIC PANEL
Anion gap: 8 (ref 5–15)
CHLORIDE: 100 mmol/L — AB (ref 101–111)
CO2: 28 mmol/L (ref 22–32)
Calcium: 9 mg/dL (ref 8.9–10.3)
Creatinine, Ser: 0.74 mg/dL (ref 0.61–1.24)
GFR calc Af Amer: >60 mL/min (ref >60)
GFR calc non Af Amer: >60 mL/min (ref >60)
Glucose, Bld: 111 mg/dL — ABNORMAL HIGH (ref 65–99)
POTASSIUM: 4.3 mmol/L (ref 3.5–5.1)
SODIUM: 136 mmol/L (ref 135–145)

## 2015-12-04 LAB — TISSUE CULTURE: GRAM STAIN: NONE SEEN

## 2015-12-04 LAB — VANCOMYCIN, TROUGH: Vancomycin Tr: 29 ug/mL (ref 10.0–20.0)

## 2015-12-04 MED ORDER — ORITAVANCIN DIPHOSPHATE 400 MG IV SOLR
1200.0000 mg | Freq: Once | INTRAVENOUS | Status: AC
  Administered 2015-12-04: 1200 mg via INTRAVENOUS
  Filled 2015-12-04: qty 120

## 2015-12-04 MED ORDER — VANCOMYCIN HCL IN DEXTROSE 1-5 GM/200ML-% IV SOLN
1000.0000 mg | Freq: Three times a day (TID) | INTRAVENOUS | Status: DC
  Filled 2015-12-04 (×2): qty 200

## 2015-12-04 NOTE — Op Note (Signed)
NAMERODD, POLAN NO.:  [id number]  MEDICAL RECORD NO.:  [id number]  LOCATION:                                 FACILITY:  PHYSICIAN:  Satira Anis. Qunicy Higinbotham, M.D.DATE OF BIRTH:  1991/10/11  DATE OF PROCEDURE:  12/03/2015 DATE OF DISCHARGE:                              OPERATIVE REPORT   PREOPERATIVE DIAGNOSIS:  Abscess with infectious tenosynovitis, left hand.  POSTOPERATIVE DIAGNOSIS:  Abscess with infectious tenosynovitis, left hand.  PROCEDURE: 1. Irrigation and debridement, left hand, deep abscess. 2. Removal of vacuum-assisted closure device and extensor     tenosynovectomy (this was an extensive tenosynovectomy of the EDC     apparatus left hand radical in nature). 3. Rotation of flap-type closure, left hand dorsal aspect.  SURGEON:  Satira Anis. Amedeo Plenty, MD  ASSISTANT:  None.  COMPLICATIONS:  None.  ANESTHESIA:  General.  TOURNIQUET TIME:  Less than an hour.  INDICATIONS:  A very challenging 25 year old male with the above- mentioned diagnosis.  I have counseled him in regard to risks and benefits of surgery and he desires to proceed.  OPERATIVE PROCEDURE:  The patient was seen by myself and taken to the operative theater, underwent general anesthetic placed supine, prescrubbed with Hibiclens followed by Betadine scrub and paint.  I very carefully removed his vacuum assisted closure device.  All wound parameters looked pristine, this was a 2nd time in the operative theater, the wound has looked excellent.  At this time, I irrigated and debrided the abscess area with 3 L of saline.  I performed an extensive tenolysis, tenosynovectomy of the extensor digitorum communis to the index, middle, ring, and small fingers and the extensor indicis proprius, as well as the extensor digiti minimi tendons.  I then performed a complex removal of any nonviable tissue and friable tissue.  Following this, we then made a very gentle undermining measures with  the skin edges and between the fascia and skin, I mobilized the skin with rotation-type flap closure.  2-0 Prolene was used for the closure.  I was able to close him primarily over drain.  Following this, I hooked the drain up to suction and placed him in a sterile dressing of Mepitel followed by Xeroform, Adaptic, 4x4s, Kerlix, Webril, and a volar splint.  Tourniquet was deflated at less than an hour.  All questions were encouraged and answered.  He tolerated the procedure well.  He is going to be monitored in the recovery room and continue in an observatory posture in the hospital.  Hopefully, this will allow him to heal.  I would give him a guarded prognosis given his history.     Satira Anis. Amedeo Plenty, M.D.     [facility]  D:  12/03/2015  T:  12/03/2015  Job:  [id number]

## 2015-12-04 NOTE — Anesthesia Postprocedure Evaluation (Signed)
Anesthesia Post Note  Patient: Travis Callahan  Procedure(s) Performed: Procedure(s) (LRB): IRRIGATION AND DEBRIDEMENT EXTREMITY (Left)  Patient location during evaluation: PACU Anesthesia Type: General Level of consciousness: awake and alert Pain management: pain level controlled Vital Signs Assessment: post-procedure vital signs reviewed and stable Respiratory status: spontaneous breathing, nonlabored ventilation and respiratory function stable Cardiovascular status: blood pressure returned to baseline and stable Postop Assessment: no signs of nausea or vomiting Anesthetic complications: no    Last Vitals:  Filed Vitals:   12/04/15 0112 12/04/15 0656  BP: 149/66 150/71  Pulse: 88 87  Temp:  37 C  Resp: 16 16    Last Pain:  Filed Vitals:   12/04/15 0657  PainSc: Asleep                 Ivo Moga,W. EDMOND

## 2015-12-04 NOTE — Progress Notes (Signed)
Patient returned from surgery fairly comfortable. Around 2300, patient called out to the front desk crying asking to see this RN. Patient was found crying and a temp of 101.1. Patient c/o of how hot he was and that the top of his hand was on fire. Upon assessment, good cap refill, fingers warm and dry, ample space between the ace wrap cast and skin both distally and proximally. Patient stated that, "his hand hasn't hurt this bad before". Patient also stated that, "he could not feel when this RN touch his fingers"  Ice was placed under his right axillary area and on top of his cast. Patient was given a fan for comfort and to try to cool him down. Pain medicine given at 2330. Patient was resting comfortably. Patient's temp was retaken and was found to be 99. Patient continues to rest comfortably.

## 2015-12-04 NOTE — Progress Notes (Signed)
Patient ID: DARRAGH GRUENWALD, male   DOB: Sep 08, 1991, 25 y.o.   MRN: [id number]         [facility] for Infectious Disease    Date of Admission:  11/18/2015           Day 7 vancomycin         Mr. Bumanglag underwent I&D and wound closure of his hand last night, and reports no acute issues or worsening of symptoms.  I discussed this difficult situation with Dr. Amedeo Plenty today. We will discontinue vancomycin and administer oritavancin to cover for the remainder of his treatment.   I will arrange follow-up in my clinic within the next 2 weeks. Please call me for any further questions while he is here.  Plan: Discontinue vancomycin Administer oritavancin 1,200mg  x1 dose  Michel Bickers, MD [facility] for [place] [phone] pager   [phone] cell 12/04/2015, 3:36 PM

## 2015-12-04 NOTE — Progress Notes (Signed)
Pharmacy Antibiotic Follow-up Note  Travis Callahan is a 25 y.o. year-old male admitted on 11/27/2015.  The patient is currently on day 7 of Vancomycin for wound infection.  Assessment/Plan: 25 y/o M with well-known history (see H&P), left AMA several days ago and removed cast, now has very challenging open wound,   ID: ID following- cont on Vanc. May give dose of oritavancin prior to d/c S/p I&D 1/7 and 1/9. Renal function ok. Temp 101.1 overnight.  Vanc 1/7>> --1/13 VT 29 (drawn 3 hrs early based on previous dose) Fluc 1/7>>1/9 Ancef 1/6>>1/9  Cx data: 1/7 OP Cx: staph aureus  1/6: L hand wound: MRSA 1/3 BCx: ngtd  Plan:  - Hold AM dose of Vancomycin today. - Decrease Vancomycin slightly to 1g IV q8hr.  - to stay on vancomycin while inpt and then get dose of oritavancin prior to d/c due to hx of IVDU; unknown target date of d/c at present    [place]. Alford Highland, PharmD, BCPS Clinical Staff Pharmacist Pager [phone]  Wayland Salinas PharmD 12/04/2015 10:39 AM

## 2015-12-05 MED ORDER — ALPRAZOLAM 0.5 MG PO TABS: 0.5000 mg | ORAL_TABLET | Freq: Two times a day (BID) | ORAL | Status: DC

## 2015-12-05 MED ORDER — OXYCODONE HCL 5 MG PO TABS: 5.0000 mg | ORAL_TABLET | Freq: Four times a day (QID) | ORAL | Status: DC | PRN

## 2015-12-05 NOTE — Discharge Summary (Signed)
  Patient is seen and examined.  Arm is doing well he is neurovascular intact.  A long discussion about postop management other issues. I discussed with Erlene Quan daily these issues.  The present time he is stable for discharge.  We'll see him back in the office in 7 days roughly. He will elevate keep the area clean and dry notify me should any problems occur.  I went over Xanax and OxyIR use. I went over his follow-up with infectious disease clinic and other issues regarding his care.  I would recommend consultation with infectious disease on a periodic basis to monitor the hepatitis C issues in his candidacy towards a pharmacologic resolution  I discussed him all issues. We'll probably were simply opening to salvage the hand with the extensive efforts fourth over the last 8 days.  It is an under statement stated this patient is been a challenge however he does appear to be forthright and desires to improve  I discussed with him IV drug relapse and other issues. He did try his best to stay the path of sobriety  Unfortunately resources are limited for patients in his predicament we discussed this.  He has  seen behavioral health in the past and their recommendations are noted.  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

## 2015-12-05 NOTE — Discharge Summary (Signed)
Physician Discharge Summary  Patient ID: Travis Callahan MRN: [id number] DOB/AGE: August 23, 1991 25 y.o.  Admit date: 11/27/2015 Discharge date: 12/05/15  Admission Diagnoses: Infected left hand Past Medical History  Diagnosis Date  . MRSA (methicillin resistant Staphylococcus aureus)   . Peritonitis ([place])   . Gastric ulcer   . Stomach ulcer   . Crohn's   . Bowel obstruction ([place])   . Ulcer     Discharge Diagnoses:  Principal Problem:   Abscess of left hand Active Problems:   Normocytic anemia   Polysubstance abuse   Chronic hepatitis C ([place])   Status post appendectomy   History of peritonitis   History of resection of large bowel   Surgeries: Procedure(s): IRRIGATION AND DEBRIDEMENT EXTREMITY on 11/27/2015 - 12/03/2015    Consultants:    Discharged Condition: Improved  Hospital Course: Travis Callahan is an 25 y.o. male who was admitted 11/27/2015 with a chief complaint of Chief Complaint  Patient presents with  . Post-op Problem  . Fever  , and found to have a diagnosis of Infected left hand.  They were brought to the operating room on 11/27/2015 - 12/03/2015 and underwent Procedure(s): IRRIGATION AND DEBRIDEMENT EXTREMITY.    They were given perioperative antibiotics: Anti-infectives    Start     Dose/Rate Route Frequency Ordered Stop   12/04/15 1800  vancomycin (VANCOCIN) IVPB 1000 mg/200 mL premix  Status:  Discontinued     1,000 mg 200 mL/hr over 60 Minutes Intravenous Every 8 hours 12/04/15 1038 12/04/15 1512   12/04/15 1600  Oritavancin Diphosphate (ORBACTIV) 1,200 mg in dextrose 5 % IVPB     1,200 mg 333.3 mL/hr over 180 Minutes Intravenous Once 12/04/15 1512 12/04/15 2123   11/30/15 1730  vancomycin (VANCOCIN) 1,250 mg in sodium chloride 0.9 % 250 mL IVPB  Status:  Discontinued     1,250 mg 166.7 mL/hr over 90 Minutes Intravenous Every 8 hours 11/30/15 1717 12/04/15 1038   11/28/15 2000  ceFAZolin (ANCEF) IVPB 2 g/50 mL premix  Status:  Discontinued     2 g 100 mL/hr over 30 Minutes Intravenous Every 8 hours 11/28/15 1341 11/30/15 1604   11/28/15 1600  fluconazole (DIFLUCAN) tablet 400 mg  Status:  Discontinued     400 mg Oral Every 24 hours 11/28/15 1339 11/30/15 1604   11/28/15 1600  vancomycin (VANCOCIN) IVPB 1000 mg/200 mL premix  Status:  Discontinued     1,000 mg 200 mL/hr over 60 Minutes Intravenous Every 8 hours 11/28/15 1403 11/30/15 1717   11/28/15 0030  [MAR Hold]  vancomycin (VANCOCIN) IVPB 1000 mg/200 mL premix  Status:  Discontinued     (MAR Hold since 11/28/15 1039)   1,000 mg 200 mL/hr over 60 Minutes Intravenous Every 8 hours 11/28/15 0028 11/28/15 1341   11/27/15 2200  ceFAZolin (ANCEF) IVPB 1 g/50 mL premix  Status:  Discontinued     1 g 100 mL/hr over 30 Minutes Intravenous 3 times per day 11/27/15 2146 11/28/15 0022    .  They were given sequential compression devices, early ambulation, and Other (comment) for DVT prophylaxis.  Recent vital signs: Patient Vitals for the past 24 hrs:  BP Temp Temp src Pulse Resp SpO2  12/05/15 0556 138/71 mmHg 98.2 F (36.8 C) Oral 73 18 99 %  12/04/15 2011 (!) 159/73 mmHg 99 F (37.2 C) Oral 84 18 100 %  12/04/15 1626 (!) 158/73 mmHg 98.7 F (37.1 C) Oral 85 16 100 %  .  Recent laboratory  studies: No results found.  Discharge Medications:     Medication List    ASK your doctor about these medications        acetaminophen 500 MG tablet  Commonly known as:  TYLENOL  Take 1,000 mg by mouth every 6 (six) hours as needed for moderate pain or headache.     ciprofloxacin 500 MG tablet  Commonly known as:  CIPRO  Take 1 tablet (500 mg total) by mouth 2 (two) times daily.     ondansetron 4 MG disintegrating tablet  Commonly known as:  ZOFRAN-ODT  Take 4 mg by mouth every 8 (eight) hours as needed for nausea or vomiting.     oxycodone 5 MG capsule  Commonly known as:  OXY-IR  Take 2 capsules (10 mg total) by mouth every 4 (four) hours as needed.     pantoprazole 40 MG  tablet  Commonly known as:  PROTONIX  Take 40 mg by mouth daily.     promethazine 25 MG suppository  Commonly known as:  PHENERGAN  Place 25 mg rectally every 6 (six) hours as needed for nausea or vomiting.        Diagnostic Studies: Dg Hand 2 View Left  11/28/2015  CLINICAL DATA:  Dorsal left hand abscess. EXAM: LEFT HAND - 2 VIEW COMPARISON:  11/27/2015 left hand radiographs. FINDINGS: Severe soft tissue swelling in the dorsal left hand with associated soft tissue defect likely representing postsurgical change. No appreciable cortical erosions or periosteal reaction. No appreciable fracture, dislocation or suspicious focal osseous lesion. IMPRESSION: Severe dorsal left hand soft tissue swelling, with no radiographic evidence of osteomyelitis. Electronically Signed   By: Ilona Sorrel M.D.   On: 11/28/2015 08:54   Dg Hand Complete Left  11/27/2015  CLINICAL DATA:  LEFT hand surgery 5 days prior. Open wound on the dorsal aspect of the hand. EXAM: LEFT HAND - COMPLETE 3+ VIEW COMPARISON:  None. FINDINGS: Soft tissue swelling of the dorsum of the hand at the level of the distal metacarpals. No foreign body. No osseous abnormality. No subcutaneous gas. IMPRESSION: Soft tissue swelling of the dorsum of the hand. No osseous abnormality. Electronically Signed   By: Suzy Bouchard M.D.   On: 11/27/2015 18:10   Dg Hand Complete Left  11/13/2015  CLINICAL DATA:  25 year old male with history of MRI safe and open wound with incision and drainage EXAM: LEFT HAND - COMPLETE 3+ VIEW COMPARISON:  Radiograph dated 11/03/2015 FINDINGS: There is no evidence of fracture or dislocation. There is no evidence of arthropathy or other focal bone abnormality. There is soft tissue swelling of the hand. There is skin irregularity over the dorsum of the hand, likely related to incision and drainage. No radiopaque foreign object identified. Dressing noted over the dorsum of the hand. IMPRESSION: Soft tissue swelling and  incision skin wound over the dorsum of the hand. No radiopaque foreign object. No osseous pathology. Electronically Signed   By: Anner Crete M.D.   On: 11/13/2015 02:25    They benefited maximally from their hospital stay and there were no complications.      Disposition: stable     Signed: Paulene Floor 12/05/2015, 12:35 PM

## 2015-12-05 NOTE — Progress Notes (Signed)
Patient provided with discharge instructions and prescriptions. No questions at the time of discharge.  Patient encouraged to avoid smoking.   IV removed.  Dressing change completed.  Patient discharged to home, friend to drive patient home.

## 2015-12-06 NOTE — Discharge Summary (Signed)
  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

## 2015-12-07 ENCOUNTER — Emergency Department (HOSPITAL_COMMUNITY)
Admission: EM | Admit: 2015-12-07 | Discharge: 2015-12-07 | Discharge status: Against Medical Advice | Payer: Self-pay | Attending: Emergency Medicine | Admitting: Emergency Medicine

## 2015-12-07 ENCOUNTER — Encounter (HOSPITAL_COMMUNITY): Payer: Self-pay | Admitting: Emergency Medicine

## 2015-12-07 DIAGNOSIS — F17210 Nicotine dependence, cigarettes, uncomplicated: Secondary | ICD-10-CM | POA: Insufficient documentation

## 2015-12-07 DIAGNOSIS — Z4801 Encounter for change or removal of surgical wound dressing: Secondary | ICD-10-CM | POA: Insufficient documentation

## 2015-12-07 NOTE — ED Notes (Signed)
Attempt to call pt into available room; no response in lobby.

## 2015-12-07 NOTE — ED Notes (Signed)
Attempt to call pt into available room; pt not in room; no response in lobby.

## 2015-12-07 NOTE — ED Notes (Signed)
Pt reports noting yellow drainage from cast post shower this am; denies pain or other symptoms; request evaluation to make sure site not infected.

## 2015-12-09 ENCOUNTER — Encounter (HOSPITAL_COMMUNITY): Payer: Self-pay | Admitting: Emergency Medicine

## 2015-12-09 ENCOUNTER — Emergency Department (HOSPITAL_COMMUNITY)
Admission: EM | Admit: 2015-12-09 | Discharge: 2015-12-09 | Disposition: A | Discharge status: Home / Self Care | Payer: Self-pay | Attending: Emergency Medicine | Admitting: Emergency Medicine

## 2015-12-09 DIAGNOSIS — Z9889 Other specified postprocedural states: Secondary | ICD-10-CM | POA: Insufficient documentation

## 2015-12-09 DIAGNOSIS — Z8719 Personal history of other diseases of the digestive system: Secondary | ICD-10-CM | POA: Insufficient documentation

## 2015-12-09 DIAGNOSIS — F17210 Nicotine dependence, cigarettes, uncomplicated: Secondary | ICD-10-CM | POA: Insufficient documentation

## 2015-12-09 DIAGNOSIS — Z22322 Carrier or suspected carrier of Methicillin resistant Staphylococcus aureus: Secondary | ICD-10-CM | POA: Insufficient documentation

## 2015-12-09 DIAGNOSIS — F419 Anxiety disorder, unspecified: Secondary | ICD-10-CM | POA: Insufficient documentation

## 2015-12-09 DIAGNOSIS — Z79899 Other long term (current) drug therapy: Secondary | ICD-10-CM | POA: Insufficient documentation

## 2015-12-09 DIAGNOSIS — Z880 Allergy status to penicillin: Secondary | ICD-10-CM | POA: Insufficient documentation

## 2015-12-09 DIAGNOSIS — R000 Tachycardia, unspecified: Secondary | ICD-10-CM | POA: Insufficient documentation

## 2015-12-09 DIAGNOSIS — Z4801 Encounter for change or removal of surgical wound dressing: Secondary | ICD-10-CM | POA: Insufficient documentation

## 2015-12-09 DIAGNOSIS — Z5189 Encounter for other specified aftercare: Secondary | ICD-10-CM

## 2015-12-09 NOTE — Discharge Instructions (Signed)
Continue taking care of your wound as directed by Dr. Amedeo Plenty. Continue your MRSA treatments. Follow up with Dr. Amedeo Plenty at your appointment in 2 days for ongoing management of your illness. Return to the ER for changes or worsening symptoms.   Contact Precautions Some germs can be spread by touching the skin or body fluids of someone else. Contact precautions are rules that help to keep those germs from spreading from one person to another person. RULES FOR PATIENTS  Check with your nurse before you leave the room where you are being treated.  Wear a gown and a bandage (dressing) over the infected area if you need to leave the room where you are being treated.  Wash your hands often. RULES FOR VISITORS  Check with a nurse before you go into a room that has a sign that says "Contact Precautions."  If a nurse says that you can go in the room, wash your hands and put on a gown and gloves.  Do not take off your gown or gloves in the room until you are leaving.  Do not eat or drink in the room unless you ask a nurse first.  Do not touch anything in the room unless you ask a nurse first.  Right before you leave the room, take off your gown and then your gloves. Leave them in the room as told. Then wash your hands.   This information is not intended to replace advice given to you by your health care provider. Make sure you discuss any questions you have with your health care provider.   Document Released: 12/10/2010 Document Revised: 03/24/2015 Document Reviewed: 10/12/2014 Elsevier Interactive Patient Education 2016 [place] Infection, Adult MRSA stands for methicillin-resistant Staphylococcus aureus. This type of infection is caused by Staphylococcus aureus bacteria that are no longer affected by the medicines used to kill them (drug resistant). Staphylococcus (staph) bacteria are normally found on the skin or in the nose of healthy people. In most cases, these bacteria do not  cause infection. But if these resistant bacteria enter your body through a cut or sore, they can cause a serious infection on your skin or in other parts of your body. There is a slight chance that the staph on your skin or in your nose is MRSA. There are two types of MRSA infections:  Hospital-acquired MRSA is bacteria that you get in the hospital.  Community-acquired MRSA is bacteria that you get somewhere other than in a hospital. RISK FACTORS Hospital-acquired MRSA is more common. You could be at risk for this infection if you are in the hospital and you:  Have surgery or a procedure.  Have an IV access or a catheter tube placed in your body.  Have weak resistance to germs (weakened immune system).  Are elderly.  Are on kidney dialysis. You could be at risk for community-acquired MRSA if you have a break in your skin and come into contact with MRSA. This may happen if you:  Play sports where there is skin-to-skin contact.  Live in a crowded setting, like a dormitory or a [employer]  Share towels, razors, or sports equipment with other people. SYMPTOMS  Symptoms of hospital-acquired MRSA depend on where MRSA has spread. Symptoms may include:  Wound infection.  Skin infection.  Rash.  Pneumonia.  Fever and chills.  Difficulty breathing.  Chest pain. Community-acquired MRSA is most likely to start as a scratch or cut that becomes infected. Symptoms may include:  A pus-filled  pimple.  A boil on your skin.  Pus draining from your skin.  A sore (abscess) under your skin or somewhere in your body.  Fever with or without chills. DIAGNOSIS  The diagnosis of MRSA is made by taking a sample from an infected area and sending it to a lab for testing. A lab technician can grow (culture) MRSA and check it under a microscope. The cultured MRSA can be tested to see which type of antibiotic medicine will work to treat it. Newer tests can identify MRSA more quickly by  testing bacteria samples for MRSA genes. Your health care provider can diagnose MRSA using samples from:   Cuts or wounds in infected areas.  Nasal swabs.  Saliva or cough specimens from deep in the lungs (sputum).  Urine.  Blood. You may also have:  Imaging studies (such as X-ray or MRI) to check if the infection has spread to the lungs, bones, or joints.  A culture and sensitivity test of blood or fluids from inside the joints. TREATMENT  Treatment depends on how severe, deep, or extensive the infection is. Very bad infections may require a hospital stay.  Some skin infections, such as a small boil or sore (abscess), may be treated by draining pus from the site of the infection.  More extensive surgery to drain pus may be necessary for deeper or more widespread soft tissue infections.  You may then have to take antibiotic medicine given by mouth or through a vein. You may start antibiotic treatment right away or after testing can be done to see what antibiotic medicine should be used. HOME CARE INSTRUCTIONS   Take your antibiotics as directed by your health care provider. Take the medicine as prescribed until it is finished.  Avoid close contact with those around you as much as possible. Do not use towels, razors, toothbrushes, bedding, or other items that will be used by others.  Wash your hands frequently for 15 seconds with soap and water. Dry your hands with a clean or disposable towel.  When you are not able to wash your hands, use hand sanitizer that is more than 60 percent alcohol.  Wash towels, sheets, or clothes in the washing machine with detergent and hot water. Dry them in a hot dryer.  Follow your health care provider's instructions for wound care. Wash your hands before and after changing your bandages.  Always shower after exercising.  Keep all cuts and scrapes clean and covered with a bandage.  Be sure to tell all your health care providers that you have  MRSA so they are aware of your infection. SEEK MEDICAL CARE IF:  You have a cut, scrape, pimple, or boil that becomes red, swollen, or painful or has pus in it.  You have pus draining from your skin.  You have an abscess under your skin or somewhere in your body. SEEK IMMEDIATE MEDICAL CARE IF:   You have symptoms of a skin infection with a fever or chills.  You have trouble breathing.  You have chest pain.  You have a skin wound and you become nauseous or start vomiting. MAKE SURE YOU:  Understand these instructions.  Will watch your condition.  Will get help right away if you are not doing well or get worse.   This information is not intended to replace advice given to you by your health care provider. Make sure you discuss any questions you have with your health care provider.   Document Released: 11/07/2005 Document Revised:  03/24/2015 Document Reviewed: 08/30/2013 Elsevier Interactive Patient Education 2016 [place] Taking care of your wound properly can help to prevent pain and infection. It can also help your wound to heal more quickly.  HOW TO CARE FOR YOUR WOUND  Take or apply over-the-counter and prescription medicines only as told by your health care provider.  If you were prescribed antibiotic medicine, take or apply it as told by your health care provider. Do not stop using the antibiotic even if your condition improves.  Clean the wound each day or as told by your health care provider.  Wash the wound with mild soap and water.  Rinse the wound with water to remove all soap.  Pat the wound dry with a clean towel. Do not rub it.  There are many different ways to close and cover a wound. For example, a wound can be covered with stitches (sutures), skin glue, or adhesive strips. Follow instructions from your health care provider about:  How to take care of your wound.  When and how you should change your bandage (dressing).  When you  should remove your dressing.  Removing whatever was used to close your wound.  Check your wound every day for signs of infection. Watch for:  Redness, swelling, or pain.  Fluid, blood, or pus.  Keep the dressing dry until your health care provider says it can be removed. Do not take baths, swim, use a hot tub, or do anything that would put your wound underwater until your health care provider approves.  Raise (elevate) the injured area above the level of your heart while you are sitting or lying down.  Do not scratch or pick at the wound.  Keep all follow-up visits as told by your health care provider. This is important. SEEK MEDICAL CARE IF:  You received a tetanus shot and you have swelling, severe pain, redness, or bleeding at the injection site.  You have a fever.  Your pain is not controlled with medicine.  You have increased redness, swelling, or pain at the site of your wound.  You have fluid, blood, or pus coming from your wound.  You notice a bad smell coming from your wound or your dressing. SEEK IMMEDIATE MEDICAL CARE IF:  You have a red streak going away from your wound.   This information is not intended to replace advice given to you by your health care provider. Make sure you discuss any questions you have with your health care provider.   Document Released: 08/16/2008 Document Revised: 03/24/2015 Document Reviewed: 11/03/2014 Elsevier Interactive Patient Education [employer].

## 2015-12-09 NOTE — ED Notes (Signed)
PT st's he was recently in hosp for surg to left hand,  Was dx with MRSA and staph infection.  Pt has been doing nose swabs at home and cannot return to class but needs a note saying he has MRSA so that he does not get dropped from class.  Pt attempted to go to Dr. Carmelia Bake office but Dr. Amedeo Plenty is out of the office until Fri.  Pt st's he has no complaints and does not need any treatment

## 2015-12-09 NOTE — ED Provider Notes (Signed)
CSN: [id number]     Arrival date & time 12/09/15  1721 History  By signing my name below, I, Travis Callahan, attest that this documentation has been prepared under the direction and in the presence of Travis Selinger Camprubi-Soms, PA-C. Electronically Signed: [name], ED Scribe. 12/09/2015. 6:06 PM.      Chief Complaint  Patient presents with  . Follow-up   The history is provided by the patient. No language interpreter was used.   HPI Comments: Travis Callahan is a 25 y.o. male with a PMHx of MRSA and L hand I&D by Dr. Amedeo Plenty on 11/27/15, who presents to the Emergency Department for follow up of his left hand surgery. Pt states that his hand is healing well. Pt states that he has follow up with Dr. Amedeo Plenty in 2 days. Pt is requesting a school note stating that he is on contact precautions due to testing positive for MRSA, states he's compliant with the nasal swab treatments. Had a note until today, but needs another week off due to treatments/contact precautions, and couldn't get an appt with Dr. Amedeo Plenty for another two days. Pt denies red streaking, swelling/warmth to the area, worsening pain of L hand, drainage, fever, chills, CP, SOB, abd pain, nausea, vomiting, diarrhea, numbness, tingling, or weakness. Pt does report drinking 2 red bulls and a cappuccino PTA, which is why his heart rate is high.  Past Medical History  Diagnosis Date  . MRSA (methicillin resistant Staphylococcus aureus)   . Peritonitis ([place])   . Gastric ulcer   . Stomach ulcer   . Crohn's   . Bowel obstruction ([place])   . Ulcer    Past Surgical History  Procedure Laterality Date  . Gastric ulcer perforation      repair   . Incision and drainage of wound Right 10/11/2015    Procedure: IRRIGATION AND DEBRIDEMENT right upper arm;  Surgeon: Roseanne Kaufman, MD;  Location: [facility];  Service: Orthopedics;  Laterality: Right;  . Stomach surgery    . Appendectomy    . Abdominal surgery    . I&d extremity Right 11/03/2015     Procedure: IRRIGATION AND DEBRIDEMENT RIGHT ELBOW ;  Surgeon: Roseanne Kaufman, MD;  Location: [place];  Service: Orthopedics;  Laterality: Right;  . Irrigation and debridement abscess Left 11/03/2015    Procedure: IRRIGATION AND DEBRIDEMENT LEFT HAND ABSCESS;  Surgeon: Roseanne Kaufman, MD;  Location: [place];  Service: Orthopedics;  Laterality: Left;  . I&d extremity Left 11/17/2015    Procedure: IRRIGATION AND DEBRIDEMENT LEFT HAND;  Surgeon: Roseanne Kaufman, MD;  Location: [place];  Service: Orthopedics;  Laterality: Left;  . I&d extremity Left 11/19/2015    Procedure: IRRIGATION AND DEBRIDEMENT EXTREMITY;  Surgeon: Roseanne Kaufman, MD;  Location: [place];  Service: Orthopedics;  Laterality: Left;  . I&d extremity Left 11/28/2015    Procedure: IRRIGATION AND DEBRIDEMENT HAND;  Surgeon: Roseanne Kaufman, MD;  Location: [place];  Service: Orthopedics;  Laterality: Left;  . I&d extremity Left 11/30/2015    Procedure: IRRIGATION AND DEBRIDEMENT OF LEFT HAND;  Surgeon: Roseanne Kaufman, MD;  Location: [place] [facility];  Service: Orthopedics;  Laterality: Left;  . Application of wound vac Left 11/30/2015    Procedure: APPLICATION OF WOUND VAC;  Surgeon: Roseanne Kaufman, MD;  Location: [place];  Service: Orthopedics;  Laterality: Left;  . I&d extremity Left 12/03/2015    Procedure: IRRIGATION AND DEBRIDEMENT EXTREMITY;  Surgeon: Roseanne Kaufman, MD;  Location: [place];  Service: Orthopedics;  Laterality: Left;  Family History  Problem Relation Age of Onset  . Heart failure Mother   . Congestive Heart Failure Mother    Social History  Substance Use Topics  . Smoking status: Current Every Day Smoker -- 0.25 packs/day    Types: Cigarettes  . Smokeless tobacco: None  . Alcohol Use: No     Comment: socially    Review of Systems  Constitutional: Negative for fever and chills.  Respiratory: Negative for shortness of breath.   Cardiovascular: Negative for chest pain.  Gastrointestinal: Negative for nausea, vomiting, abdominal pain,  diarrhea and constipation.  Genitourinary: Negative for dysuria and hematuria.  Musculoskeletal: Negative for myalgias and arthralgias.  Skin: Positive for wound (surgical wound L hand). Negative for color change.  Allergic/Immunologic: Negative for immunocompromised state.  Neurological: Negative for weakness and numbness.  Psychiatric/Behavioral: Negative for confusion.   10 Systems reviewed and all are negative for acute change except as noted in the HPI.     Allergies  Penicillins; Nsaids; Ivp dye; Ketorolac tromethamine; Morphine and related; and Tramadol  Home Medications   Prior to Admission medications   Medication Sig Start Date End Date Taking? Authorizing Provider  ALPRAZolam Duanne Moron) 0.5 MG tablet Take 1 tablet (0.5 mg total) by mouth 2 (two) times daily. 12/05/15   Roseanne Kaufman, MD  oxyCODONE (OXY IR/ROXICODONE) 5 MG immediate release tablet Take 1-2 tablets (5-10 mg total) by mouth every 6 (six) hours as needed for moderate pain. 12/05/15   Roseanne Kaufman, MD   BP 138/84 mmHg  Pulse 136  Temp(Src) 99.2 F (37.3 C) (Oral)  Resp 16  Ht 5\' 9"  (1.753 m)  Wt 160 lb (72.576 kg)  BMI 23.62 kg/m2  SpO2 100%   Physical Exam  Constitutional: He is oriented to person, place, and time. Vital signs are normal. He appears well-developed and well-nourished.  Non-toxic appearance. No distress.  Afebrile, nontoxic, NAD  HENT:  Head: Normocephalic and atraumatic.  Mouth/Throat: Mucous membranes are normal.  Eyes: Conjunctivae and EOM are normal. Right eye exhibits no discharge. Left eye exhibits no discharge.  Neck: Normal range of motion. Neck supple.  Cardiovascular: Tachycardia present.   Tachycardiac but appear anxious. Somewhat improved on re-check  Pulmonary/Chest: Effort normal. No respiratory distress.  Abdominal: Normal appearance. He exhibits no distension.  Musculoskeletal: Normal range of motion.  Left hand with FROM in all digits, no swelling or erythema  surrounding a well healing wound to the dorsal aspect of the hand, no drainage, no warmth, no focal bony tenderness. Strength and sensation grossly intact, distal pulses intact.   Neurological: He is alert and oriented to person, place, and time. He has normal strength. No sensory deficit.  Skin: Skin is warm and dry. No rash noted.  Psychiatric: His mood appears anxious.  Slightly anxious appearing  Nursing note and vitals reviewed.   ED Course  Procedures (including critical care time) DIAGNOSTIC STUDIES: Oxygen Saturation is 100% on RA, normal by my interpretation.    COORDINATION OF CARE: 6:04 PM-Discussed treatment plan with pt at bedside and pt agreed to plan.    Labs Review Labs Reviewed - No data to display  Imaging Review No results found.    EKG Interpretation None      MDM   Final diagnoses:  Visit for wound check  MRSA carrier     25 y.o. male here for wound check and needing a work note. States he is under contact precautions due to testing + for MRSA, undergoing nasal swab treatment,  had note until today but still undergoing tx and needs to be out for another week. Wound checked and looks great, well healing, no concerning signs/symptoms for infection. Pt tachycardic but this is consistent with prior readings, and with the fact that he just had large amounts of caffeine. No complaints today. Has f/up with Dr. Amedeo Plenty in 2 days. Discussed wound care and f/up with Dr. Amedeo Plenty for ongoing management. Work note given. I explained the diagnosis and have given explicit precautions to return to the ER including for any other new or worsening symptoms. The patient understands and accepts the medical plan as it's been dictated and I have answered their questions. Discharge instructions concerning home care and prescriptions have been given. The patient is STABLE and is discharged to home in good condition.   I personally performed the services described in this  documentation, which was scribed in my presence. The recorded information has been reviewed and is accurate.  BP 138/84 mmHg  Pulse 120  Temp(Src) 99.2 F (37.3 C) (Oral)  Resp 18  Ht 5\' 9"  (1.753 m)  Wt 72.576 kg  BMI 23.62 kg/m2  SpO2 100%  No orders of the defined types were placed in this encounter.        Arul Farabee Camprubi-Soms, PA-C 12/09/15 1816  Orlie Dakin, MD 12/10/15 0002

## 2015-12-11 ENCOUNTER — Emergency Department (HOSPITAL_COMMUNITY): Payer: Self-pay

## 2015-12-11 ENCOUNTER — Emergency Department (HOSPITAL_COMMUNITY)
Admission: EM | Admit: 2015-12-11 | Discharge: 2015-12-11 | Disposition: A | Discharge status: Home / Self Care | Payer: Self-pay | Attending: Emergency Medicine | Admitting: Emergency Medicine

## 2015-12-11 ENCOUNTER — Encounter (HOSPITAL_COMMUNITY): Payer: Self-pay | Admitting: Emergency Medicine

## 2015-12-11 DIAGNOSIS — R1031 Right lower quadrant pain: Secondary | ICD-10-CM | POA: Insufficient documentation

## 2015-12-11 DIAGNOSIS — Z8719 Personal history of other diseases of the digestive system: Secondary | ICD-10-CM | POA: Insufficient documentation

## 2015-12-11 DIAGNOSIS — R1030 Lower abdominal pain, unspecified: Secondary | ICD-10-CM | POA: Insufficient documentation

## 2015-12-11 DIAGNOSIS — Z8614 Personal history of Methicillin resistant Staphylococcus aureus infection: Secondary | ICD-10-CM | POA: Insufficient documentation

## 2015-12-11 DIAGNOSIS — R109 Unspecified abdominal pain: Secondary | ICD-10-CM

## 2015-12-11 DIAGNOSIS — R000 Tachycardia, unspecified: Secondary | ICD-10-CM | POA: Insufficient documentation

## 2015-12-11 DIAGNOSIS — Z79899 Other long term (current) drug therapy: Secondary | ICD-10-CM | POA: Insufficient documentation

## 2015-12-11 DIAGNOSIS — R1111 Vomiting without nausea: Secondary | ICD-10-CM | POA: Insufficient documentation

## 2015-12-11 DIAGNOSIS — R197 Diarrhea, unspecified: Secondary | ICD-10-CM | POA: Insufficient documentation

## 2015-12-11 DIAGNOSIS — F17210 Nicotine dependence, cigarettes, uncomplicated: Secondary | ICD-10-CM | POA: Insufficient documentation

## 2015-12-11 DIAGNOSIS — Z880 Allergy status to penicillin: Secondary | ICD-10-CM | POA: Insufficient documentation

## 2015-12-11 DIAGNOSIS — R509 Fever, unspecified: Secondary | ICD-10-CM | POA: Insufficient documentation

## 2015-12-11 DIAGNOSIS — R1011 Right upper quadrant pain: Secondary | ICD-10-CM | POA: Insufficient documentation

## 2015-12-11 LAB — CBC WITH DIFFERENTIAL/PLATELET
Basophils Absolute: 0.1 10*3/uL (ref 0.0–0.1)
Basophils Relative: 1 %
Eosinophils Absolute: 0.2 10*3/uL (ref 0.0–0.7)
Eosinophils Relative: 3 %
HCT: 35.9 % — ABNORMAL LOW (ref 39.0–52.0)
Hemoglobin: 11.9 g/dL — ABNORMAL LOW (ref 13.0–17.0)
LYMPHS ABS: 2.5 10*3/uL (ref 0.7–4.0)
LYMPHS PCT: 40 %
MCH: 28.5 pg (ref 26.0–34.0)
MCHC: 33.1 g/dL (ref 30.0–36.0)
MCV: 86.1 fL (ref 78.0–100.0)
MONOS PCT: 14 %
Monocytes Absolute: 0.9 10*3/uL (ref 0.1–1.0)
NEUTROS ABS: 2.7 10*3/uL (ref 1.7–7.7)
NEUTROS PCT: 42 %
PLATELETS: 381 10*3/uL (ref 150–400)
RBC: 4.17 MIL/uL — AB (ref 4.22–5.81)
RDW: 14.6 % (ref 11.5–15.5)
WBC: 6.2 10*3/uL (ref 4.0–10.5)

## 2015-12-11 LAB — COMPREHENSIVE METABOLIC PANEL
ALT: 32 U/L (ref 17–63)
AST: 27 U/L (ref 15–41)
Albumin: 3.7 g/dL (ref 3.5–5.0)
Alkaline Phosphatase: 90 U/L (ref 38–126)
Anion gap: 14 (ref 5–15)
BUN: 12 mg/dL (ref 6–20)
CHLORIDE: 105 mmol/L (ref 101–111)
CO2: 25 mmol/L (ref 22–32)
CREATININE: 0.94 mg/dL (ref 0.61–1.24)
Calcium: 10 mg/dL (ref 8.9–10.3)
Glucose, Bld: 102 mg/dL — ABNORMAL HIGH (ref 65–99)
POTASSIUM: 4.5 mmol/L (ref 3.5–5.1)
SODIUM: 144 mmol/L (ref 135–145)
Total Bilirubin: 0.4 mg/dL (ref 0.3–1.2)
Total Protein: 7.3 g/dL (ref 6.5–8.1)

## 2015-12-11 LAB — URINALYSIS, ROUTINE W REFLEX MICROSCOPIC
Bilirubin Urine: NEGATIVE
GLUCOSE, UA: NEGATIVE mg/dL
HGB URINE DIPSTICK: NEGATIVE
Ketones, ur: NEGATIVE mg/dL
Leukocytes, UA: NEGATIVE
Nitrite: NEGATIVE
PROTEIN: NEGATIVE mg/dL
SPECIFIC GRAVITY, URINE: 1.022 (ref 1.005–1.030)
pH: 7.5 (ref 5.0–8.0)

## 2015-12-11 LAB — I-STAT CG4 LACTIC ACID, ED
LACTIC ACID, VENOUS: 1.46 mmol/L (ref 0.5–2.0)
Lactic Acid, Venous: 2.83 mmol/L (ref 0.5–2.0)

## 2015-12-11 MED ORDER — HYDROMORPHONE HCL 1 MG/ML IJ SOLN
0.5000 mg | Freq: Once | INTRAMUSCULAR | Status: AC
  Administered 2015-12-11: 0.5 mg via INTRAVENOUS
  Filled 2015-12-11: qty 1

## 2015-12-11 MED ORDER — SODIUM CHLORIDE 0.9 % IV BOLUS (SEPSIS)
500.0000 mL | INTRAVENOUS | Status: AC
  Administered 2015-12-11: 500 mL via INTRAVENOUS

## 2015-12-11 MED ORDER — LEVOFLOXACIN IN D5W 750 MG/150ML IV SOLN
750.0000 mg | Freq: Once | INTRAVENOUS | Status: DC
  Administered 2015-12-11: 750 mg via INTRAVENOUS
  Filled 2015-12-11: qty 150

## 2015-12-11 MED ORDER — VANCOMYCIN HCL IN DEXTROSE 1-5 GM/200ML-% IV SOLN: 1000.0000 mg | Freq: Once | INTRAVENOUS | Status: DC

## 2015-12-11 MED ORDER — DEXTROSE 5 % IV SOLN
2.0000 g | Freq: Once | INTRAVENOUS | Status: AC
  Administered 2015-12-11: 2 g via INTRAVENOUS
  Filled 2015-12-11: qty 2

## 2015-12-11 MED ORDER — ONDANSETRON HCL 4 MG PO TABS: 4.0000 mg | ORAL_TABLET | Freq: Three times a day (TID) | ORAL | Status: DC | PRN

## 2015-12-11 MED ORDER — METRONIDAZOLE IN NACL 5-0.79 MG/ML-% IV SOLN
500.0000 mg | Freq: Once | INTRAVENOUS | Status: AC
  Administered 2015-12-11: 500 mg via INTRAVENOUS
  Filled 2015-12-11: qty 100

## 2015-12-11 MED ORDER — SODIUM CHLORIDE 0.9 % IV BOLUS (SEPSIS)
1000.0000 mL | INTRAVENOUS | Status: AC
  Administered 2015-12-11 (×2): 1000 mL via INTRAVENOUS

## 2015-12-11 MED ORDER — DIPHENHYDRAMINE HCL 50 MG/ML IJ SOLN
50.0000 mg | Freq: Once | INTRAMUSCULAR | Status: AC
  Administered 2015-12-11: 50 mg via INTRAVENOUS
  Filled 2015-12-11: qty 1

## 2015-12-11 MED ORDER — OXYCODONE HCL 5 MG PO TABS
10.0000 mg | ORAL_TABLET | Freq: Once | ORAL | Status: AC
  Administered 2015-12-11: 10 mg via ORAL
  Filled 2015-12-11: qty 2

## 2015-12-11 MED ORDER — VANCOMYCIN HCL 10 G IV SOLR
1500.0000 mg | Freq: Once | INTRAVENOUS | Status: AC
  Administered 2015-12-11: 1500 mg via INTRAVENOUS
  Filled 2015-12-11: qty 1500

## 2015-12-11 MED ORDER — ONDANSETRON HCL 4 MG/2ML IJ SOLN
4.0000 mg | Freq: Once | INTRAMUSCULAR | Status: AC
  Administered 2015-12-11: 4 mg via INTRAVENOUS
  Filled 2015-12-11: qty 2

## 2015-12-11 MED ORDER — VANCOMYCIN HCL IN DEXTROSE 1-5 GM/200ML-% IV SOLN: 1000.0000 mg | Freq: Three times a day (TID) | INTRAVENOUS | Status: DC

## 2015-12-11 MED ORDER — DICYCLOMINE HCL 10 MG PO CAPS: 20.0000 mg | ORAL_CAPSULE | Freq: Four times a day (QID) | ORAL | Status: DC | PRN

## 2015-12-11 MED ORDER — IOHEXOL 300 MG/ML  SOLN
100.0000 mL | Freq: Once | INTRAMUSCULAR | Status: AC | PRN
Start: 2015-12-11 — End: 2015-12-11
  Administered 2015-12-11: 100 mL via INTRAVENOUS

## 2015-12-11 MED ORDER — METRONIDAZOLE IN NACL 5-0.79 MG/ML-% IV SOLN: 500.0000 mg | Freq: Three times a day (TID) | INTRAVENOUS | Status: DC

## 2015-12-11 MED ORDER — DEXTROSE 5 % IV SOLN: 2.0000 g | Freq: Once | INTRAVENOUS | Status: DC

## 2015-12-11 MED ORDER — DEXTROSE 5 % IV SOLN
1.0000 g | Freq: Three times a day (TID) | INTRAVENOUS | Status: DC
  Filled 2015-12-11 (×2): qty 1

## 2015-12-11 MED ORDER — METHYLPREDNISOLONE SODIUM SUCC 125 MG IJ SOLR
125.0000 mg | Freq: Once | INTRAMUSCULAR | Status: AC
  Administered 2015-12-11: 125 mg via INTRAVENOUS
  Filled 2015-12-11: qty 2

## 2015-12-11 NOTE — Progress Notes (Signed)
ANTIBIOTIC CONSULT NOTE - INITIAL  Pharmacy Consult for vancomycin/aztreonam/flagyl Indication: sepsis  Allergies  Allergen Reactions  . Penicillins Other (See Comments)    HAS TOLERATED CEFAZOLIN WITHOUT PROBLEM Childhood reaction Has patient had a PCN reaction causing immediate rash, facial/tongue/throat swelling, SOB or lightheadedness with hypotension: Unknown Has patient had a PCN reaction causing severe rash involving mucus membranes or skin necrosis: No Has patient had a PCN reaction that required hospitalization No Has patient had a PCN reaction occurring within the last 10 years: No If all of the above answers are "NO", then may proceed with Cephalosporin [place]  . Nsaids Other (See Comments)    Ulcers   . Ivp Dye [Iodinated Diagnostic Agents] Other (See Comments)    Needs solumedrol and benadryl prior  . Ketorolac Tromethamine Hives, Itching and Other (See Comments)    STOMACH ULCER  . Morphine And Related Hives and Itching  . Tramadol Nausea And Vomiting    Ulcer     Patient Measurements: Height: 5\' 9"  (175.3 cm) Weight: 160 lb (72.576 kg) IBW/kg (Calculated) : 70.7   Vital Signs: Temp: 99 F (37.2 C) (01/20 1000) Temp Source: Oral (01/20 1000) BP: 153/79 mmHg (01/20 1000) Pulse Rate: 130 (01/20 1000) Intake/Output from previous day:   Intake/Output from this shift:    Labs: No results for input(s): WBC, HGB, PLT, LABCREA, CREATININE in the last 72 hours. Estimated Creatinine Clearance: 141.2 mL/min (by C-G formula based on Cr of 0.74). No results for input(s): VANCOTROUGH, VANCOPEAK, VANCORANDOM, GENTTROUGH, GENTPEAK, GENTRANDOM, TOBRATROUGH, TOBRAPEAK, TOBRARND, AMIKACINPEAK, AMIKACINTROU, AMIKACIN in the last 72 hours.   Microbiology: Recent Results (from the past 720 hour(s))  Surgical pcr screen     Status: Abnormal   Collection Time: 11/16/15  9:15 PM  Result Value Ref Range Status   MRSA, PCR NEGATIVE NEGATIVE Final   Staphylococcus aureus POSITIVE  (A) NEGATIVE Final    Comment:        The Xpert SA Assay (FDA approved for NASAL specimens in patients over 42 years of age), is one component of a comprehensive surveillance program.  Test performance has been validated by [facility] for patients greater than or equal to 61 year old. It is not intended to diagnose infection nor to guide or monitor treatment.   Culture, blood (routine x 2)     Status: None   Collection Time: 11/27/15  4:07 PM  Result Value Ref Range Status   Specimen Description BLOOD RIGHT HAND  Final   Special Requests BOTTLES DRAWN AEROBIC AND ANAEROBIC 4 ML  Final   Culture   Final    NO GROWTH 5 DAYS Performed at [facility]    Report Status 12/02/2015 FINAL  Final  Culture, blood (routine x 2)     Status: None   Collection Time: 11/27/15  4:30 PM  Result Value Ref Range Status   Specimen Description BLOOD RIGHT HAND  Final   Special Requests BOTTLES DRAWN AEROBIC AND ANAEROBIC 5 CC  Final   Culture   Final    NO GROWTH 5 DAYS Performed at [facility]    Report Status 12/02/2015 FINAL  Final  Wound culture     Status: None   Collection Time: 11/27/15  8:54 PM  Result Value Ref Range Status   Specimen Description WOUND LEFT HAND  Final   Special Requests Normal  Final   Gram Stain   Final    MODERATE WBC PRESENT,BOTH PMN AND MONONUCLEAR NO SQUAMOUS EPITHELIAL CELLS  SEEN MODERATE GRAM POSITIVE COCCI IN PAIRS Performed at [facility]    Culture   Final    ABUNDANT METHICILLIN RESISTANT STAPHYLOCOCCUS AUREUS DUMFER 11/29/14 1100 BY SMITHERSJ Note: RIFAMPIN AND GENTAMICIN SHOULD NOT BE USED AS SINGLE DRUGS FOR TREATMENT OF STAPH INFECTIONS. CRITICAL RESULT CALLED TO, READ BACK BY AND VERIFIED WITH: KAREN LOWE Performed at [facility]    Report Status 11/30/2015 FINAL  Final   Organism ID, Bacteria METHICILLIN RESISTANT STAPHYLOCOCCUS AUREUS  Final      Susceptibility   Methicillin resistant staphylococcus aureus -  MIC*    CLINDAMYCIN >=8 RESISTANT Resistant     ERYTHROMYCIN >=8 RESISTANT Resistant     GENTAMICIN <=0.5 SENSITIVE Sensitive     LEVOFLOXACIN >=8 RESISTANT Resistant     OXACILLIN >=4 RESISTANT Resistant     RIFAMPIN <=0.5 SENSITIVE Sensitive     TRIMETH/SULFA <=10 SENSITIVE Sensitive     VANCOMYCIN 1 SENSITIVE Sensitive     TETRACYCLINE <=1 SENSITIVE Sensitive     * ABUNDANT METHICILLIN RESISTANT STAPHYLOCOCCUS AUREUS  Anaerobic culture     Status: None   Collection Time: 11/27/15  8:54 PM  Result Value Ref Range Status   Specimen Description WOUND LEFT HAND  Final   Special Requests Normal  Final   Gram Stain   Final    NO WBC SEEN NO SQUAMOUS EPITHELIAL CELLS SEEN NO ORGANISMS SEEN Performed at [facility]    Culture   Final    NO ANAEROBES ISOLATED Performed at [facility]    Report Status 12/03/2015 FINAL  Final  Fungus Culture with Smear     Status: None (Preliminary result)   Collection Time: 11/27/15  8:54 PM  Result Value Ref Range Status   Specimen Description HAND LEFT  Final   Special Requests NONE  Final   Fungal Smear   Final    NO YEAST OR FUNGAL ELEMENTS SEEN Performed at [facility]    Culture   Final    CULTURE IN PROGRESS FOR FOUR WEEKS Performed at [facility]    Report Status PENDING  Incomplete  MRSA PCR Screening     Status: None   Collection Time: 11/28/15 12:35 AM  Result Value Ref Range Status   MRSA by PCR NEGATIVE NEGATIVE Final    Comment:        The GeneXpert MRSA Assay (FDA approved for NASAL specimens only), is one component of a comprehensive MRSA colonization surveillance program. It is not intended to diagnose MRSA infection nor to guide or monitor treatment for MRSA infections.   Anaerobic culture     Status: None   Collection Time: 11/28/15 12:27 PM  Result Value Ref Range Status   Specimen Description WOUND LEFT HAND  Final   Special Requests NONE  Final   Gram Stain   Final     MODERATE WBC PRESENT,BOTH PMN AND MONONUCLEAR NO SQUAMOUS EPITHELIAL CELLS SEEN NO ORGANISMS SEEN Performed at [facility]    Culture   Final    NO ANAEROBES ISOLATED Performed at [facility]    Report Status 12/03/2015 FINAL  Final  Wound culture     Status: None   Collection Time: 11/28/15 12:27 PM  Result Value Ref Range Status   Specimen Description WOUND LEFT HAND  Final   Special Requests NONE  Final   Gram Stain   Final    ABUNDANT WBC PRESENT,BOTH PMN AND MONONUCLEAR NO SQUAMOUS EPITHELIAL CELLS SEEN NO  ORGANISMS SEEN Performed at [facility]    Culture   Final    MODERATE METHICILLIN RESISTANT STAPHYLOCOCCUS AUREUS Note: RIFAMPIN AND GENTAMICIN SHOULD NOT BE USED AS SINGLE DRUGS FOR TREATMENT OF STAPH INFECTIONS. CRITICAL RESULT CALLED TO, READ BACK BY AND VERIFIED WITH: ADRIENNE RN ON 5N AT 0820 [id number] BY CASTC Performed at [facility]    Report Status 12/01/2015 FINAL  Final   Organism ID, Bacteria METHICILLIN RESISTANT STAPHYLOCOCCUS AUREUS  Final      Susceptibility   Methicillin resistant staphylococcus aureus - MIC*    CLINDAMYCIN >=8 RESISTANT Resistant     ERYTHROMYCIN >=8 RESISTANT Resistant     GENTAMICIN <=0.5 SENSITIVE Sensitive     LEVOFLOXACIN >=8 RESISTANT Resistant     OXACILLIN >=4 RESISTANT Resistant     RIFAMPIN <=0.5 SENSITIVE Sensitive     TRIMETH/SULFA <=10 SENSITIVE Sensitive     VANCOMYCIN 1 SENSITIVE Sensitive     TETRACYCLINE <=1 SENSITIVE Sensitive     * MODERATE METHICILLIN RESISTANT STAPHYLOCOCCUS AUREUS  Fungus Culture with Smear     Status: None (Preliminary result)   Collection Time: 11/28/15 12:27 PM  Result Value Ref Range Status   Specimen Description WOUND LEFT HAND  Final   Special Requests NONE  Final   Fungal Smear   Final    NO YEAST OR FUNGAL ELEMENTS SEEN Performed at [facility]    Culture   Final    CULTURE IN PROGRESS FOR FOUR WEEKS Performed at [facility]    Report Status PENDING  Incomplete  Anaerobic culture     Status: None   Collection Time: 11/28/15 12:51 PM  Result Value Ref Range Status   Specimen Description TISSUE LEFT HAND  Final   Special Requests POF VANCOMYCIN  Final   Gram Stain   Final    MODERATE WBC PRESENT,BOTH PMN AND MONONUCLEAR NO SQUAMOUS EPITHELIAL CELLS SEEN MODERATE GRAM POSITIVE COCCI IN PAIRS Performed at [facility]    Culture   Final    NO ANAEROBES ISOLATED Performed at [facility]    Report Status 12/03/2015 FINAL  Final  Fungus Culture with Smear     Status: None (Preliminary result)   Collection Time: 11/28/15 12:51 PM  Result Value Ref Range Status   Specimen Description TISSUE LEFT HAND  Final   Special Requests POF VANCOMYCIN  Final   Fungal Smear   Final    NO YEAST OR FUNGAL ELEMENTS SEEN Performed at [facility]    Culture   Final    CULTURE IN PROGRESS FOR FOUR WEEKS Performed at [facility]    Report Status PENDING  Incomplete  Tissue culture     Status: None   Collection Time: 11/28/15 12:51 PM  Result Value Ref Range Status   Specimen Description TISSUE LEFT HAND  Final   Special Requests POF VANCOMYCIN  Final   Gram Stain   Final    NO WBC SEEN NO SQUAMOUS EPITHELIAL CELLS SEEN NO ORGANISMS SEEN Performed at [facility]    Culture   Final    METHICILLIN RESISTANT STAPHYLOCOCCUS AUREUS Note: RIFAMPIN AND GENTAMICIN SHOULD NOT BE USED AS SINGLE DRUGS FOR TREATMENT OF STAPH INFECTIONS. ISOLATED FROM ENRICHMENT BROTH ONLY Performed at [facility]    Report Status 12/04/2015 FINAL  Final   Organism ID, Bacteria METHICILLIN RESISTANT STAPHYLOCOCCUS AUREUS  Final      Susceptibility   Methicillin resistant staphylococcus aureus - MIC*  CLINDAMYCIN >=8 RESISTANT Resistant     ERYTHROMYCIN >=8 RESISTANT Resistant     GENTAMICIN <=0.5 SENSITIVE Sensitive     LEVOFLOXACIN >=8 RESISTANT Resistant     OXACILLIN >=4  RESISTANT Resistant     RIFAMPIN <=0.5 SENSITIVE Sensitive     TRIMETH/SULFA <=10 SENSITIVE Sensitive     VANCOMYCIN 1 SENSITIVE Sensitive     TETRACYCLINE <=1 SENSITIVE Sensitive     * METHICILLIN RESISTANT STAPHYLOCOCCUS AUREUS    Medical History: Past Medical History  Diagnosis Date  . MRSA (methicillin resistant Staphylococcus aureus)   . Peritonitis ([place])   . Gastric ulcer   . Stomach ulcer   . Crohn's   . Bowel obstruction ([place])   . Ulcer    Assessment: 25 yo M who had I&D of Left hand on 1/13.  Pharmacy consulted to dose aztreonam/vanc/flagyl for r/o sepsis. Pt reports fever of 103-104 at home. WBC 6.2, creat 0.94, LA 1.46, temp 99.2.  Given solumedrol 125 mg IV x 1 in ED. Levaquin 750 mg IV in ED 1/120 at  1041 Aztreonam 2 gm in ED at 11 am Flagyl 500 mg IV in ED at 1159  Aztreonam 11/20>> vanc 11/20 >> Flagyl 11/20>>  1/20 BCx2>>   Goal of Therapy:  Vancomycin trough level 15-20 mcg/ml  Plan:  - Aztreonam 2 gm in ED f/u by 1 gm IV q8h - flagyl 500 mg IV q8h - Vancomycin 1500 mg IV x 1 dose then vancomycin 1 gm IV q8h  (in past admission was on 1250 q8 and dose reduced to 1 gm q8h with vanc level) - consider narrowing abx per cellulitis order set once sepsis ruled out - f/u renal fxn, wbc, temp, culture data - vanc levels as needed  Eudelia Bunch, Pharm.D. [id number] 12/11/2015 12:22 PM

## 2015-12-11 NOTE — Discharge Instructions (Signed)
Do not hesitate to return to the emergency room for any new, worsening or concerning symptoms.  Please obtain primary care using resource guide below. Let them know that you were seen in the emergency room and that they will need to obtain records for further outpatient management.    Abdominal Pain, Adult Many things can cause abdominal pain. Usually, abdominal pain is not caused by a disease and will improve without treatment. It can often be observed and treated at home. Your health care provider will do a physical exam and possibly order blood tests and X-rays to help determine the seriousness of your pain. However, in many cases, more time must pass before a clear cause of the pain can be found. Before that point, your health care provider may not know if you need more testing or further treatment. HOME CARE INSTRUCTIONS Monitor your abdominal pain for any changes. The following actions may help to alleviate any discomfort you are experiencing:  Only take over-the-counter or prescription medicines as directed by your health care provider.  Do not take laxatives unless directed to do so by your health care provider.  Try a clear liquid diet (broth, tea, or water) as directed by your health care provider. Slowly move to a bland diet as tolerated. SEEK MEDICAL CARE IF:  You have unexplained abdominal pain.  You have abdominal pain associated with nausea or diarrhea.  You have pain when you urinate or have a bowel movement.  You experience abdominal pain that wakes you in the night.  You have abdominal pain that is worsened or improved by eating food.  You have abdominal pain that is worsened with eating fatty foods.  You have a fever. SEEK IMMEDIATE MEDICAL CARE IF:  Your pain does not go away within 2 hours.  You keep throwing up (vomiting).  Your pain is felt only in portions of the abdomen, such as the right side or the left lower portion of the abdomen.  You pass bloody  or black tarry stools. MAKE SURE YOU:  Understand these instructions.  Will watch your condition.  Will get help right away if you are not doing well or get worse.   This information is not intended to replace advice given to you by your health care provider. Make sure you discuss any questions you have with your health care provider.   Document Released: 08/17/2005 Document Revised: 07/29/2015 Document Reviewed: 07/17/2013 Elsevier Interactive Patient Education 2016 [employer].  Emergency Department Resource Guide 1) Find a Doctor and Pay Out of Pocket Although you won't have to find out who is covered by your insurance plan, it is a good idea to ask around and get recommendations. You will then need to call the office and see if the doctor you have chosen will accept you as a new patient and what types of options they offer for patients who are self-pay. Some doctors offer discounts or will set up payment plans for their patients who do not have insurance, but you will need to ask so you aren't surprised when you get to your appointment.  2) Contact Your Local Health Department Not all health departments have doctors that can see patients for sick visits, but many do, so it is worth a call to see if yours does. If you don't know where your local health department is, you can check in your phone book. The CDC also has a tool to help you locate your state's health department, and many state websites also  have listings of all of their local health departments.  3) Find a [facility] If your illness is not likely to be very severe or complicated, you may want to try a walk in clinic. These are popping up all over the country in pharmacies, drugstores, and shopping centers. They're usually staffed by nurse practitioners or physician assistants that have been trained to treat common illnesses and complaints. They're usually fairly quick and inexpensive. However, if you have serious medical  issues or chronic medical problems, these are probably not your best option.  No Primary Care Doctor: - Call Health Connect at  [phone] - they can help you locate a primary care doctor that  accepts your insurance, provides certain services, etc. - Physician Referral Service- [phone]  Chronic Pain Problems: Organization         Address  Phone   Notes  [facility]  [phone] Patients need to be referred by their primary care doctor.   Medication Assistance: Organization         Address  Phone   Notes  [facility] Medication [facility] [address] [place], [place] [address] [phone] --Must be a resident of [facility] -- Must have NO insurance coverage whatsoever (no Medicaid/ Medicare, etc.) -- The pt. MUST have a primary care doctor that directs their care regularly and follows them in the community   MedAssist  [phone]   [employer]  [phone]    Agencies that provide inexpensive medical care: Organization         Address  Phone   Notes  [place]  [phone]   [facility]    [phone]   [facility] [place], [place] [address] [phone]   [place] [address], [place] [phone]   Planned Parenthood    [phone]   [facility]    [phone]   [place] and [address], [place] Phone:  [phone], Fax:  [phone] Hours of Operation:  9 am - 6 pm, M-F.  Also accepts Medicaid/Medicare and self-pay.  [facility] for [place], [address], [place] Phone: [phone], Fax: [phone]. Hours of Operation:  8:30 am - 5:30 pm, M-F.  Also accepts Medicaid and self-pay.  [facility] [place] [address] [place] Phone: [phone]   [place], [place], [place]  [phone], Ext. [phone] Mondays & Thursdays: 7-9 AM.  First 15 patients are seen on a first come, first serve basis.    [place] Providers:  Organization         Address  Phone   Notes  [facility] [address],  [place] [phone] Also accepts self-pay patients.  [facility] [id number] [place], [place]  [phone]   [place], [address], [place] [phone]   [facility] [address], [place] [phone]   Lucianne Lei [address], [place]   [phone] Only accepts [place] Access [place] patients after they have their name applied to their card.   Self-Pay (no insurance) in [facility]:  [employer]  Notes  Sickle Cell Patients, [place] [phone]   [facility] Urgent Care [place] [phone]   [facility] [place]  [place], [place], [place] [phone]   [facility]/Dr. Osei-Bonsu  [address], [place] or [address], [place] [phone] Phone number for both [place] and [place] locations is the same.  Urgent Medical and [facility] [address], [place] [phone]   [facility] [address] [place] or [address] [phone]   [facility] [address] [place] [phone], phone; [phone], fax Sees patients 1st and 3rd Saturday of every month.  Must not qualify for public or private insurance (i.e. Medicaid, Medicare, [place] Health Choice, Veterans' Benefits)  Household income should be no more than 200% of the poverty level The clinic cannot treat you if you are pregnant or think you are pregnant  Sexually transmitted  diseases are not treated at the clinic.    Dental Care: Organization         Address  Phone  Notes  [facility] Department of [facility] [place] [phone] Accepts children up to age 15 who are enrolled in [place] or [place]; pregnant women with a Medicaid card; and children who have applied for Medicaid or [place] Health Choice, but were declined, whose parents can pay a reduced fee at time of service.  [facility] Department of [facility]  [address], [place] [phone] Accepts children up to age 36 who are enrolled in [place] or [place]; pregnant women with a Medicaid card; and children who have applied for Medicaid or [place] Health Choice, but were declined, whose parents can pay a reduced fee at time of service.  [facility]  [place] [phone] Patients are seen by appointment only. Walk-ins are not accepted. [place] will see patients 55 years of age and older. Monday - Tuesday (8am-5pm) Most Wednesdays (8:30-5pm) $30 per visit, cash only  [facility] Access PRO[facility]  [address], [facility] [phone] Patients are seen by appointment only. Walk-ins are not accepted. [place] will see patients 37 years of age and older. One Wednesday Evening (Monthly: Volunteer Based).  $30 per visit, cash only  [place]  [phone] for adults; Children under age 70, call Graduate Pediatric Dentistry at [phone]. Children aged 67-14, please call [phone] to request a pediatric application.  Dental services are provided in all areas of dental care including fillings, crowns and bridges, complete and partial dentures, implants, gum treatment, root canals, and extractions. Preventive care is also provided. Treatment is provided to both adults and children. Patients are selected via a  lottery and there is often a waiting list.   [facility] [address] [place]  [phone] [email or web address]   [facility] [address] [place], [place] [phone], Ext. [phone] Second and Fourth Thursday of each month, opens at 6:30 AM; Clinic ends at 9 AM.  Patients are seen on a first-come first-served basis, and a limited number are seen during each clinic.   [facility]  [address] Hillard Danker [place], [place] [phone]  Eligibility Requirements You must have lived in [place], [place], or [place] counties for at least the last three months.   You cannot be eligible for state or federal sponsored [employer], including [employer], [place], or [employer].   You generally cannot be eligible for healthcare insurance through your employer.    How to apply: Eligibility screenings are held every Tuesday and Wednesday afternoon from 1:00 pm until 4:00 pm. You do not need an appointment for the interview!  [facility] [address] [place], [place]   [place]  [place] Department  [place]  [phone]    Behavioral Health Resources in the Community: Intensive Outpatient Programs Organization         Address  Phone  Notes  [place]. [address] [place], [place] [phone]   [facility] Outpatient [address], [place], [place]   ADS: Alcohol & Drug Svcs [address] [place], [place]   [place] [address],  [place], [place] or [phone]   Substance Abuse Resources Organization         Address  Phone  Notes  Alcohol and Drug Services  [phone]   [place]  [phone]   [facility] [place]   Chinita Pester  [phone]   Residential &  Outpatient Substance Abuse Program  [phone]   Psychological Services Organization         Address  Phone  Notes  [facility]  [place]  [phone]   [place] [address], [place] or [phone]    Mobile Crisis Teams Organization         Address  Phone  Notes  [facility], Mobile Crisis Care Unit  [phone]   [facility]  [address] [place], [place]   [place] [address] [place] [phone]    Self-Help/Support Groups Organization         Address  Phone             Notes  [place]. of [place] - variety of support groups  [place] Call for more information  Narcotics Anonymous (NA), [facility] [address], [employer] [place]  2 meetings at this location   Special educational needs teacher         Address  Phone  Notes  [facility] Residential Treatment [place],    [place]  [phone]   [facility]  [address], [place] [id number], [place], [place]   [place], [place] [phone] Admissions: 8am-3pm M-F  Incentives Substance [place] [address]    [place], [place] [phone]   [facility] [address] Jadene Pierini [place], [place]   [facility] [address]  [place], [place]   Insight Programs - Intensive Outpatient [address] Kristeen [address], [place], [place]   [facility] ([place].) [place].,  [place], [place] or [phone]   [facility] (RTS) [address], [place], [place] Accepts Medicaid  [facility] [place] [address]  [place] 1-539-716-4152 Substance Abuse/Addiction Treatment   [facility] Resources Organization  Address  Phone  Notes  [facility]  [phone]   Domenic Schwab, PhD [address] Arlis Porta [place], [place]   [phone] or [phone]   [place], [place] [phone]   [place] [address], [place], [place] [phone] Insurance/Medicaid/sponsorship through [facility] [place] and Families [address]                                    [place], [place] [phone] [place] [address], [place] [phone]    Dr. Adele Schilder  [phone]   [facility] [facility] Dept. 1) [address] [place] 2) [place] 3)  [address], [place] [phone]  [phone]   [place] [phone] or [phone] (After Hours)

## 2015-12-11 NOTE — ED Notes (Signed)
Pt reports surgery on left hand on Sunday, reports n/v and fever that began yesterday, pt reports fevers 103-104, reports he was able to take 1g tylenol yesterday and keep it down. Pt also reports numbness and tingling in left fingers.

## 2015-12-11 NOTE — ED Notes (Signed)
Patient transported to CT 

## 2015-12-11 NOTE — Progress Notes (Signed)
Orthopedic Tech Progress Note Patient Details:  Travis Callahan Oct 28, 1991 [id number]  Ortho Devices Type of Ortho Device: Ace wrap, Volar splint Ortho Device/Splint Interventions: Application   [name] 12/11/2015, 11:40 AM

## 2015-12-11 NOTE — ED Provider Notes (Signed)
CSN: [id number]     Arrival date & time 12/11/15  [id number] History   First MD Initiated Contact with Patient 12/11/15 [phone]     Chief Complaint  Patient presents with  . Post-op Problem  . Emesis     (Consider location/radiation/quality/duration/timing/severity/associated sxs/prior Treatment) HPI   Blood pressure 153/79, pulse 130, temperature 99 F (37.2 C), temperature source Oral, resp. rate 22, height 5\' 9"  (1.753 m), weight 72.576 kg, SpO2 100 %.  Travis Callahan is a 25 y.o. male with past medical history significant for Crohn's, IV drug use, hepatitis C recently admitted for washout of abscess with infectious tenosynovitis to left hand. Patient reporting acute onset of abdominal pain associated with a popping sensation last night. Patient had appointment with [facility] for recheck today, he spiked a fever of 103 orally measured at 10 PM last night with multiple episodes of emesis, he also has severe right-sided upper and lower abdominal pain in addition to sensation of burning to left second through 5th digits onset yesterday.  Patient endorses diarrhea which is typical for his Crohn's, has not noticed if it's bloody  Past Medical History  Diagnosis Date  . MRSA (methicillin resistant Staphylococcus aureus)   . Peritonitis ([place])   . Gastric ulcer   . Stomach ulcer   . Crohn's   . Bowel obstruction ([place])   . Ulcer    Past Surgical History  Procedure Laterality Date  . Gastric ulcer perforation      repair   . Incision and drainage of wound Right 10/11/2015    Procedure: IRRIGATION AND DEBRIDEMENT right upper arm;  Surgeon: Roseanne Kaufman, MD;  Location: [facility];  Service: Orthopedics;  Laterality: Right;  . Stomach surgery    . Appendectomy    . Abdominal surgery    . I&d extremity Right 11/03/2015    Procedure: IRRIGATION AND DEBRIDEMENT RIGHT ELBOW ;  Surgeon: Roseanne Kaufman, MD;  Location: [place];  Service: Orthopedics;  Laterality: Right;  . Irrigation and debridement  abscess Left 11/03/2015    Procedure: IRRIGATION AND DEBRIDEMENT LEFT HAND ABSCESS;  Surgeon: Roseanne Kaufman, MD;  Location: [place];  Service: Orthopedics;  Laterality: Left;  . I&d extremity Left 11/17/2015    Procedure: IRRIGATION AND DEBRIDEMENT LEFT HAND;  Surgeon: Roseanne Kaufman, MD;  Location: [place];  Service: Orthopedics;  Laterality: Left;  . I&d extremity Left 11/19/2015    Procedure: IRRIGATION AND DEBRIDEMENT EXTREMITY;  Surgeon: Roseanne Kaufman, MD;  Location: [place];  Service: Orthopedics;  Laterality: Left;  . I&d extremity Left 11/28/2015    Procedure: IRRIGATION AND DEBRIDEMENT HAND;  Surgeon: Roseanne Kaufman, MD;  Location: [place];  Service: Orthopedics;  Laterality: Left;  . I&d extremity Left 11/30/2015    Procedure: IRRIGATION AND DEBRIDEMENT OF LEFT HAND;  Surgeon: Roseanne Kaufman, MD;  Location: [place];  Service: Orthopedics;  Laterality: Left;  . Application of wound vac Left 11/30/2015    Procedure: APPLICATION OF WOUND VAC;  Surgeon: Roseanne Kaufman, MD;  Location: [place];  Service: Orthopedics;  Laterality: Left;  . I&d extremity Left 12/03/2015    Procedure: IRRIGATION AND DEBRIDEMENT EXTREMITY;  Surgeon: Roseanne Kaufman, MD;  Location: [place];  Service: Orthopedics;  Laterality: Left;   Family History  Problem Relation Age of Onset  . Heart failure Mother   . Congestive Heart Failure Mother    Social History  Substance Use Topics  . Smoking status: Current Every Day Smoker -- 0.25 packs/day    Types: Cigarettes  .  Smokeless tobacco: None  . Alcohol Use: No     Comment: socially    Review of Systems  10 systems reviewed and found to be negative, except as noted in the HPI.   Allergies  Penicillins; Nsaids; Ivp dye; Ketorolac tromethamine; Morphine and related; and Tramadol  Home Medications   Prior to Admission medications   Medication Sig Start Date End Date Taking? Authorizing Provider  acetaminophen (TYLENOL) 500 MG tablet Take 1,000 mg by mouth 2 (two) times daily  as needed for moderate pain.   Yes Historical Provider, MD  ALPRAZolam Duanne Moron) 0.5 MG tablet Take 1 tablet (0.5 mg total) by mouth 2 (two) times daily. 12/05/15  Yes Roseanne Kaufman, MD  oxyCODONE (OXY IR/ROXICODONE) 5 MG immediate release tablet Take 1-2 tablets (5-10 mg total) by mouth every 6 (six) hours as needed for moderate pain. 12/05/15  Yes Roseanne Kaufman, MD  dicyclomine (BENTYL) 10 MG capsule Take 2 capsules (20 mg total) by mouth 4 (four) times daily as needed for spasms. 12/11/15   Sugey Trevathan, PA-C  ondansetron (ZOFRAN) 4 MG tablet Take 1 tablet (4 mg total) by mouth every 8 (eight) hours as needed for nausea or vomiting. 12/11/15   Elmyra Ricks Less Woolsey, PA-C   BP 120/68 mmHg  Pulse 87  Temp(Src) 98.8 F (37.1 C) (Oral)  Resp 14  Ht 5\' 9"  (1.753 m)  Wt 72.576 kg  BMI 23.62 kg/m2  SpO2 100% Physical Exam  Constitutional: He is oriented to person, place, and time. He appears well-developed and well-nourished. No distress.  HENT:  Head: Normocephalic and atraumatic.  Mouth/Throat: Oropharynx is clear and moist.  Eyes: Conjunctivae and EOM are normal. Pupils are equal, round, and reactive to light.  Neck: Normal range of motion.  Cardiovascular: Regular rhythm and intact distal pulses.   Tachycardic in the 130s  Pulmonary/Chest: Effort normal and breath sounds normal. No respiratory distress. He has no wheezes. He has no rales. He exhibits no tenderness.  Abdominal: Soft. He exhibits no distension. There is tenderness. There is guarding.  Midline remote surgical scar to abdomen from xiphoid to pubis  Hyperactive bowel sounds, exquisitely tender to palpation in the right upper and right lower quadrant with voluntary guarding.   Musculoskeletal: Normal range of motion.  No significant swelling to left hand/fingers. Surgical wound clean dry and intact. Slightly reduced range of motion secondary to pain. Patient is grossly distally neurovascularly intact  Neurological: He is alert  and oriented to person, place, and time.  Skin: He is not diaphoretic.  Psychiatric: He has a normal mood and affect.  Nursing note and vitals reviewed.   ED Course  Procedures (including critical care time)  SPLINT APPLICATION Date/Time: 0000000 PM Authorized by: Monico Blitz Consent: Verbal consent obtained. Risks and benefits: risks, benefits and alternatives were discussed Consent given by: patient Splint applied by: orthopedic technician Location details: Left wrist  Splint type: Volar  Supplies used: Ortho-Glass  Post-procedure: The splinted body part was neurovascularly unchanged following the procedure. Patient tolerance: Patient tolerated the procedure well with no immediate complications.    Labs Review Labs Reviewed  COMPREHENSIVE METABOLIC PANEL - Abnormal; Notable for the following:    Glucose, Bld 102 (*)    All other components within normal limits  CBC WITH DIFFERENTIAL/PLATELET - Abnormal; Notable for the following:    RBC 4.17 (*)    Hemoglobin 11.9 (*)    HCT 35.9 (*)    All other components within normal limits  I-STAT CG4 LACTIC ACID,  ED - Abnormal; Notable for the following:    Lactic Acid, Venous 2.83 (*)    All other components within normal limits  CULTURE, BLOOD (ROUTINE X 2)  CULTURE, BLOOD (ROUTINE X 2)  URINE CULTURE  C DIFFICILE QUICK SCREEN W PCR REFLEX  GASTROINTESTINAL PANEL BY PCR, STOOL (REPLACES STOOL CULTURE)  URINALYSIS, ROUTINE W REFLEX MICROSCOPIC (NOT AT [facility])  I-STAT CG4 LACTIC ACID, ED    Imaging Review Ct Abdomen Pelvis W Contrast  12/11/2015  CLINICAL DATA:  Abdominal pain and fever. Pain is right-sided and began this morning. EXAM: CT ABDOMEN AND PELVIS WITH CONTRAST TECHNIQUE: Multidetector CT imaging of the abdomen and pelvis was performed using the standard protocol following bolus administration of intravenous contrast. CONTRAST:  138mL OMNIPAQUE IOHEXOL 300 MG/ML  SOLN COMPARISON:  05/26/2015 FINDINGS: Lower chest  and abdominal wall: Laparotomy scar. No hernia. Lower chest is negative. Hepatobiliary: No focal liver abnormality.No evidence of biliary obstruction or stone. Pancreas: Unremarkable. Spleen: Unremarkable. Adrenals/Urinary Tract: Negative adrenals. There are few punctate densities at the papillary tips of the right kidney on coronal reformats which may be early excretion of contrast given uniform appearance. No hydronephrosis or ureteral calculus. Unremarkable bladder. Reproductive:No pathologic findings. Stomach/Bowel:  No obstruction. Appendectomy. Vascular/Lymphatic: No acute vascular abnormality. No mass or adenopathy. Peritoneal: Nonspecific trace pelvic fluid. No primary inflammatory source identified. Musculoskeletal: No acute abnormalities. IMPRESSION: No explanation for acute pain. Electronically Signed   By: Monte Fantasia M.D.   On: 12/11/2015 14:08   Dg Chest Port 1 View  12/11/2015  CLINICAL DATA:  Sepsis EXAM: PORTABLE CHEST 1 VIEW COMPARISON:  May 06, 2012 FINDINGS: No edema or consolidation. The heart size and pulmonary vascularity are normal. No adenopathy. No bone lesions. IMPRESSION: No edema or consolidation. Electronically Signed   By: Lowella Grip III M.D.   On: 12/11/2015 10:38   Dg Hand Complete Left  12/11/2015  CLINICAL DATA:  Left hand infection. EXAM: LEFT HAND - COMPLETE 3+ VIEW COMPARISON:  November 28, 2015. FINDINGS: There is no evidence of fracture or dislocation. There is no evidence of arthropathy or other focal bone abnormality. Dressing is seen overlying dorsal soft tissues. No radiopaque foreign body is noted. IMPRESSION: No fracture or dislocation is noted. No radiopaque foreign body is seen. Electronically Signed   By: Marijo Conception, M.D.   On: 12/11/2015 11:26   I have personally reviewed and evaluated these images and lab results as part of my medical decision-making.   EKG Interpretation None      MDM   Final diagnoses:  Abdominal pain, unspecified  abdominal location  Non-intractable vomiting without nausea, vomiting of unspecified type    Filed Vitals:   12/11/15 1301 12/11/15 1403 12/11/15 1415 12/11/15 1439  BP: 111/62 120/68 132/70 120/68  Pulse: 88 86 72 87  Temp: 98.8 F (37.1 C)     TempSrc: Oral     Resp: 16 14  14   Height:      Weight:      SpO2: 99% 99% 100% 100%    Medications  aztreonam (AZACTAM) 1 g in dextrose 5 % 50 mL IVPB (not administered)  metroNIDAZOLE (FLAGYL) IVPB 500 mg (not administered)  vancomycin (VANCOCIN) IVPB 1000 mg/200 mL premix (not administered)  sodium chloride 0.9 % bolus 1,000 mL (0 mLs Intravenous Stopped 12/11/15 1452)    Followed by  sodium chloride 0.9 % bolus 500 mL (0 mLs Intravenous Stopped 12/11/15 1452)  aztreonam (AZACTAM) 2 g in dextrose 5 %  50 mL IVPB (0 g Intravenous Stopped 12/11/15 1204)  metroNIDAZOLE (FLAGYL) IVPB 500 mg (0 mg Intravenous Stopped 12/11/15 1303)  vancomycin (VANCOCIN) 1,500 mg in sodium chloride 0.9 % 500 mL IVPB (0 mg Intravenous Stopped 12/11/15 1453)  ondansetron (ZOFRAN) injection 4 mg (4 mg Intravenous Given 12/11/15 1052)  methylPREDNISolone sodium succinate (SOLU-MEDROL) 125 mg/2 mL injection 125 mg (125 mg Intravenous Given 12/11/15 1202)  diphenhydrAMINE (BENADRYL) injection 50 mg (50 mg Intravenous Given 12/11/15 1203)  HYDROmorphone (DILAUDID) injection 0.5 mg (0.5 mg Intravenous Given 12/11/15 1107)  oxyCODONE (Oxy IR/ROXICODONE) immediate release tablet 10 mg (10 mg Oral Given 12/11/15 1302)  iohexol (OMNIPAQUE) 300 MG/ML solution 100 mL (100 mLs Intravenous Contrast Given 12/11/15 1344)    Gautam R Arnott is 25 y.o. male presenting with abdominal pain, emesis, fever, tachycardia. Patient also had recent washout abscess and infectious tenosynovitis of the left hand. Surgical wound without overt signs of infection. There is no significant swelling to the hand. Patient is initially tachycardic, started on cellulitis sepsis protocol. His abdomen is very  tender, will also CT. Patient will need premedication with IV dye allergy. Patient is pending C. difficile quick strain and GI pathogen by PCR panel.   Chest x-ray negative, x-ray of hand without signs of osteomyelitis, lactic acid is not elevated and he has no leukocytosis. Chest x-ray is clear.  The abdomen pelvis is negative, patient's lactic acid is slightly elevating at 2.83, discussed with attending physician who agrees the patient is stable for discharge. Patient reports he wants to leave the emergency room, he has to pick up his child. Advised him to follow closely with Dr. Veronia Beets.   This is a shared visit with the attending physician who personally evaluated the patient and agrees with the care plan.   Evaluation does not show pathology that would require ongoing emergent intervention or inpatient treatment. Pt is hemodynamically stable and mentating appropriately. Discussed findings and plan with patient/guardian, who agrees with care plan. All questions answered. Return precautions discussed and outpatient follow up given.   Discharge Medication List as of 12/11/2015  2:50 PM    START taking these medications   Details  dicyclomine (BENTYL) 10 MG capsule Take 2 capsules (20 mg total) by mouth 4 (four) times daily as needed for spasms., Starting 12/11/2015, Until Discontinued, Print    ondansetron (ZOFRAN) 4 MG tablet Take 1 tablet (4 mg total) by mouth every 8 (eight) hours as needed for nausea or vomiting., Starting 12/11/2015, Until Discontinued, Print             Monico Blitz, PA-C 12/11/15 [place], MD 12/12/15 [phone]

## 2015-12-12 ENCOUNTER — Encounter (HOSPITAL_COMMUNITY): Payer: Self-pay | Admitting: *Deleted

## 2015-12-12 DIAGNOSIS — R000 Tachycardia, unspecified: Secondary | ICD-10-CM | POA: Insufficient documentation

## 2015-12-12 DIAGNOSIS — D649 Anemia, unspecified: Secondary | ICD-10-CM | POA: Insufficient documentation

## 2015-12-12 DIAGNOSIS — L03114 Cellulitis of left upper limb: Principal | ICD-10-CM | POA: Insufficient documentation

## 2015-12-12 DIAGNOSIS — D473 Essential (hemorrhagic) thrombocythemia: Secondary | ICD-10-CM | POA: Insufficient documentation

## 2015-12-12 DIAGNOSIS — L02512 Cutaneous abscess of left hand: Secondary | ICD-10-CM | POA: Insufficient documentation

## 2015-12-12 DIAGNOSIS — B1920 Unspecified viral hepatitis C without hepatic coma: Secondary | ICD-10-CM | POA: Insufficient documentation

## 2015-12-12 DIAGNOSIS — Z880 Allergy status to penicillin: Secondary | ICD-10-CM | POA: Insufficient documentation

## 2015-12-12 DIAGNOSIS — F17210 Nicotine dependence, cigarettes, uncomplicated: Secondary | ICD-10-CM | POA: Insufficient documentation

## 2015-12-12 DIAGNOSIS — L03113 Cellulitis of right upper limb: Secondary | ICD-10-CM | POA: Insufficient documentation

## 2015-12-12 LAB — URINE CULTURE: CULTURE: NO GROWTH

## 2015-12-12 NOTE — ED Notes (Signed)
Patient here due to fever at home.  Being treated for left hand abcess by Dr Amedeo Plenty

## 2015-12-12 NOTE — ED Notes (Signed)
Patient present stating he has been having a fever as high as 104 at home

## 2015-12-13 ENCOUNTER — Observation Stay (HOSPITAL_COMMUNITY)
Admission: EM | Admit: 2015-12-13 | Discharge: 2015-12-13 | Discharge status: Against Medical Advice | Payer: Self-pay | Attending: Internal Medicine | Admitting: Internal Medicine

## 2015-12-13 DIAGNOSIS — F1910 Other psychoactive substance abuse, uncomplicated: Secondary | ICD-10-CM | POA: Diagnosis present

## 2015-12-13 DIAGNOSIS — D75839 Thrombocytosis, unspecified: Secondary | ICD-10-CM | POA: Diagnosis present

## 2015-12-13 DIAGNOSIS — L03114 Cellulitis of left upper limb: Secondary | ICD-10-CM

## 2015-12-13 DIAGNOSIS — D649 Anemia, unspecified: Secondary | ICD-10-CM

## 2015-12-13 DIAGNOSIS — R1011 Right upper quadrant pain: Secondary | ICD-10-CM

## 2015-12-13 DIAGNOSIS — D473 Essential (hemorrhagic) thrombocythemia: Secondary | ICD-10-CM

## 2015-12-13 DIAGNOSIS — R112 Nausea with vomiting, unspecified: Secondary | ICD-10-CM | POA: Diagnosis present

## 2015-12-13 DIAGNOSIS — L03113 Cellulitis of right upper limb: Secondary | ICD-10-CM | POA: Insufficient documentation

## 2015-12-13 LAB — COMPREHENSIVE METABOLIC PANEL
ALT: 31 U/L (ref 17–63)
ANION GAP: 14 (ref 5–15)
AST: 24 U/L (ref 15–41)
Albumin: 4.2 g/dL (ref 3.5–5.0)
Alkaline Phosphatase: 90 U/L (ref 38–126)
BILIRUBIN TOTAL: 0.5 mg/dL (ref 0.3–1.2)
BUN: 19 mg/dL (ref 6–20)
CHLORIDE: 106 mmol/L (ref 101–111)
CO2: 24 mmol/L (ref 22–32)
Calcium: 10 mg/dL (ref 8.9–10.3)
Creatinine, Ser: 0.97 mg/dL (ref 0.61–1.24)
GFR calc Af Amer: >60 mL/min (ref >60)
Glucose, Bld: 106 mg/dL — ABNORMAL HIGH (ref 65–99)
POTASSIUM: 3.5 mmol/L (ref 3.5–5.1)
Sodium: 144 mmol/L (ref 135–145)
TOTAL PROTEIN: 8.1 g/dL (ref 6.5–8.1)

## 2015-12-13 LAB — CBC WITH DIFFERENTIAL/PLATELET
BASOS ABS: 0 10*3/uL (ref 0.0–0.1)
BASOS PCT: 1 %
EOS PCT: 1 %
Eosinophils Absolute: 0.1 10*3/uL (ref 0.0–0.7)
HEMATOCRIT: 34.9 % — AB (ref 39.0–52.0)
Hemoglobin: 11.5 g/dL — ABNORMAL LOW (ref 13.0–17.0)
LYMPHS PCT: 27 %
Lymphs Abs: 1.9 10*3/uL (ref 0.7–4.0)
MCH: 28.5 pg (ref 26.0–34.0)
MCHC: 33 g/dL (ref 30.0–36.0)
MCV: 86.6 fL (ref 78.0–100.0)
MONO ABS: 0.8 10*3/uL (ref 0.1–1.0)
MONOS PCT: 12 %
NEUTROS ABS: 4.1 10*3/uL (ref 1.7–7.7)
Neutrophils Relative %: 59 %
PLATELETS: 464 10*3/uL — AB (ref 150–400)
RBC: 4.03 MIL/uL — ABNORMAL LOW (ref 4.22–5.81)
RDW: 15.2 % (ref 11.5–15.5)
WBC: 7 10*3/uL (ref 4.0–10.5)

## 2015-12-13 LAB — RAPID URINE DRUG SCREEN, HOSP PERFORMED
Amphetamines: POSITIVE — AB
Barbiturates: NOT DETECTED
Benzodiazepines: POSITIVE — AB
COCAINE: NOT DETECTED
OPIATES: POSITIVE — AB
TETRAHYDROCANNABINOL: POSITIVE — AB

## 2015-12-13 LAB — URINALYSIS, ROUTINE W REFLEX MICROSCOPIC
Bilirubin Urine: NEGATIVE
GLUCOSE, UA: NEGATIVE mg/dL
Hgb urine dipstick: NEGATIVE
KETONES UR: NEGATIVE mg/dL
LEUKOCYTES UA: NEGATIVE
NITRITE: NEGATIVE
Protein, ur: NEGATIVE mg/dL
Specific Gravity, Urine: 1.038 — ABNORMAL HIGH (ref 1.005–1.030)
pH: 6 (ref 5.0–8.0)

## 2015-12-13 LAB — I-STAT CG4 LACTIC ACID, ED
LACTIC ACID, VENOUS: 1.53 mmol/L (ref 0.5–2.0)
Lactic Acid, Venous: 1 mmol/L (ref 0.5–2.0)

## 2015-12-13 MED ORDER — HYDROMORPHONE HCL 1 MG/ML IJ SOLN
1.0000 mg | Freq: Once | INTRAMUSCULAR | Status: AC
  Administered 2015-12-13: 1 mg via INTRAVENOUS
  Filled 2015-12-13: qty 1

## 2015-12-13 MED ORDER — SODIUM CHLORIDE 0.9 % IV SOLN
1000.0000 mL | Freq: Once | INTRAVENOUS | Status: AC
  Administered 2015-12-13: 1000 mL via INTRAVENOUS

## 2015-12-13 MED ORDER — VANCOMYCIN HCL IN DEXTROSE 1-5 GM/200ML-% IV SOLN
1000.0000 mg | Freq: Three times a day (TID) | INTRAVENOUS | Status: DC
  Administered 2015-12-13: 1000 mg via INTRAVENOUS
  Filled 2015-12-13 (×2): qty 200

## 2015-12-13 MED ORDER — ENOXAPARIN SODIUM 40 MG/0.4ML ~~LOC~~ SOLN
40.0000 mg | Freq: Every day | SUBCUTANEOUS | Status: DC
  Filled 2015-12-13: qty 0.4

## 2015-12-13 MED ORDER — ONDANSETRON HCL 4 MG/2ML IJ SOLN
4.0000 mg | Freq: Four times a day (QID) | INTRAMUSCULAR | Status: DC | PRN
  Filled 2015-12-13: qty 2

## 2015-12-13 MED ORDER — ONDANSETRON HCL 4 MG PO TABS: 4.0000 mg | ORAL_TABLET | Freq: Four times a day (QID) | ORAL | Status: DC | PRN

## 2015-12-13 MED ORDER — OXYCODONE HCL 5 MG PO TABS
5.0000 mg | ORAL_TABLET | ORAL | Status: DC | PRN
Start: 2015-12-13 — End: 2015-12-13

## 2015-12-13 MED ORDER — ONDANSETRON HCL 4 MG/2ML IJ SOLN: 4.0000 mg | Freq: Once | INTRAMUSCULAR | Status: DC

## 2015-12-13 MED ORDER — ALPRAZOLAM 0.5 MG PO TABS: 0.5000 mg | ORAL_TABLET | Freq: Two times a day (BID) | ORAL | Status: DC | PRN

## 2015-12-13 MED ORDER — SODIUM CHLORIDE 0.9 % IV SOLN
1000.0000 mL | INTRAVENOUS | Status: DC
  Administered 2015-12-13: 1000 mL via INTRAVENOUS

## 2015-12-13 MED ORDER — DICYCLOMINE HCL 10 MG PO CAPS: 20.0000 mg | ORAL_CAPSULE | Freq: Four times a day (QID) | ORAL | Status: DC | PRN

## 2015-12-13 MED ORDER — ACETAMINOPHEN 325 MG PO TABS: 650.0000 mg | ORAL_TABLET | ORAL | Status: DC | PRN

## 2015-12-13 MED ORDER — ONDANSETRON HCL 4 MG/2ML IJ SOLN
4.0000 mg | Freq: Once | INTRAMUSCULAR | Status: AC
  Administered 2015-12-13: 4 mg via INTRAVENOUS
  Filled 2015-12-13: qty 2

## 2015-12-13 MED ORDER — VANCOMYCIN HCL IN DEXTROSE 1-5 GM/200ML-% IV SOLN
1000.0000 mg | Freq: Once | INTRAVENOUS | Status: AC
  Administered 2015-12-13: 1000 mg via INTRAVENOUS
  Filled 2015-12-13: qty 200

## 2015-12-13 MED ORDER — OXYCODONE HCL 5 MG PO TABS
5.0000 mg | ORAL_TABLET | Freq: Four times a day (QID) | ORAL | Status: DC | PRN
  Administered 2015-12-13: 10 mg via ORAL
  Filled 2015-12-13: qty 2

## 2015-12-13 NOTE — Progress Notes (Signed)
ANTIBIOTIC CONSULT NOTE - INITIAL  Pharmacy Consult for Vancomycin  Indication: cellulitis  Allergies  Allergen Reactions  . Penicillins Other (See Comments)    HAS TOLERATED CEFAZOLIN WITHOUT PROBLEM Childhood reaction Has patient had a PCN reaction causing immediate rash, facial/tongue/throat swelling, SOB or lightheadedness with hypotension: Unknown Has patient had a PCN reaction causing severe rash involving mucus membranes or skin necrosis: No Has patient had a PCN reaction that required hospitalization No Has patient had a PCN reaction occurring within the last 10 years: No If all of the above answers are "NO", then may proceed with Cephalosporin [place]  . Nsaids Other (See Comments)    Ulcers   . Ivp Dye [Iodinated Diagnostic Agents] Hives, Itching and Other (See Comments)    Needs solumedrol and benadryl prior  . Ketorolac Tromethamine Hives, Itching and Other (See Comments)    STOMACH ULCER  . Morphine And Related Hives and Itching  . Tramadol Nausea And Vomiting    Ulcer     Patient Measurements: Height: 5\' 9"  (175.3 cm) Weight: 160 lb (72.576 kg) IBW/kg (Calculated) : 70.7  Vital Signs: Temp: 98.6 F (37 C) (01/22 0158) Temp Source: Oral (01/22 0158) BP: 125/61 mmHg (01/22 0301) Pulse Rate: 111 (01/22 0301) Intake/Output from previous day:   Intake/Output from this shift:    Labs:  Recent Labs  12/11/15 1045 12/13/15 0026  WBC 6.2 7.0  HGB 11.9* 11.5*  PLT 381 464*  CREATININE 0.94 0.97   Estimated Creatinine Clearance: 116.4 mL/min (by C-G formula based on Cr of 0.97). No results for input(s): VANCOTROUGH, VANCOPEAK, VANCORANDOM, GENTTROUGH, GENTPEAK, GENTRANDOM, TOBRATROUGH, TOBRAPEAK, TOBRARND, AMIKACINPEAK, AMIKACINTROU, AMIKACIN in the last 72 hours.   Microbiology: Recent Results (from the past 720 hour(s))  Surgical pcr screen     Status: Abnormal   Collection Time: 11/16/15  9:15 PM  Result Value Ref Range Status   MRSA, PCR NEGATIVE  NEGATIVE Final   Staphylococcus aureus POSITIVE (A) NEGATIVE Final    Comment:        The Xpert SA Assay (FDA approved for NASAL specimens in patients over 53 years of age), is one component of a comprehensive surveillance program.  Test performance has been validated by [facility] for patients greater than or equal to 66 year old. It is not intended to diagnose infection nor to guide or monitor treatment.   Culture, blood (routine x 2)     Status: None   Collection Time: 11/27/15  4:07 PM  Result Value Ref Range Status   Specimen Description BLOOD RIGHT HAND  Final   Special Requests BOTTLES DRAWN AEROBIC AND ANAEROBIC 4 ML  Final   Culture   Final    NO GROWTH 5 DAYS Performed at [facility]    Report Status 12/02/2015 FINAL  Final  Culture, blood (routine x 2)     Status: None   Collection Time: 11/27/15  4:30 PM  Result Value Ref Range Status   Specimen Description BLOOD RIGHT HAND  Final   Special Requests BOTTLES DRAWN AEROBIC AND ANAEROBIC 5 CC  Final   Culture   Final    NO GROWTH 5 DAYS Performed at [facility]    Report Status 12/02/2015 FINAL  Final  Wound culture     Status: None   Collection Time: 11/27/15  8:54 PM  Result Value Ref Range Status   Specimen Description WOUND LEFT HAND  Final   Special Requests Normal  Final   Gram Stain  Final    MODERATE WBC PRESENT,BOTH PMN AND MONONUCLEAR NO SQUAMOUS EPITHELIAL CELLS SEEN MODERATE GRAM POSITIVE COCCI IN PAIRS Performed at [facility]    Culture   Final    ABUNDANT METHICILLIN RESISTANT STAPHYLOCOCCUS AUREUS DUMFER 11/29/14 1100 BY SMITHERSJ Note: RIFAMPIN AND GENTAMICIN SHOULD NOT BE USED AS SINGLE DRUGS FOR TREATMENT OF STAPH INFECTIONS. CRITICAL RESULT CALLED TO, READ BACK BY AND VERIFIED WITH: KAREN LOWE Performed at [facility]    Report Status 11/30/2015 FINAL  Final   Organism ID, Bacteria METHICILLIN RESISTANT STAPHYLOCOCCUS AUREUS  Final      Susceptibility    Methicillin resistant staphylococcus aureus - MIC*    CLINDAMYCIN >=8 RESISTANT Resistant     ERYTHROMYCIN >=8 RESISTANT Resistant     GENTAMICIN <=0.5 SENSITIVE Sensitive     LEVOFLOXACIN >=8 RESISTANT Resistant     OXACILLIN >=4 RESISTANT Resistant     RIFAMPIN <=0.5 SENSITIVE Sensitive     TRIMETH/SULFA <=10 SENSITIVE Sensitive     VANCOMYCIN 1 SENSITIVE Sensitive     TETRACYCLINE <=1 SENSITIVE Sensitive     * ABUNDANT METHICILLIN RESISTANT STAPHYLOCOCCUS AUREUS  Anaerobic culture     Status: None   Collection Time: 11/27/15  8:54 PM  Result Value Ref Range Status   Specimen Description WOUND LEFT HAND  Final   Special Requests Normal  Final   Gram Stain   Final    NO WBC SEEN NO SQUAMOUS EPITHELIAL CELLS SEEN NO ORGANISMS SEEN Performed at [facility]    Culture   Final    NO ANAEROBES ISOLATED Performed at [facility]    Report Status 12/03/2015 FINAL  Final  Fungus Culture with Smear     Status: None (Preliminary result)   Collection Time: 11/27/15  8:54 PM  Result Value Ref Range Status   Specimen Description HAND LEFT  Final   Special Requests NONE  Final   Fungal Smear   Final    NO YEAST OR FUNGAL ELEMENTS SEEN Performed at [facility]    Culture   Final    CULTURE IN PROGRESS FOR FOUR WEEKS Performed at [facility]    Report Status PENDING  Incomplete  MRSA PCR Screening     Status: None   Collection Time: 11/28/15 12:35 AM  Result Value Ref Range Status   MRSA by PCR NEGATIVE NEGATIVE Final    Comment:        The GeneXpert MRSA Assay (FDA approved for NASAL specimens only), is one component of a comprehensive MRSA colonization surveillance program. It is not intended to diagnose MRSA infection nor to guide or monitor treatment for MRSA infections.   Anaerobic culture     Status: None   Collection Time: 11/28/15 12:27 PM  Result Value Ref Range Status   Specimen Description WOUND LEFT HAND  Final   Special  Requests NONE  Final   Gram Stain   Final    MODERATE WBC PRESENT,BOTH PMN AND MONONUCLEAR NO SQUAMOUS EPITHELIAL CELLS SEEN NO ORGANISMS SEEN Performed at [facility]    Culture   Final    NO ANAEROBES ISOLATED Performed at [facility]    Report Status 12/03/2015 FINAL  Final  Wound culture     Status: None   Collection Time: 11/28/15 12:27 PM  Result Value Ref Range Status   Specimen Description WOUND LEFT HAND  Final   Special Requests NONE  Final   Gram Stain   Final  ABUNDANT WBC PRESENT,BOTH PMN AND MONONUCLEAR NO SQUAMOUS EPITHELIAL CELLS SEEN NO ORGANISMS SEEN Performed at [facility]    Culture   Final    MODERATE METHICILLIN RESISTANT STAPHYLOCOCCUS AUREUS Note: RIFAMPIN AND GENTAMICIN SHOULD NOT BE USED AS SINGLE DRUGS FOR TREATMENT OF STAPH INFECTIONS. CRITICAL RESULT CALLED TO, READ BACK BY AND VERIFIED WITH: ADRIENNE RN ON 5N AT 0820 [id number] BY CASTC Performed at [facility]    Report Status 12/01/2015 FINAL  Final   Organism ID, Bacteria METHICILLIN RESISTANT STAPHYLOCOCCUS AUREUS  Final      Susceptibility   Methicillin resistant staphylococcus aureus - MIC*    CLINDAMYCIN >=8 RESISTANT Resistant     ERYTHROMYCIN >=8 RESISTANT Resistant     GENTAMICIN <=0.5 SENSITIVE Sensitive     LEVOFLOXACIN >=8 RESISTANT Resistant     OXACILLIN >=4 RESISTANT Resistant     RIFAMPIN <=0.5 SENSITIVE Sensitive     TRIMETH/SULFA <=10 SENSITIVE Sensitive     VANCOMYCIN 1 SENSITIVE Sensitive     TETRACYCLINE <=1 SENSITIVE Sensitive     * MODERATE METHICILLIN RESISTANT STAPHYLOCOCCUS AUREUS  Fungus Culture with Smear     Status: None (Preliminary result)   Collection Time: 11/28/15 12:27 PM  Result Value Ref Range Status   Specimen Description WOUND LEFT HAND  Final   Special Requests NONE  Final   Fungal Smear   Final    NO YEAST OR FUNGAL ELEMENTS SEEN Performed at [facility]    Culture   Final    CULTURE IN PROGRESS FOR  FOUR WEEKS Performed at [facility]    Report Status PENDING  Incomplete  Anaerobic culture     Status: None   Collection Time: 11/28/15 12:51 PM  Result Value Ref Range Status   Specimen Description TISSUE LEFT HAND  Final   Special Requests POF VANCOMYCIN  Final   Gram Stain   Final    MODERATE WBC PRESENT,BOTH PMN AND MONONUCLEAR NO SQUAMOUS EPITHELIAL CELLS SEEN MODERATE GRAM POSITIVE COCCI IN PAIRS Performed at [facility]    Culture   Final    NO ANAEROBES ISOLATED Performed at [facility]    Report Status 12/03/2015 FINAL  Final  Fungus Culture with Smear     Status: None (Preliminary result)   Collection Time: 11/28/15 12:51 PM  Result Value Ref Range Status   Specimen Description TISSUE LEFT HAND  Final   Special Requests POF VANCOMYCIN  Final   Fungal Smear   Final    NO YEAST OR FUNGAL ELEMENTS SEEN Performed at [facility]    Culture   Final    CULTURE IN PROGRESS FOR FOUR WEEKS Performed at [facility]    Report Status PENDING  Incomplete  Tissue culture     Status: None   Collection Time: 11/28/15 12:51 PM  Result Value Ref Range Status   Specimen Description TISSUE LEFT HAND  Final   Special Requests POF VANCOMYCIN  Final   Gram Stain   Final    NO WBC SEEN NO SQUAMOUS EPITHELIAL CELLS SEEN NO ORGANISMS SEEN Performed at [facility]    Culture   Final    METHICILLIN RESISTANT STAPHYLOCOCCUS AUREUS Note: RIFAMPIN AND GENTAMICIN SHOULD NOT BE USED AS SINGLE DRUGS FOR TREATMENT OF STAPH INFECTIONS. ISOLATED FROM ENRICHMENT BROTH ONLY Performed at [facility]    Report Status 12/04/2015 FINAL  Final   Organism ID, Bacteria METHICILLIN RESISTANT STAPHYLOCOCCUS AUREUS  Final  Susceptibility   Methicillin resistant staphylococcus aureus - MIC*    CLINDAMYCIN >=8 RESISTANT Resistant     ERYTHROMYCIN >=8 RESISTANT Resistant     GENTAMICIN <=0.5 SENSITIVE Sensitive     LEVOFLOXACIN >=8  RESISTANT Resistant     OXACILLIN >=4 RESISTANT Resistant     RIFAMPIN <=0.5 SENSITIVE Sensitive     TRIMETH/SULFA <=10 SENSITIVE Sensitive     VANCOMYCIN 1 SENSITIVE Sensitive     TETRACYCLINE <=1 SENSITIVE Sensitive     * METHICILLIN RESISTANT STAPHYLOCOCCUS AUREUS  Blood Culture (routine x 2)     Status: None (Preliminary result)   Collection Time: 12/11/15 10:45 AM  Result Value Ref Range Status   Specimen Description BLOOD BLOOD RIGHT FOREARM  Final   Special Requests BOTTLES DRAWN AEROBIC AND ANAEROBIC 5CC  Final   Culture NO GROWTH 1 DAY  Final   Report Status PENDING  Incomplete  Blood Culture (routine x 2)     Status: None (Preliminary result)   Collection Time: 12/11/15 10:53 AM  Result Value Ref Range Status   Specimen Description BLOOD RIGHT HAND  Final   Special Requests BOTTLES DRAWN AEROBIC AND ANAEROBIC 5CC  Final   Culture NO GROWTH 1 DAY  Final   Report Status PENDING  Incomplete  Urine culture     Status: None   Collection Time: 12/11/15  2:11 PM  Result Value Ref Range Status   Specimen Description URINE, CLEAN CATCH  Final   Special Requests NONE  Final   Culture NO GROWTH 1 DAY  Final   Report Status 12/12/2015 FINAL  Final    Medical History: Past Medical History  Diagnosis Date  . MRSA (methicillin resistant Staphylococcus aureus)   . Peritonitis ([place])   . Gastric ulcer   . Stomach ulcer   . Crohn's   . Bowel obstruction ([place])   . Ulcer     Medications:  Xanax  Bentyl  Zofran  OxyIR  APAP  Assessment: 25 y.o. male with abdominal pain/fever, h/o L hand abcess s/p I & D 1/9, for empiric antibiotics  Vancomycin 1 g IV given in ED at 0400 Received oritavancin 1200 mg IV 1/13 to provide ~ 2 weeks of MRSA coverage  Goal of Therapy:  Vancomycin trough level 10-15 mcg/ml  Plan:  Vancomycin 1 g IV q8h F/U BMet  Karren [place]land, Bronson Curb 12/13/2015,5:39 AM

## 2015-12-13 NOTE — Progress Notes (Signed)
Upon admission, patient asked for leave. He sated that :" I have a Dr. appointment with Dr. Amedeo Plenty tomorrow morning. I have my pain med and antibiotics med at home and there is no reason for me to stay in the hospital."  The consequence for leave against AMA and without correct medical treatment  explained to patient. He signed the AMA form and RN d/c the iv access. Patient left against AMA. The RN reported to Dr. Posey Pronto on the phone about patient left against AMA.

## 2015-12-13 NOTE — ED Notes (Signed)
ADmitting at bedside 

## 2015-12-13 NOTE — ED Provider Notes (Signed)
CSN: [id number]     Arrival date & time 12/12/15  2259 History   By signing my name below, I, Randa Evens, attest that this documentation has been prepared under the direction and in the presence of Delora Fuel, MD. Electronically Signed: Randa Evens, ED Scribe. 12/13/2015. 2:10 AM.     Chief Complaint  Patient presents with  . Post-op Problem   The history is provided by the patient. No language interpreter was used.   HPI Comments: Travis Callahan is a 25 y.o. male who presents to the Emergency Department complaining of fever (max temp 103 PTA) onset 12 hours PTA. Pt reports right sided abdominal pain radiating to his chest, nausea and vomiting. Pt reports tylenol PTA. Pt reports Hx of several surgeries on his left hand abscess. Pt was recently admitted for washout of abscess with infectious tenosynovitis to left hand. Pt states that the fingers in his left hand feel numb and describes the pain as burning sensation. Pt states that he has noticed some drainage coming from the incision on his left hand. Pt reports being on Cipro and flagyl. Pt doesn't report any other symptoms at this time.   Past Medical History  Diagnosis Date  . MRSA (methicillin resistant Staphylococcus aureus)   . Peritonitis ([place])   . Gastric ulcer   . Stomach ulcer   . Crohn's   . Bowel obstruction ([place])   . Ulcer    Past Surgical History  Procedure Laterality Date  . Gastric ulcer perforation      repair   . Incision and drainage of wound Right 10/11/2015    Procedure: IRRIGATION AND DEBRIDEMENT right upper arm;  Surgeon: Roseanne Kaufman, MD;  Location: [facility];  Service: Orthopedics;  Laterality: Right;  . Stomach surgery    . Appendectomy    . Abdominal surgery    . I&d extremity Right 11/03/2015    Procedure: IRRIGATION AND DEBRIDEMENT RIGHT ELBOW ;  Surgeon: Roseanne Kaufman, MD;  Location: [place];  Service: Orthopedics;  Laterality: Right;  . Irrigation and debridement abscess Left 11/03/2015     Procedure: IRRIGATION AND DEBRIDEMENT LEFT HAND ABSCESS;  Surgeon: Roseanne Kaufman, MD;  Location: [place];  Service: Orthopedics;  Laterality: Left;  . I&d extremity Left 11/17/2015    Procedure: IRRIGATION AND DEBRIDEMENT LEFT HAND;  Surgeon: Roseanne Kaufman, MD;  Location: [place];  Service: Orthopedics;  Laterality: Left;  . I&d extremity Left 11/19/2015    Procedure: IRRIGATION AND DEBRIDEMENT EXTREMITY;  Surgeon: Roseanne Kaufman, MD;  Location: [place];  Service: Orthopedics;  Laterality: Left;  . I&d extremity Left 11/28/2015    Procedure: IRRIGATION AND DEBRIDEMENT HAND;  Surgeon: Roseanne Kaufman, MD;  Location: [place];  Service: Orthopedics;  Laterality: Left;  . I&d extremity Left 11/30/2015    Procedure: IRRIGATION AND DEBRIDEMENT OF LEFT HAND;  Surgeon: Roseanne Kaufman, MD;  Location: [place];  Service: Orthopedics;  Laterality: Left;  . Application of wound vac Left 11/30/2015    Procedure: APPLICATION OF WOUND VAC;  Surgeon: Roseanne Kaufman, MD;  Location: [place];  Service: Orthopedics;  Laterality: Left;  . I&d extremity Left 12/03/2015    Procedure: IRRIGATION AND DEBRIDEMENT EXTREMITY;  Surgeon: Roseanne Kaufman, MD;  Location: [place];  Service: Orthopedics;  Laterality: Left;   Family History  Problem Relation Age of Onset  . Heart failure Mother   . Congestive Heart Failure Mother    Social History  Substance Use Topics  . Smoking status: Current Every Day Smoker --  0.25 packs/day    Types: Cigarettes  . Smokeless tobacco: None  . Alcohol Use: No     Comment: socially    Review of Systems  Constitutional: Positive for fever.  Gastrointestinal: Positive for nausea, vomiting and abdominal pain.  Skin: Positive for wound.  All other systems reviewed and are negative.    Allergies  Penicillins; Nsaids; Ivp dye; Ketorolac tromethamine; Morphine and related; and Tramadol  Home Medications   Prior to Admission medications   Medication Sig Start Date End Date Taking? Authorizing Provider   acetaminophen (TYLENOL) 500 MG tablet Take 1,000 mg by mouth 2 (two) times daily as needed for moderate pain.    Historical Provider, MD  ALPRAZolam Duanne Moron) 0.5 MG tablet Take 1 tablet (0.5 mg total) by mouth 2 (two) times daily. 12/05/15   Roseanne Kaufman, MD  dicyclomine (BENTYL) 10 MG capsule Take 2 capsules (20 mg total) by mouth 4 (four) times daily as needed for spasms. 12/11/15   Nicole Pisciotta, PA-C  ondansetron (ZOFRAN) 4 MG tablet Take 1 tablet (4 mg total) by mouth every 8 (eight) hours as needed for nausea or vomiting. 12/11/15   Elmyra Ricks Pisciotta, PA-C  oxyCODONE (OXY IR/ROXICODONE) 5 MG immediate release tablet Take 1-2 tablets (5-10 mg total) by mouth every 6 (six) hours as needed for moderate pain. 12/05/15   Roseanne Kaufman, MD   BP 146/92 mmHg  Pulse 110  Temp(Src) 98.6 F (37 C) (Oral)  Resp 20  Ht 5\' 9"  (1.753 m)  Wt 160 lb (72.576 kg)  BMI 23.62 kg/m2  SpO2 100%   Physical Exam  Constitutional: He is oriented to person, place, and time. He appears well-developed and well-nourished. No distress.  HENT:  Head: Normocephalic and atraumatic.  Eyes: Conjunctivae and EOM are normal. Pupils are equal, round, and reactive to light.  Neck: Normal range of motion. Neck supple. No JVD present.  Cardiovascular: Regular rhythm and normal heart sounds.  Tachycardia present.   No murmur heard. Pulmonary/Chest: Effort normal and breath sounds normal. He has no wheezes. He has no rales. He exhibits no tenderness.  Abdominal: Soft. Bowel sounds are normal. He exhibits no distension and no mass. There is no tenderness.  Musculoskeletal: Normal range of motion. He exhibits tenderness. He exhibits no edema.  Left forearm in a volar splint, finger are slightly dusky and cool with delayed capillary refill, splint removed and surgical incision on dorsum of left hand shows intact suture line with mild erythema no obvious drainage (pt reports drainage at home and dressing recently changed) Marked  pain with any manipulation of left arm.  Lymphadenopathy:    He has no cervical adenopathy.  Neurological: He is alert and oriented to person, place, and time. No cranial nerve deficit. He exhibits normal muscle tone. Coordination normal.  Skin: Skin is warm and dry.  Psychiatric: He has a normal mood and affect. His behavior is normal. Judgment and thought content normal.  Nursing note and vitals reviewed.   ED Course  Procedures (including critical care time) DIAGNOSTIC STUDIES: Oxygen Saturation is 100% on RA, normal by my interpretation.    COORDINATION OF CARE: 2:10 AM-Discussed treatment plan with pt at bedside and pt agreed to plan.     Labs Review Results for orders placed or performed during the hospital encounter of 12/13/15  CBC with Differential  Result Value Ref Range   WBC 7.0 4.0 - 10.5 K/uL   RBC 4.03 (L) 4.22 - 5.81 MIL/uL   Hemoglobin 11.5 (L) 13.0 -  17.0 g/dL   HCT 34.9 (L) 39.0 - 52.0 %   MCV 86.6 78.0 - 100.0 fL   MCH 28.5 26.0 - 34.0 pg   MCHC 33.0 30.0 - 36.0 g/dL   RDW 15.2 11.5 - 15.5 %   Platelets 464 (H) 150 - 400 K/uL   Neutrophils Relative % 59 %   Neutro Abs 4.1 1.7 - 7.7 K/uL   Lymphocytes Relative 27 %   Lymphs Abs 1.9 0.7 - 4.0 K/uL   Monocytes Relative 12 %   Monocytes Absolute 0.8 0.1 - 1.0 K/uL   Eosinophils Relative 1 %   Eosinophils Absolute 0.1 0.0 - 0.7 K/uL   Basophils Relative 1 %   Basophils Absolute 0.0 0.0 - 0.1 K/uL  Comprehensive metabolic panel  Result Value Ref Range   Sodium 144 135 - 145 mmol/L   Potassium 3.5 3.5 - 5.1 mmol/L   Chloride 106 101 - 111 mmol/L   CO2 24 22 - 32 mmol/L   Glucose, Bld 106 (H) 65 - 99 mg/dL   BUN 19 6 - 20 mg/dL   Creatinine, Ser 0.97 0.61 - 1.24 mg/dL   Calcium 10.0 8.9 - 10.3 mg/dL   Total Protein 8.1 6.5 - 8.1 g/dL   Albumin 4.2 3.5 - 5.0 g/dL   AST 24 15 - 41 U/L   ALT 31 17 - 63 U/L   Alkaline Phosphatase 90 38 - 126 U/L   Total Bilirubin 0.5 0.3 - 1.2 mg/dL   GFR calc non Af  Amer >60 >60 mL/min   GFR calc Af Amer >60 >60 mL/min   Anion gap 14 5 - 15  Urinalysis, Routine w reflex microscopic (not at [facility])  Result Value Ref Range   Color, Urine AMBER (A) YELLOW   APPearance CLEAR CLEAR   Specific Gravity, Urine 1.038 (H) 1.005 - 1.030   pH 6.0 5.0 - 8.0   Glucose, UA NEGATIVE NEGATIVE mg/dL   Hgb urine dipstick NEGATIVE NEGATIVE   Bilirubin Urine NEGATIVE NEGATIVE   Ketones, ur NEGATIVE NEGATIVE mg/dL   Protein, ur NEGATIVE NEGATIVE mg/dL   Nitrite NEGATIVE NEGATIVE   Leukocytes, UA NEGATIVE NEGATIVE  I-Stat CG4 Lactic Acid, ED  Result Value Ref Range   Lactic Acid, Venous 1.53 0.5 - 2.0 mmol/L  I-Stat CG4 Lactic Acid, ED  Result Value Ref Range   Lactic Acid, Venous 1.00 0.5 - 2.0 mmol/L    I have personally reviewed and evaluated these lab results as part of my medical decision-making.   MDM   Final diagnoses:  Cellulitis of left hand  Normochromic normocytic anemia  Thrombocytosis (H[facility])    Postoperative pain in the left hand after drainage of abscess. Nausea and vomiting preventing him from holding medication down. Reported fever at home. Old records reviewed confirming incision and drainage of left hand abscess on January 12 and check in the ED on January 18. Cultures grew MRSA. He is started on IV fluids and screen for sepsis is obtained. He was tachycardic but lactic acid has come back normal. Is given ondansetron for nausea and is given a dose of vancomycin. His hand surgeon is not on call today but his hand surgeon's partner is on tomorrow. Case is discussed with Dr. Tamala Julian of [facility]. Arrangements are made to admit with consultation to hand surgery.   I personally performed the services described in this documentation, which was scribed in my presence. The recorded information has been reviewed and is accurate.  Delora Fuel, MD [phone]

## 2015-12-13 NOTE — ED Notes (Signed)
Clarified diet order w/ Dr. Tamala Julian, he may eat at this time.  Discontinued NPO order per provider.

## 2015-12-13 NOTE — H&P (Addendum)
[facility] History and Physical  CECILIO Callahan [id number] DOB: May 07, 1991 DOA: 12/13/2015  Referring physician: ED PCP: No PCP Per Patient   Chief Complaint:   HPI:  [name] is a 24 year old male with a past medical history significant for IV drug abuse, hepatitis C, bowel obstruction with perforation s/p resection, s/p washout of abscess with infectious tenosynovitis to left hand  ; who presents with complaints of fever, nausea, vomiting, and abdominal pain. Patient notes that his mother took his temperature orally was 103 PTA, and then notes taking a rectal Tylenol prior to arrival he could not take anything by mouth. States this is the reason why his temperature is normal upon arrival here to the ED. Reports having several episodes of nausea vomiting unable to keep any food or liquids down at home. Associated symptoms include abdominal pain in the right upper quadrant. There are radiates up to his left shoulder. Notes that he still has his gallbladder and has previously had bowel obstructions with perforations requiring resection. He reports having several visits requiring washouts for his left hand abscess. Dr. Amedeo Plenty is his orthopedic specialist who has been caring for him. He reports being on Cipro and Flagyl for treatment. Patient was also given Howell Pringle prior to discharge on the 13th so should be covered for MRSA. States that the wound has been draining pus and is more infected. States that he change the bandage prior to arrival as to why his bandages are clean in appearance. Denies any feeling in digits 2-4 of the left hand. Review of records shows the patient's cultures from 11/28/2015 were positive for MRSA resistant to Levaquin. Patient was evaluated in the ED on 1/20  for similar complaints and a CT abdomen pelvis was performed and negative.  Upon arrival patient is evaluated  and hand surgery was consulted and will see patient in a.m.  Review of Systems   Constitutional: Positive for fever and malaise/fatigue. Negative for chills and weight loss.  HENT: Negative for ear pain and tinnitus.   Eyes: Negative for photophobia and pain.  Respiratory: Negative for hemoptysis and sputum production.   Cardiovascular: Negative for orthopnea and leg swelling.  Gastrointestinal: Positive for nausea, vomiting and abdominal pain. Negative for blood in stool.  Genitourinary: Negative for urgency and frequency.  Musculoskeletal: Positive for myalgias. Negative for back pain.  Skin: Negative for itching.       erythema around his left hand  Neurological: Positive for sensory change. Negative for seizures.  Endo/Heme/Allergies: Negative for environmental allergies and polydipsia.  Psychiatric/Behavioral: Negative for hallucinations and substance abuse.     Past Medical History  Diagnosis Date  . MRSA (methicillin resistant Staphylococcus aureus)   . Peritonitis ([place])   . Gastric ulcer   . Stomach ulcer   . Crohn's   . Bowel obstruction ([place])   . Ulcer      Past Surgical History  Procedure Laterality Date  . Gastric ulcer perforation      repair   . Incision and drainage of wound Right 10/11/2015    Procedure: IRRIGATION AND DEBRIDEMENT right upper arm;  Surgeon: Roseanne Kaufman, MD;  Location: [facility];  Service: Orthopedics;  Laterality: Right;  . Stomach surgery    . Appendectomy    . Abdominal surgery    . I&d extremity Right 11/03/2015    Procedure: IRRIGATION AND DEBRIDEMENT RIGHT ELBOW ;  Surgeon: Roseanne Kaufman, MD;  Location: [place];  Service: Orthopedics;  Laterality: Right;  . Irrigation and debridement abscess  Left 11/03/2015    Procedure: IRRIGATION AND DEBRIDEMENT LEFT HAND ABSCESS;  Surgeon: Roseanne Kaufman, MD;  Location: [place];  Service: Orthopedics;  Laterality: Left;  . I&d extremity Left 11/17/2015    Procedure: IRRIGATION AND DEBRIDEMENT LEFT HAND;  Surgeon: Roseanne Kaufman, MD;  Location: [place];  Service: Orthopedics;   Laterality: Left;  . I&d extremity Left 11/19/2015    Procedure: IRRIGATION AND DEBRIDEMENT EXTREMITY;  Surgeon: Roseanne Kaufman, MD;  Location: [place];  Service: Orthopedics;  Laterality: Left;  . I&d extremity Left 11/28/2015    Procedure: IRRIGATION AND DEBRIDEMENT HAND;  Surgeon: Roseanne Kaufman, MD;  Location: [place];  Service: Orthopedics;  Laterality: Left;  . I&d extremity Left 11/30/2015    Procedure: IRRIGATION AND DEBRIDEMENT OF LEFT HAND;  Surgeon: Roseanne Kaufman, MD;  Location: [place];  Service: Orthopedics;  Laterality: Left;  . Application of wound vac Left 11/30/2015    Procedure: APPLICATION OF WOUND VAC;  Surgeon: Roseanne Kaufman, MD;  Location: [place];  Service: Orthopedics;  Laterality: Left;  . I&d extremity Left 12/03/2015    Procedure: IRRIGATION AND DEBRIDEMENT EXTREMITY;  Surgeon: Roseanne Kaufman, MD;  Location: [place];  Service: Orthopedics;  Laterality: Left;      Social History:  reports that he has been smoking Cigarettes.  He has been smoking about 0.25 packs per day. He does not have any smokeless tobacco history on file. He reports that he uses illicit drugs (Marijuana). He reports that he does not drink alcohol. Where does patient live--home  Can patient participate in ADLs? Yes  Allergies  Allergen Reactions  . Penicillins Other (See Comments)    HAS TOLERATED CEFAZOLIN WITHOUT PROBLEM Childhood reaction Has patient had a PCN reaction causing immediate rash, facial/tongue/throat swelling, SOB or lightheadedness with hypotension: Unknown Has patient had a PCN reaction causing severe rash involving mucus membranes or skin necrosis: No Has patient had a PCN reaction that required hospitalization No Has patient had a PCN reaction occurring within the last 10 years: No If all of the above answers are "NO", then may proceed with Cephalosporin [place]  . Nsaids Other (See Comments)    Ulcers   . Ivp Dye [Iodinated Diagnostic Agents] Hives, Itching and Other (See Comments)     Needs solumedrol and benadryl prior  . Ketorolac Tromethamine Hives, Itching and Other (See Comments)    STOMACH ULCER  . Morphine And Related Hives and Itching  . Tramadol Nausea And Vomiting    Ulcer     Family History  Problem Relation Age of Onset  . Heart failure Mother   . Congestive Heart Failure Mother        Prior to Admission medications   Medication Sig Start Date End Date Taking? Authorizing Provider  acetaminophen (TYLENOL) 500 MG tablet Take 1,000 mg by mouth 2 (two) times daily as needed for moderate pain.    Historical Provider, MD  ALPRAZolam Duanne Moron) 0.5 MG tablet Take 1 tablet (0.5 mg total) by mouth 2 (two) times daily. 12/05/15   Roseanne Kaufman, MD  dicyclomine (BENTYL) 10 MG capsule Take 2 capsules (20 mg total) by mouth 4 (four) times daily as needed for spasms. 12/11/15   Nicole Pisciotta, PA-C  ondansetron (ZOFRAN) 4 MG tablet Take 1 tablet (4 mg total) by mouth every 8 (eight) hours as needed for nausea or vomiting. 12/11/15   Elmyra Ricks Pisciotta, PA-C  oxyCODONE (OXY IR/ROXICODONE) 5 MG immediate release tablet Take 1-2 tablets (5-10 mg total) by mouth every 6 (six)  hours as needed for moderate pain. 12/05/15   Roseanne Kaufman, MD     Physical Exam: Filed Vitals:   12/12/15 2341 12/13/15 0158 12/13/15 0301  BP: 146/92  125/61  Pulse: 122 110 111  Temp: 99.3 F (37.4 C) 98.6 F (37 C)   TempSrc: Oral Oral   Resp: 20    Height: 5\' 9"  (1.753 m)    Weight: 72.576 kg (160 lb)    SpO2: 100%  99%     Constitutional: Vital signs reviewed. Patient is a well-developed and well-nourished in no acute distress and cooperative with exam. Alert and oriented x3.  Head: Normocephalic and atraumatic  Ear: TM normal bilaterally  Mouth: no erythema or exudates Eyes: PERRL, EOMI, conjunctivae normal, No scleral icterus.  Neck: Supple, Trachea midline normal ROM, No JVD, mass, thyromegaly, or carotid bruit present.  Cardiovascular: Tachycardic,  Pulmonary/Chest: CTAB, no  wheezes, rales, or rhonchi  Abdominal: Significant tenderness to palpation of the right upper quadrant, normal bowel sounds, midline healed scar  GU: no CVA tenderness Musculoskeletal: No joint deformities, erythema, or stiffness, inability to flex second through third digits of left hand  Ext: volar splint to left arm, was removed can see surgical incision on dorsum of left hand shows intact suture line with mild erythema border of erythema no significant signs of purulent drainage  Hematology: no cervical, inginal, or axillary adenopathy.  Neurological: A&O x3, Strenght is normal and symmetric bilaterally, cranial nerve II-XII are grossly intact, no focal motor deficit, sensory intact to light touch bilaterally.  Skin: Warm, dry and intact. No rash, cyanosis, or clubbing.  Psychiatric: Normal mood and affect. speech and behavior is normal. Judgment and thought content normal. Cognition and memory are normal.      Data Review   Micro Results Recent Results (from the past 240 hour(s))  Blood Culture (routine x 2)     Status: None (Preliminary result)   Collection Time: 12/11/15 10:45 AM  Result Value Ref Range Status   Specimen Description BLOOD BLOOD RIGHT FOREARM  Final   Special Requests BOTTLES DRAWN AEROBIC AND ANAEROBIC 5CC  Final   Culture NO GROWTH 1 DAY  Final   Report Status PENDING  Incomplete  Blood Culture (routine x 2)     Status: None (Preliminary result)   Collection Time: 12/11/15 10:53 AM  Result Value Ref Range Status   Specimen Description BLOOD RIGHT HAND  Final   Special Requests BOTTLES DRAWN AEROBIC AND ANAEROBIC 5CC  Final   Culture NO GROWTH 1 DAY  Final   Report Status PENDING  Incomplete  Urine culture     Status: None   Collection Time: 12/11/15  2:11 PM  Result Value Ref Range Status   Specimen Description URINE, CLEAN CATCH  Final   Special Requests NONE  Final   Culture NO GROWTH 1 DAY  Final   Report Status 12/12/2015 FINAL  Final    Radiology  Reports Ct Abdomen Pelvis W Contrast  12/11/2015  CLINICAL DATA:  Abdominal pain and fever. Pain is right-sided and began this morning. EXAM: CT ABDOMEN AND PELVIS WITH CONTRAST TECHNIQUE: Multidetector CT imaging of the abdomen and pelvis was performed using the standard protocol following bolus administration of intravenous contrast. CONTRAST:  13mL OMNIPAQUE IOHEXOL 300 MG/ML  SOLN COMPARISON:  05/26/2015 FINDINGS: Lower chest and abdominal wall: Laparotomy scar. No hernia. Lower chest is negative. Hepatobiliary: No focal liver abnormality.No evidence of biliary obstruction or stone. Pancreas: Unremarkable. Spleen: Unremarkable. Adrenals/Urinary Tract: Negative adrenals.  There are few punctate densities at the papillary tips of the right kidney on coronal reformats which may be early excretion of contrast given uniform appearance. No hydronephrosis or ureteral calculus. Unremarkable bladder. Reproductive:No pathologic findings. Stomach/Bowel:  No obstruction. Appendectomy. Vascular/Lymphatic: No acute vascular abnormality. No mass or adenopathy. Peritoneal: Nonspecific trace pelvic fluid. No primary inflammatory source identified. Musculoskeletal: No acute abnormalities. IMPRESSION: No explanation for acute pain. Electronically Signed   By: Monte Fantasia M.D.   On: 12/11/2015 14:08   Dg Hand 2 View Left  11/28/2015  CLINICAL DATA:  Dorsal left hand abscess. EXAM: LEFT HAND - 2 VIEW COMPARISON:  11/27/2015 left hand radiographs. FINDINGS: Severe soft tissue swelling in the dorsal left hand with associated soft tissue defect likely representing postsurgical change. No appreciable cortical erosions or periosteal reaction. No appreciable fracture, dislocation or suspicious focal osseous lesion. IMPRESSION: Severe dorsal left hand soft tissue swelling, with no radiographic evidence of osteomyelitis. Electronically Signed   By: Ilona Sorrel M.D.   On: 11/28/2015 08:54   Dg Chest Port 1 View  12/11/2015   CLINICAL DATA:  Sepsis EXAM: PORTABLE CHEST 1 VIEW COMPARISON:  May 06, 2012 FINDINGS: No edema or consolidation. The heart size and pulmonary vascularity are normal. No adenopathy. No bone lesions. IMPRESSION: No edema or consolidation. Electronically Signed   By: Lowella Grip III M.D.   On: 12/11/2015 10:38   Dg Hand Complete Left  12/11/2015  CLINICAL DATA:  Left hand infection. EXAM: LEFT HAND - COMPLETE 3+ VIEW COMPARISON:  November 28, 2015. FINDINGS: There is no evidence of fracture or dislocation. There is no evidence of arthropathy or other focal bone abnormality. Dressing is seen overlying dorsal soft tissues. No radiopaque foreign body is noted. IMPRESSION: No fracture or dislocation is noted. No radiopaque foreign body is seen. Electronically Signed   By: Marijo Conception, M.D.   On: 12/11/2015 11:26   Dg Hand Complete Left  11/27/2015  CLINICAL DATA:  LEFT hand surgery 5 days prior. Open wound on the dorsal aspect of the hand. EXAM: LEFT HAND - COMPLETE 3+ VIEW COMPARISON:  None. FINDINGS: Soft tissue swelling of the dorsum of the hand at the level of the distal metacarpals. No foreign body. No osseous abnormality. No subcutaneous gas. IMPRESSION: Soft tissue swelling of the dorsum of the hand. No osseous abnormality. Electronically Signed   By: Suzy Bouchard M.D.   On: 11/27/2015 18:10     CBC  Recent Labs Lab 12/11/15 1045 12/13/15 0026  WBC 6.2 7.0  HGB 11.9* 11.5*  HCT 35.9* 34.9*  PLT 381 464*  MCV 86.1 86.6  MCH 28.5 28.5  MCHC 33.1 33.0  RDW 14.6 15.2  LYMPHSABS 2.5 1.9  MONOABS 0.9 0.8  EOSABS 0.2 0.1  BASOSABS 0.1 0.0    Chemistries   Recent Labs Lab 12/11/15 1045 12/13/15 0026  NA 144 144  K 4.5 3.5  CL 105 106  CO2 25 24  GLUCOSE 102* 106*  BUN 12 19  CREATININE 0.94 0.97  CALCIUM 10.0 10.0  AST 27 24  ALT 32 31  ALKPHOS 90 90  BILITOT 0.4 0.5    ------------------------------------------------------------------------------------------------------------------ estimated creatinine clearance is 116.4 mL/min (by C-G formula based on Cr of 0.97). ------------------------------------------------------------------------------------------------------------------ No results for input(s): HGBA1C in the last 72 hours. ------------------------------------------------------------------------------------------------------------------ No results for input(s): CHOL, HDL, LDLCALC, TRIG, CHOLHDL, LDLDIRECT in the last 72 hours. ------------------------------------------------------------------------------------------------------------------ No results for input(s): TSH, T4TOTAL, T3FREE, THYROIDAB in the last 72 hours.  Invalid  input(s): FREET3 ------------------------------------------------------------------------------------------------------------------ No results for input(s): VITAMINB12, FOLATE, FERRITIN, TIBC, IRON, RETICCTPCT in the last 72 hours.  Coagulation profile No results for input(s): INR, PROTIME in the last 168 hours.  No results for input(s): DDIMER in the last 72 hours.  Cardiac Enzymes No results for input(s): CKMB, TROPONINI, MYOGLOBIN in the last 168 hours.  Invalid input(s): CK ------------------------------------------------------------------------------------------------------------------ Invalid input(s): POCBNP   CBG: No results for input(s): GLUCAP in the last 168 hours.       Assessment/Plan Active Problems:  Cellulitis of left hand: On physical exam minimum purulent drainage seen as patient had described small border of erythema surrounding the wound. Patient reports being on ciprofloxacin and metronidazole for treatment of his infected left hand. Question if this is adequate coverage given MRSA sensitivity results from 1/13 but it appears patient given Howell Pringle prior to discharge on the 13th so should be  covered for the MRSA for at least 2 weeks.  . Hand surgery already consulted in the ED. - Admit to MedSurg bed - Vancomycin IV recommend consult ID to continue - Follow-up orthopedic recommendations - Pain control with home regimen  Nausea with vomiting - Zofran IV prn nausea/vomiting - IV fluids of normal saline  Right upper quadrant abdominal pain:  Previous CT of the abdomen show no acute abnormalities on 12/11/2015 - Check abdominal ultrasound  History of IV drug abuse -Check UDS  Tachycardia: Initially HR in the 120s, but improved since IV fluids. Could be opioid withdrawals as to the reason for his tachycardia and nausea vomiting symptoms - Check EKG  Anemia: stable -Continue to monitor  Thrombocytosis: Blood count elevated at  suspected to be reactive  Code Status:   full Family Communication: bedside Disposition Plan: admit   Total time spent 55 minutes.Greater than 50% of this time was spent in counseling, explanation of diagnosis, planning of further management, and coordination of care  [facility] Pager [phone]  If 7PM-7AM, please contact night-coverage [email or web address] Password [facility] 12/13/2015, 4:57 AM

## 2015-12-13 NOTE — Discharge Summary (Signed)
[facility] Discharge Summary   Patient: Travis Callahan [id number]   PCP: No PCP Per Patient DOB: September 08, 1991   Date of admission: 12/13/2015   Date of discharge: 12/13/2015    Discharge Diagnoses:patient left AMA  Principal Problem:   Cellulitis of left hand Active Problems:   Normocytic anemia   Polysubstance abuse   Nausea and vomiting   Right upper quadrant abdominal pain   Thrombocytosis ([place])  History of present illness: As per the H and P dictated on admission, "Mr. Criscuolo is a 25 year old male with a past medical history significant for IV drug abuse, hepatitis C, bowel obstruction with perforation s/p resection, s/p washout of abscess with infectious tenosynovitis to left hand ; who presents with complaints of fever, nausea, vomiting, and abdominal pain. Patient notes that his mother took his temperature orally was 103 PTA, and then notes taking a rectal Tylenol prior to arrival he could not take anything by mouth. States this is the reason why his temperature is normal upon arrival here to the ED. Reports having several episodes of nausea vomiting unable to keep any food or liquids down at home. Associated symptoms include abdominal pain in the right upper quadrant. There are radiates up to his left shoulder. Notes that he still has his gallbladder and has previously had bowel obstructions with perforations requiring resection. He reports having several visits requiring washouts for his left hand abscess. Dr. Amedeo Plenty is his orthopedic specialist who has been caring for him. He reports being on Cipro and Flagyl for treatment. Patient was also given Howell Pringle prior to discharge on the 13th so should be covered for MRSA. States that the wound has been draining pus and is more infected. States that he change the bandage prior to arrival as to why his bandages are clean in appearance. Denies any feeling in digits 2-4 of the left hand. Review of records shows the patient's  cultures from 11/28/2015 were positive for MRSA resistant to Levaquin. Patient was evaluated in the ED on 1/20 for similar complaints and a CT abdomen pelvis was performed and negative.  Upon arrival patient is evaluated and hand surgery was consulted and will see patient in a.m."  Hospital Course:  Summary of his active problems in the hospital is as following. Principal Problem:   Cellulitis of left hand Patient presented with worsening pain in the left upper extremity. Was evaluated in the ER and discussed with orthopedic on the phone. Patient was recommended to be admitted in the hospital. Patient was given IV fluids, pain medications, IV vancomycin. Patient did receive Oritavancin 1,200mg  x1 dose, on 12/04/2015, and does have a follow-up with Dr. Megan Salon as well as Dr. Amedeo Plenty. Patient was initially seen in the ER and was reassured that his blood work is appearing stable and will await for recommendations from orthopedics but Patient left the facility [facility] before he was seen by orthopedic attending for further recommendation regarding his wound. I was not notified about patient's decision to leave the facility prior to him leaving.  Procedures and Results:  none   Consultations:  orthopedics  Allergies  Allergen Reactions  . Penicillins Other (See Comments)    HAS TOLERATED CEFAZOLIN WITHOUT PROBLEM Childhood reaction Has patient had a PCN reaction causing immediate rash, facial/tongue/throat swelling, SOB or lightheadedness with hypotension: Unknown Has patient had a PCN reaction causing severe rash involving mucus membranes or skin necrosis: No Has patient had a PCN reaction that required hospitalization No Has patient had a PCN  reaction occurring within the last 10 years: No If all of the above answers are "NO", then may proceed with Cephalosporin [place]  . Nsaids Other (See Comments)    Ulcers   . Ivp Dye [Iodinated Diagnostic Agents] Hives, Itching and  Other (See Comments)    Needs solumedrol and benadryl prior  . Ketorolac Tromethamine Hives, Itching and Other (See Comments)    STOMACH ULCER  . Morphine And Related Hives and Itching  . Tramadol Nausea And Vomiting    Ulcer    Exam: Filed Weights   12/12/15 2341  Weight: 72.576 kg (160 lb)   Filed Vitals:   12/13/15 0730 12/13/15 1215  BP: 133/78 134/87  Pulse: 95 121  Temp:  99.9 F (37.7 C)  Resp: 17 17   General: Appear in moderate distress, no Rash; Oral Mucosa moist. Cardiovascular: S1 and S2 Present, no Murmur, no JVD Respiratory: Bilateral Air entry present and Clear to Auscultation, no Crackles, no wheezes Abdomen: Bowel Sound present, Soft and RUQ tenderness Extremities: no Pedal edema, no calf tenderness Right upper extremity swollen and tender Neurology: Grossly no focal neuro deficit.  The results of significant diagnostics from this hospitalization (including imaging, microbiology, ancillary and laboratory) are listed below for reference.    Significant Diagnostic Studies: Ct Abdomen Pelvis W Contrast  12/11/2015  CLINICAL DATA:  Abdominal pain and fever. Pain is right-sided and began this morning. EXAM: CT ABDOMEN AND PELVIS WITH CONTRAST TECHNIQUE: Multidetector CT imaging of the abdomen and pelvis was performed using the standard protocol following bolus administration of intravenous contrast. CONTRAST:  123mL OMNIPAQUE IOHEXOL 300 MG/ML  SOLN COMPARISON:  05/26/2015 FINDINGS: Lower chest and abdominal wall: Laparotomy scar. No hernia. Lower chest is negative. Hepatobiliary: No focal liver abnormality.No evidence of biliary obstruction or stone. Pancreas: Unremarkable. Spleen: Unremarkable. Adrenals/Urinary Tract: Negative adrenals. There are few punctate densities at the papillary tips of the right kidney on coronal reformats which may be early excretion of contrast given uniform appearance. No hydronephrosis or ureteral calculus. Unremarkable bladder.  Reproductive:No pathologic findings. Stomach/Bowel:  No obstruction. Appendectomy. Vascular/Lymphatic: No acute vascular abnormality. No mass or adenopathy. Peritoneal: Nonspecific trace pelvic fluid. No primary inflammatory source identified. Musculoskeletal: No acute abnormalities. IMPRESSION: No explanation for acute pain. Electronically Signed   By: Monte Fantasia M.D.   On: 12/11/2015 14:08   Dg Hand 2 View Left  11/28/2015  CLINICAL DATA:  Dorsal left hand abscess. EXAM: LEFT HAND - 2 VIEW COMPARISON:  11/27/2015 left hand radiographs. FINDINGS: Severe soft tissue swelling in the dorsal left hand with associated soft tissue defect likely representing postsurgical change. No appreciable cortical erosions or periosteal reaction. No appreciable fracture, dislocation or suspicious focal osseous lesion. IMPRESSION: Severe dorsal left hand soft tissue swelling, with no radiographic evidence of osteomyelitis. Electronically Signed   By: Ilona Sorrel M.D.   On: 11/28/2015 08:54   Dg Chest Port 1 View  12/11/2015  CLINICAL DATA:  Sepsis EXAM: PORTABLE CHEST 1 VIEW COMPARISON:  May 06, 2012 FINDINGS: No edema or consolidation. The heart size and pulmonary vascularity are normal. No adenopathy. No bone lesions. IMPRESSION: No edema or consolidation. Electronically Signed   By: Lowella Grip III M.D.   On: 12/11/2015 10:38   Dg Hand Complete Left  12/11/2015  CLINICAL DATA:  Left hand infection. EXAM: LEFT HAND - COMPLETE 3+ VIEW COMPARISON:  November 28, 2015. FINDINGS: There is no evidence of fracture or dislocation. There is no evidence of arthropathy or other focal bone  abnormality. Dressing is seen overlying dorsal soft tissues. No radiopaque foreign body is noted. IMPRESSION: No fracture or dislocation is noted. No radiopaque foreign body is seen. Electronically Signed   By: Marijo Conception, M.D.   On: 12/11/2015 11:26   Dg Hand Complete Left  11/27/2015  CLINICAL DATA:  LEFT hand surgery 5 days prior.  Open wound on the dorsal aspect of the hand. EXAM: LEFT HAND - COMPLETE 3+ VIEW COMPARISON:  None. FINDINGS: Soft tissue swelling of the dorsum of the hand at the level of the distal metacarpals. No foreign body. No osseous abnormality. No subcutaneous gas. IMPRESSION: Soft tissue swelling of the dorsum of the hand. No osseous abnormality. Electronically Signed   By: Suzy Bouchard M.D.   On: 11/27/2015 18:10    Microbiology: Recent Results (from the past 240 hour(s))  Blood Culture (routine x 2)     Status: None (Preliminary result)   Collection Time: 12/11/15 10:45 AM  Result Value Ref Range Status   Specimen Description BLOOD BLOOD RIGHT FOREARM  Final   Special Requests BOTTLES DRAWN AEROBIC AND ANAEROBIC 5CC  Final   Culture NO GROWTH 1 DAY  Final   Report Status PENDING  Incomplete  Blood Culture (routine x 2)     Status: None (Preliminary result)   Collection Time: 12/11/15 10:53 AM  Result Value Ref Range Status   Specimen Description BLOOD RIGHT HAND  Final   Special Requests BOTTLES DRAWN AEROBIC AND ANAEROBIC 5CC  Final   Culture NO GROWTH 1 DAY  Final   Report Status PENDING  Incomplete  Urine culture     Status: None   Collection Time: 12/11/15  2:11 PM  Result Value Ref Range Status   Specimen Description URINE, CLEAN CATCH  Final   Special Requests NONE  Final   Culture NO GROWTH 1 DAY  Final   Report Status 12/12/2015 FINAL  Final   Labs: CBC:  Recent Labs Lab 12/11/15 1045 12/13/15 0026  WBC 6.2 7.0  NEUTROABS 2.7 4.1  HGB 11.9* 11.5*  HCT 35.9* 34.9*  MCV 86.1 86.6  PLT 381 [id number]*   Basic Metabolic Panel:  Recent Labs Lab 12/11/15 1045 12/13/15 0026  NA 144 144  K 4.5 3.5  CL 105 106  CO2 25 24  GLUCOSE 102* 106*  BUN 12 19  CREATININE 0.94 0.97  CALCIUM 10.0 10.0   Liver Function Tests:  Recent Labs Lab 12/11/15 1045 12/13/15 0026  AST 27 24  ALT 32 31  ALKPHOS 90 90  BILITOT 0.4 0.5  PROT 7.3 8.1  ALBUMIN 3.7 4.2   Time spent:  30 minutes  Signed:  Janella Rogala  [facility] 12/13/2015, 2:10 PM

## 2015-12-13 NOTE — ED Notes (Addendum)
Pt updated on delay. Continuing to go in&out of ER to smoke. Pt in NAD.

## 2015-12-16 LAB — CULTURE, BLOOD (ROUTINE X 2)
CULTURE: NO GROWTH
Culture: NO GROWTH

## 2015-12-18 LAB — CULTURE, BLOOD (ROUTINE X 2)
CULTURE: NO GROWTH
CULTURE: NO GROWTH

## 2015-12-22 ENCOUNTER — Telehealth: Payer: Self-pay | Admitting: *Deleted

## 2015-12-22 ENCOUNTER — Ambulatory Visit: Payer: Self-pay | Admitting: Internal Medicine

## 2015-12-22 NOTE — Telephone Encounter (Signed)
No Show for [facility] appt.  All listed phone number are non-working.

## 2015-12-25 LAB — FUNGUS CULTURE W SMEAR
Fungal Smear: NONE SEEN
Fungal Smear: NONE SEEN
Fungal Smear: NONE SEEN

## 2016-04-01 ENCOUNTER — Emergency Department (HOSPITAL_COMMUNITY): Payer: Self-pay

## 2016-04-01 ENCOUNTER — Encounter (HOSPITAL_COMMUNITY): Payer: Self-pay | Admitting: *Deleted

## 2016-04-01 ENCOUNTER — Emergency Department (HOSPITAL_COMMUNITY)
Admission: EM | Admit: 2016-04-01 | Discharge: 2016-04-01 | Disposition: A | Discharge status: Home / Self Care | Payer: Self-pay | Attending: Emergency Medicine | Admitting: Emergency Medicine

## 2016-04-01 DIAGNOSIS — T1490XA Injury, unspecified, initial encounter: Secondary | ICD-10-CM

## 2016-04-01 DIAGNOSIS — Z8614 Personal history of Methicillin resistant Staphylococcus aureus infection: Secondary | ICD-10-CM | POA: Insufficient documentation

## 2016-04-01 DIAGNOSIS — Z8719 Personal history of other diseases of the digestive system: Secondary | ICD-10-CM | POA: Insufficient documentation

## 2016-04-01 DIAGNOSIS — S3991XA Unspecified injury of abdomen, initial encounter: Secondary | ICD-10-CM | POA: Insufficient documentation

## 2016-04-01 DIAGNOSIS — R197 Diarrhea, unspecified: Secondary | ICD-10-CM | POA: Insufficient documentation

## 2016-04-01 DIAGNOSIS — Z87891 Personal history of nicotine dependence: Secondary | ICD-10-CM | POA: Insufficient documentation

## 2016-04-01 DIAGNOSIS — Y998 Other external cause status: Secondary | ICD-10-CM | POA: Insufficient documentation

## 2016-04-01 DIAGNOSIS — M545 Low back pain: Secondary | ICD-10-CM

## 2016-04-01 DIAGNOSIS — Y92410 Unspecified street and highway as the place of occurrence of the external cause: Secondary | ICD-10-CM | POA: Insufficient documentation

## 2016-04-01 DIAGNOSIS — Z79899 Other long term (current) drug therapy: Secondary | ICD-10-CM | POA: Insufficient documentation

## 2016-04-01 DIAGNOSIS — S3992XA Unspecified injury of lower back, initial encounter: Secondary | ICD-10-CM | POA: Insufficient documentation

## 2016-04-01 DIAGNOSIS — S299XXA Unspecified injury of thorax, initial encounter: Secondary | ICD-10-CM | POA: Insufficient documentation

## 2016-04-01 DIAGNOSIS — Y9355 Activity, bike riding: Secondary | ICD-10-CM | POA: Insufficient documentation

## 2016-04-01 DIAGNOSIS — R109 Unspecified abdominal pain: Secondary | ICD-10-CM

## 2016-04-01 DIAGNOSIS — Z880 Allergy status to penicillin: Secondary | ICD-10-CM | POA: Insufficient documentation

## 2016-04-01 DIAGNOSIS — R079 Chest pain, unspecified: Secondary | ICD-10-CM

## 2016-04-01 LAB — URINALYSIS, ROUTINE W REFLEX MICROSCOPIC
BILIRUBIN URINE: NEGATIVE
GLUCOSE, UA: NEGATIVE mg/dL
Hgb urine dipstick: NEGATIVE
KETONES UR: NEGATIVE mg/dL
LEUKOCYTES UA: NEGATIVE
Nitrite: NEGATIVE
PH: 6 (ref 5.0–8.0)
PROTEIN: NEGATIVE mg/dL
Specific Gravity, Urine: 1.012 (ref 1.005–1.030)

## 2016-04-01 LAB — CBC WITH DIFFERENTIAL/PLATELET
Basophils Absolute: 0 10*3/uL (ref 0.0–0.1)
Basophils Relative: 0 %
Eosinophils Absolute: 0.1 10*3/uL (ref 0.0–0.7)
Eosinophils Relative: 2 %
HEMATOCRIT: 37.9 % — AB (ref 39.0–52.0)
HEMOGLOBIN: 12.1 g/dL — AB (ref 13.0–17.0)
LYMPHS ABS: 2.3 10*3/uL (ref 0.7–4.0)
LYMPHS PCT: 33 %
MCH: 26.4 pg (ref 26.0–34.0)
MCHC: 31.9 g/dL (ref 30.0–36.0)
MCV: 82.8 fL (ref 78.0–100.0)
MONO ABS: 0.7 10*3/uL (ref 0.1–1.0)
MONOS PCT: 10 %
NEUTROS ABS: 3.7 10*3/uL (ref 1.7–7.7)
NEUTROS PCT: 55 %
Platelets: 421 10*3/uL — ABNORMAL HIGH (ref 150–400)
RBC: 4.58 MIL/uL (ref 4.22–5.81)
RDW: 17.9 % — AB (ref 11.5–15.5)
WBC: 6.8 10*3/uL (ref 4.0–10.5)

## 2016-04-01 LAB — COMPREHENSIVE METABOLIC PANEL
ALK PHOS: 152 U/L — AB (ref 38–126)
ALT: 36 U/L (ref 17–63)
ANION GAP: 11 (ref 5–15)
AST: 25 U/L (ref 15–41)
Albumin: 3.6 g/dL (ref 3.5–5.0)
BILIRUBIN TOTAL: 0.4 mg/dL (ref 0.3–1.2)
BUN: 9 mg/dL (ref 6–20)
CALCIUM: 9.3 mg/dL (ref 8.9–10.3)
CO2: 22 mmol/L (ref 22–32)
Chloride: 104 mmol/L (ref 101–111)
Creatinine, Ser: 0.84 mg/dL (ref 0.61–1.24)
GFR calc Af Amer: >60 mL/min (ref >60)
Glucose, Bld: 112 mg/dL — ABNORMAL HIGH (ref 65–99)
POTASSIUM: 4.7 mmol/L (ref 3.5–5.1)
Sodium: 137 mmol/L (ref 135–145)
TOTAL PROTEIN: 7.1 g/dL (ref 6.5–8.1)

## 2016-04-01 LAB — LIPASE, BLOOD: LIPASE: 31 U/L (ref 11–51)

## 2016-04-01 MED ORDER — SODIUM CHLORIDE 0.9 % IV BOLUS (SEPSIS)
1000.0000 mL | Freq: Once | INTRAVENOUS | Status: AC
  Administered 2016-04-01: 1000 mL via INTRAVENOUS

## 2016-04-01 MED ORDER — ONDANSETRON HCL 4 MG/2ML IJ SOLN
4.0000 mg | Freq: Once | INTRAMUSCULAR | Status: AC
  Administered 2016-04-01: 4 mg via INTRAVENOUS
  Filled 2016-04-01: qty 2

## 2016-04-01 MED ORDER — HYDROMORPHONE HCL 1 MG/ML IJ SOLN
1.0000 mg | Freq: Once | INTRAMUSCULAR | Status: AC
  Administered 2016-04-01: 1 mg via INTRAVENOUS
  Filled 2016-04-01: qty 1

## 2016-04-01 MED ORDER — MORPHINE SULFATE (PF) 4 MG/ML IV SOLN
4.0000 mg | Freq: Once | INTRAVENOUS | Status: DC
  Filled 2016-04-01: qty 1

## 2016-04-01 NOTE — ED Notes (Signed)
Provider at bedside
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
## 2016-04-01 NOTE — Discharge Instructions (Signed)
I recommend taking Tylenol as prescribed over-the-counter as needed for pain relief. He may also apply ice to affected areas for 15-20 minutes 3-4 times daily. Please follow up with a primary care provider from the Resource Guide provided below in one week if her symptoms have not improved. Please return to the Emergency Department if symptoms worsen or new onset of fever, headache, lightheadedness, dizziness, visual changes, confusion, chest pain, difficulty breathing, abdominal pain, vomiting, unable to keep fluids down, numbness, tingling, groin numbness, loss of bowel or bladder, weakness.
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
## 2016-05-09 ENCOUNTER — Encounter (HOSPITAL_COMMUNITY): Payer: Self-pay | Admitting: *Deleted

## 2016-08-17 ENCOUNTER — Encounter (HOSPITAL_COMMUNITY): Payer: Self-pay | Admitting: *Deleted

## 2016-11-28 ENCOUNTER — Emergency Department (HOSPITAL_BASED_OUTPATIENT_CLINIC_OR_DEPARTMENT_OTHER): Payer: Self-pay

## 2016-11-28 ENCOUNTER — Emergency Department (HOSPITAL_BASED_OUTPATIENT_CLINIC_OR_DEPARTMENT_OTHER)
Admission: EM | Admit: 2016-11-28 | Discharge: 2016-11-28 | Disposition: A | Discharge status: Home / Self Care | Payer: Self-pay | Attending: Emergency Medicine | Admitting: Emergency Medicine

## 2016-11-28 ENCOUNTER — Encounter (HOSPITAL_BASED_OUTPATIENT_CLINIC_OR_DEPARTMENT_OTHER): Payer: Self-pay

## 2016-11-28 DIAGNOSIS — R112 Nausea with vomiting, unspecified: Secondary | ICD-10-CM | POA: Insufficient documentation

## 2016-11-28 DIAGNOSIS — Z87891 Personal history of nicotine dependence: Secondary | ICD-10-CM | POA: Insufficient documentation

## 2016-11-28 DIAGNOSIS — N433 Hydrocele, unspecified: Secondary | ICD-10-CM | POA: Insufficient documentation

## 2016-11-28 DIAGNOSIS — R109 Unspecified abdominal pain: Secondary | ICD-10-CM

## 2016-11-28 DIAGNOSIS — N50812 Left testicular pain: Secondary | ICD-10-CM

## 2016-11-28 DIAGNOSIS — Z79899 Other long term (current) drug therapy: Secondary | ICD-10-CM | POA: Insufficient documentation

## 2016-11-28 LAB — COMPREHENSIVE METABOLIC PANEL
ALBUMIN: 4.8 g/dL (ref 3.5–5.0)
ALT: 27 U/L (ref 17–63)
ANION GAP: 11 (ref 5–15)
AST: 29 U/L (ref 15–41)
Alkaline Phosphatase: 76 U/L (ref 38–126)
BUN: 16 mg/dL (ref 6–20)
CHLORIDE: 106 mmol/L (ref 101–111)
CO2: 21 mmol/L — ABNORMAL LOW (ref 22–32)
CREATININE: 0.97 mg/dL (ref 0.61–1.24)
Calcium: 10.1 mg/dL (ref 8.9–10.3)
GFR calc non Af Amer: >60 mL/min (ref >60)
Glucose, Bld: 101 mg/dL — ABNORMAL HIGH (ref 65–99)
Potassium: 4.1 mmol/L (ref 3.5–5.1)
SODIUM: 138 mmol/L (ref 135–145)
Total Bilirubin: 0.5 mg/dL (ref 0.3–1.2)
Total Protein: 8.1 g/dL (ref 6.5–8.1)

## 2016-11-28 LAB — CBC WITH DIFFERENTIAL/PLATELET
BASOS PCT: 0 %
Basophils Absolute: 0 10*3/uL (ref 0.0–0.1)
EOS ABS: 0.1 10*3/uL (ref 0.0–0.7)
EOS PCT: 1 %
HCT: 39.4 % (ref 39.0–52.0)
Hemoglobin: 13.1 g/dL (ref 13.0–17.0)
Lymphocytes Relative: 23 %
Lymphs Abs: 1.8 10*3/uL (ref 0.7–4.0)
MCH: 28.7 pg (ref 26.0–34.0)
MCHC: 33.2 g/dL (ref 30.0–36.0)
MCV: 86.4 fL (ref 78.0–100.0)
Monocytes Absolute: 0.7 10*3/uL (ref 0.1–1.0)
Monocytes Relative: 9 %
Neutro Abs: 5 10*3/uL (ref 1.7–7.7)
Neutrophils Relative %: 67 %
PLATELETS: 339 10*3/uL (ref 150–400)
RBC: 4.56 MIL/uL (ref 4.22–5.81)
RDW: 15.6 % — ABNORMAL HIGH (ref 11.5–15.5)
WBC: 7.6 10*3/uL (ref 4.0–10.5)

## 2016-11-28 LAB — LIPASE, BLOOD: Lipase: 26 U/L (ref 11–51)

## 2016-11-28 MED ORDER — SODIUM CHLORIDE 0.9 % IV BOLUS (SEPSIS)
1000.0000 mL | Freq: Once | INTRAVENOUS | Status: AC
  Administered 2016-11-28: 1000 mL via INTRAVENOUS

## 2016-11-28 MED ORDER — HYDROMORPHONE HCL 1 MG/ML IJ SOLN
1.0000 mg | Freq: Once | INTRAMUSCULAR | Status: AC
  Administered 2016-11-28: 1 mg via INTRAVENOUS
  Filled 2016-11-28: qty 1

## 2016-11-28 MED ORDER — LORAZEPAM 2 MG/ML IJ SOLN
1.0000 mg | Freq: Once | INTRAMUSCULAR | Status: AC
  Administered 2016-11-28: 1 mg via INTRAVENOUS
  Filled 2016-11-28: qty 1

## 2016-11-28 MED ORDER — METOCLOPRAMIDE HCL 5 MG/ML IJ SOLN
10.0000 mg | Freq: Once | INTRAMUSCULAR | Status: AC
  Administered 2016-11-28: 10 mg via INTRAVENOUS
  Filled 2016-11-28: qty 2

## 2016-11-28 MED ORDER — IOPAMIDOL (ISOVUE-300) INJECTION 61%
100.0000 mL | Freq: Once | INTRAVENOUS | Status: AC | PRN
  Administered 2016-11-28: 100 mL via INTRAVENOUS

## 2016-11-28 MED ORDER — ONDANSETRON 4 MG PO TBDP: 4.0000 mg | ORAL_TABLET | Freq: Three times a day (TID) | ORAL | 0 refills | Status: DC | PRN

## 2016-11-28 MED ORDER — FENTANYL CITRATE (PF) 100 MCG/2ML IJ SOLN
50.0000 ug | Freq: Once | INTRAMUSCULAR | Status: AC
  Administered 2016-11-28: 50 ug via INTRAVENOUS
  Filled 2016-11-28: qty 2

## 2016-11-28 MED ORDER — DIPHENHYDRAMINE HCL 50 MG/ML IJ SOLN
25.0000 mg | Freq: Once | INTRAMUSCULAR | Status: AC
  Administered 2016-11-28: 25 mg via INTRAVENOUS
  Filled 2016-11-28: qty 1

## 2016-11-28 MED ORDER — METHYLPREDNISOLONE SODIUM SUCC 125 MG IJ SOLR
125.0000 mg | Freq: Once | INTRAMUSCULAR | Status: AC
  Administered 2016-11-28: 125 mg via INTRAVENOUS
  Filled 2016-11-28: qty 2

## 2016-11-28 MED ORDER — ONDANSETRON HCL 4 MG/2ML IJ SOLN
4.0000 mg | Freq: Once | INTRAMUSCULAR | Status: AC
  Administered 2016-11-28: 4 mg via INTRAVENOUS
  Filled 2016-11-28: qty 2

## 2016-11-28 MED ORDER — PROMETHAZINE HCL 25 MG/ML IJ SOLN
25.0000 mg | Freq: Once | INTRAMUSCULAR | Status: AC
  Administered 2016-11-28: 25 mg via INTRAVENOUS
  Filled 2016-11-28: qty 1

## 2016-11-28 NOTE — Discharge Instructions (Signed)
As we discussed, your CT scan did not show any acute findings today. Your ultrasound did show a left-sided hydrocele which is benign. Take the prescribed medication as directed for nausea.   Return to the ED for new or worsening symptoms.

## 2016-11-28 NOTE — ED Notes (Signed)
Patient transported to CT 
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
## 2016-11-28 NOTE — ED Notes (Signed)
Upon entering the patient's room, the patient states that after coughing his left testicle has increased in size. PA made aware and evaluated patient reporting left testicle is still soft but is double in size.
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
## 2016-11-28 NOTE — ED Notes (Signed)
Patient provided with ice chips and crackers for PO challenge.

## 2016-11-28 NOTE — ED Triage Notes (Signed)
Patient BIB EMS from [facility] for decreased bowel sounds and constipation since 1/7. Patient has distention and rigidity.

## 2017-04-21 DIAGNOSIS — R112 Nausea with vomiting, unspecified: Secondary | ICD-10-CM

## 2017-04-21 HISTORY — DX: Nausea with vomiting, unspecified: R11.2

## 2017-04-30 ENCOUNTER — Encounter (HOSPITAL_COMMUNITY): Payer: Self-pay

## 2017-04-30 ENCOUNTER — Emergency Department (HOSPITAL_COMMUNITY)

## 2017-04-30 ENCOUNTER — Inpatient Hospital Stay (HOSPITAL_COMMUNITY): Admission: EM | Admit: 2017-04-30 | Discharge: 2017-05-09 | DRG: 390 | Attending: Oncology | Admitting: Oncology

## 2017-04-30 DIAGNOSIS — Z87891 Personal history of nicotine dependence: Secondary | ICD-10-CM

## 2017-04-30 DIAGNOSIS — R1084 Generalized abdominal pain: Secondary | ICD-10-CM | POA: Diagnosis not present

## 2017-04-30 DIAGNOSIS — K5651 Intestinal adhesions [bands], with partial obstruction: Secondary | ICD-10-CM | POA: Diagnosis not present

## 2017-04-30 DIAGNOSIS — N433 Hydrocele, unspecified: Secondary | ICD-10-CM | POA: Diagnosis present

## 2017-04-30 DIAGNOSIS — Z886 Allergy status to analgesic agent status: Secondary | ICD-10-CM

## 2017-04-30 DIAGNOSIS — D649 Anemia, unspecified: Secondary | ICD-10-CM | POA: Diagnosis present

## 2017-04-30 DIAGNOSIS — B182 Chronic viral hepatitis C: Secondary | ICD-10-CM | POA: Diagnosis present

## 2017-04-30 DIAGNOSIS — Z79899 Other long term (current) drug therapy: Secondary | ICD-10-CM

## 2017-04-30 DIAGNOSIS — R112 Nausea with vomiting, unspecified: Secondary | ICD-10-CM | POA: Diagnosis present

## 2017-04-30 DIAGNOSIS — Z880 Allergy status to penicillin: Secondary | ICD-10-CM

## 2017-04-30 DIAGNOSIS — Z9049 Acquired absence of other specified parts of digestive tract: Secondary | ICD-10-CM

## 2017-04-30 DIAGNOSIS — R109 Unspecified abdominal pain: Secondary | ICD-10-CM

## 2017-04-30 DIAGNOSIS — N4400 Torsion of testis, unspecified: Secondary | ICD-10-CM

## 2017-04-30 DIAGNOSIS — Z8711 Personal history of peptic ulcer disease: Secondary | ICD-10-CM

## 2017-04-30 DIAGNOSIS — Z91041 Radiographic dye allergy status: Secondary | ICD-10-CM

## 2017-04-30 HISTORY — DX: Nausea with vomiting, unspecified: R11.2

## 2017-04-30 HISTORY — DX: Inflammatory liver disease, unspecified: K75.9

## 2017-04-30 LAB — COMPREHENSIVE METABOLIC PANEL
ALK PHOS: 78 U/L (ref 38–126)
ALT: 22 U/L (ref 17–63)
AST: 25 U/L (ref 15–41)
Albumin: 4.5 g/dL (ref 3.5–5.0)
Anion gap: 9 (ref 5–15)
BILIRUBIN TOTAL: 0.6 mg/dL (ref 0.3–1.2)
BUN: 12 mg/dL (ref 6–20)
CHLORIDE: 106 mmol/L (ref 101–111)
CO2: 25 mmol/L (ref 22–32)
Calcium: 9.8 mg/dL (ref 8.9–10.3)
Creatinine, Ser: 1.04 mg/dL (ref 0.61–1.24)
GFR calc Af Amer: >60 mL/min (ref >60)
Glucose, Bld: 89 mg/dL (ref 65–99)
Potassium: 4.3 mmol/L (ref 3.5–5.1)
Sodium: 140 mmol/L (ref 135–145)
TOTAL PROTEIN: 7.3 g/dL (ref 6.5–8.1)

## 2017-04-30 LAB — URINALYSIS, ROUTINE W REFLEX MICROSCOPIC
Bilirubin Urine: NEGATIVE
GLUCOSE, UA: NEGATIVE mg/dL
Hgb urine dipstick: NEGATIVE
KETONES UR: NEGATIVE mg/dL
LEUKOCYTES UA: NEGATIVE
Nitrite: NEGATIVE
Protein, ur: NEGATIVE mg/dL
Specific Gravity, Urine: 1.021 (ref 1.005–1.030)
pH: 6 (ref 5.0–8.0)

## 2017-04-30 LAB — CBC
HCT: 40.9 % (ref 39.0–52.0)
Hemoglobin: 13.8 g/dL (ref 13.0–17.0)
MCH: 29.9 pg (ref 26.0–34.0)
MCHC: 33.7 g/dL (ref 30.0–36.0)
MCV: 88.7 fL (ref 78.0–100.0)
Platelets: 305 10*3/uL (ref 150–400)
RBC: 4.61 MIL/uL (ref 4.22–5.81)
RDW: 13.7 % (ref 11.5–15.5)
WBC: 6.5 10*3/uL (ref 4.0–10.5)

## 2017-04-30 LAB — LIPASE, BLOOD: Lipase: 30 U/L (ref 11–51)

## 2017-04-30 MED ORDER — IOPAMIDOL (ISOVUE-300) INJECTION 61%
INTRAVENOUS | Status: AC
  Administered 2017-05-01: 100 mL
  Filled 2017-04-30: qty 100

## 2017-04-30 MED ORDER — ONDANSETRON HCL 4 MG/2ML IJ SOLN
4.0000 mg | Freq: Once | INTRAMUSCULAR | Status: AC
  Administered 2017-04-30: 4 mg via INTRAVENOUS
  Filled 2017-04-30: qty 2

## 2017-04-30 MED ORDER — DIPHENHYDRAMINE HCL 25 MG PO CAPS
50.0000 mg | ORAL_CAPSULE | Freq: Once | ORAL | Status: AC
  Filled 2017-04-30: qty 2

## 2017-04-30 MED ORDER — DIPHENHYDRAMINE HCL 50 MG/ML IJ SOLN
50.0000 mg | Freq: Once | INTRAMUSCULAR | Status: AC
  Administered 2017-05-01: 50 mg via INTRAVENOUS
  Filled 2017-04-30: qty 1

## 2017-04-30 MED ORDER — HYDROMORPHONE HCL 1 MG/ML IJ SOLN
1.0000 mg | Freq: Once | INTRAMUSCULAR | Status: AC
  Administered 2017-04-30: 1 mg via INTRAVENOUS
  Filled 2017-04-30: qty 1

## 2017-04-30 MED ORDER — HYDROCORTISONE NA SUCCINATE PF 250 MG IJ SOLR: 200.0000 mg | Freq: Once | INTRAMUSCULAR | Status: DC

## 2017-04-30 MED ORDER — HYDROCORTISONE NA SUCCINATE PF 250 MG IJ SOLR
200.0000 mg | Freq: Once | INTRAMUSCULAR | Status: AC
Start: 2017-04-30 — End: 2017-04-30
  Administered 2017-04-30: 200 mg via INTRAVENOUS
  Filled 2017-04-30: qty 200

## 2017-04-30 MED ORDER — METOCLOPRAMIDE HCL 5 MG/ML IJ SOLN
10.0000 mg | Freq: Once | INTRAMUSCULAR | Status: DC
  Filled 2017-04-30: qty 2
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
## 2017-04-30 NOTE — ED Notes (Signed)
Patient transported to X-ray 

## 2017-05-01 ENCOUNTER — Emergency Department (HOSPITAL_COMMUNITY)

## 2017-05-01 ENCOUNTER — Encounter (HOSPITAL_COMMUNITY): Payer: Self-pay | Admitting: Internal Medicine

## 2017-05-01 ENCOUNTER — Observation Stay (HOSPITAL_COMMUNITY)

## 2017-05-01 ENCOUNTER — Other Ambulatory Visit: Payer: Self-pay

## 2017-05-01 DIAGNOSIS — Z886 Allergy status to analgesic agent status: Secondary | ICD-10-CM

## 2017-05-01 DIAGNOSIS — D649 Anemia, unspecified: Secondary | ICD-10-CM

## 2017-05-01 DIAGNOSIS — K9049 Malabsorption due to intolerance, not elsewhere classified: Secondary | ICD-10-CM

## 2017-05-01 DIAGNOSIS — B182 Chronic viral hepatitis C: Secondary | ICD-10-CM

## 2017-05-01 DIAGNOSIS — Z91041 Radiographic dye allergy status: Secondary | ICD-10-CM

## 2017-05-01 DIAGNOSIS — R112 Nausea with vomiting, unspecified: Secondary | ICD-10-CM

## 2017-05-01 DIAGNOSIS — R1011 Right upper quadrant pain: Secondary | ICD-10-CM

## 2017-05-01 DIAGNOSIS — Z8249 Family history of ischemic heart disease and other diseases of the circulatory system: Secondary | ICD-10-CM

## 2017-05-01 DIAGNOSIS — Z8711 Personal history of peptic ulcer disease: Secondary | ICD-10-CM

## 2017-05-01 DIAGNOSIS — Z885 Allergy status to narcotic agent status: Secondary | ICD-10-CM

## 2017-05-01 DIAGNOSIS — Z9049 Acquired absence of other specified parts of digestive tract: Secondary | ICD-10-CM

## 2017-05-01 DIAGNOSIS — Z880 Allergy status to penicillin: Secondary | ICD-10-CM

## 2017-05-01 DIAGNOSIS — R1031 Right lower quadrant pain: Secondary | ICD-10-CM

## 2017-05-01 DIAGNOSIS — Z825 Family history of asthma and other chronic lower respiratory diseases: Secondary | ICD-10-CM

## 2017-05-01 LAB — COMPREHENSIVE METABOLIC PANEL
ALT: 19 U/L (ref 17–63)
AST: 16 U/L (ref 15–41)
Albumin: 3.6 g/dL (ref 3.5–5.0)
Alkaline Phosphatase: 68 U/L (ref 38–126)
Anion gap: 8 (ref 5–15)
BILIRUBIN TOTAL: 0.4 mg/dL (ref 0.3–1.2)
BUN: 11 mg/dL (ref 6–20)
CO2: 25 mmol/L (ref 22–32)
CREATININE: 0.97 mg/dL (ref 0.61–1.24)
Calcium: 8.6 mg/dL — ABNORMAL LOW (ref 8.9–10.3)
Chloride: 106 mmol/L (ref 101–111)
Glucose, Bld: 100 mg/dL — ABNORMAL HIGH (ref 65–99)
Potassium: 3.7 mmol/L (ref 3.5–5.1)
Sodium: 139 mmol/L (ref 135–145)
TOTAL PROTEIN: 6.2 g/dL — AB (ref 6.5–8.1)

## 2017-05-01 LAB — CBC
HCT: 34.9 % — ABNORMAL LOW (ref 39.0–52.0)
Hemoglobin: 11.4 g/dL — ABNORMAL LOW (ref 13.0–17.0)
MCH: 29.2 pg (ref 26.0–34.0)
MCHC: 32.7 g/dL (ref 30.0–36.0)
MCV: 89.3 fL (ref 78.0–100.0)
PLATELETS: 245 10*3/uL (ref 150–400)
RBC: 3.91 MIL/uL — ABNORMAL LOW (ref 4.22–5.81)
RDW: 13.9 % (ref 11.5–15.5)
WBC: 7.9 10*3/uL (ref 4.0–10.5)

## 2017-05-01 LAB — MRSA PCR SCREENING: MRSA BY PCR: POSITIVE — AB

## 2017-05-01 MED ORDER — PANTOPRAZOLE SODIUM 40 MG IV SOLR
40.0000 mg | Freq: Two times a day (BID) | INTRAVENOUS | Status: DC
  Administered 2017-05-01 – 2017-05-09 (×17): 40 mg via INTRAVENOUS
  Filled 2017-05-01 (×17): qty 40

## 2017-05-01 MED ORDER — PROMETHAZINE HCL 25 MG/ML IJ SOLN
12.5000 mg | Freq: Four times a day (QID) | INTRAMUSCULAR | Status: DC | PRN
  Administered 2017-05-05 – 2017-05-09 (×16): 12.5 mg via INTRAVENOUS
  Filled 2017-05-01 (×17): qty 1

## 2017-05-01 MED ORDER — SODIUM CHLORIDE 0.9 % IV BOLUS (SEPSIS)
1000.0000 mL | Freq: Once | INTRAVENOUS | Status: AC
  Administered 2017-05-01: 1000 mL via INTRAVENOUS

## 2017-05-01 MED ORDER — HYDROMORPHONE HCL 1 MG/ML IJ SOLN
1.0000 mg | Freq: Once | INTRAMUSCULAR | Status: AC
  Administered 2017-05-01: 1 mg via INTRAVENOUS
  Filled 2017-05-01: qty 1

## 2017-05-01 MED ORDER — HALOPERIDOL LACTATE 5 MG/ML IJ SOLN
2.0000 mg | Freq: Once | INTRAMUSCULAR | Status: AC
  Administered 2017-05-01: 2 mg via INTRAVENOUS
  Filled 2017-05-01: qty 1

## 2017-05-01 MED ORDER — POTASSIUM CHLORIDE IN NACL 20-0.9 MEQ/L-% IV SOLN
INTRAVENOUS | Status: AC
  Administered 2017-05-01 – 2017-05-04 (×8): via INTRAVENOUS
  Filled 2017-05-01 (×7): qty 1000

## 2017-05-01 MED ORDER — ONDANSETRON 4 MG PO TBDP
4.0000 mg | ORAL_TABLET | Freq: Once | ORAL | Status: AC
  Administered 2017-05-01: 4 mg via ORAL
  Filled 2017-05-01: qty 1

## 2017-05-01 MED ORDER — ONDANSETRON HCL 4 MG/2ML IJ SOLN
4.0000 mg | Freq: Once | INTRAMUSCULAR | Status: AC
  Administered 2017-05-01: 4 mg via INTRAVENOUS
  Filled 2017-05-01: qty 2

## 2017-05-01 MED ORDER — FENTANYL CITRATE (PF) 100 MCG/2ML IJ SOLN
50.0000 ug | Freq: Once | INTRAMUSCULAR | Status: AC
  Administered 2017-05-01: 50 ug via INTRAVENOUS
  Filled 2017-05-01: qty 2

## 2017-05-01 MED ORDER — HYDROCODONE-ACETAMINOPHEN 5-325 MG PO TABS
1.0000 | ORAL_TABLET | Freq: Once | ORAL | Status: AC
  Administered 2017-05-01: 1 via ORAL
  Filled 2017-05-01: qty 1

## 2017-05-01 MED ORDER — ACETAMINOPHEN 10 MG/ML IV SOLN
1000.0000 mg | Freq: Once | INTRAVENOUS | Status: AC
  Administered 2017-05-01: 1000 mg via INTRAVENOUS
  Filled 2017-05-01 (×2): qty 100

## 2017-05-01 MED ORDER — ONDANSETRON HCL 4 MG/2ML IJ SOLN
4.0000 mg | Freq: Four times a day (QID) | INTRAMUSCULAR | Status: DC | PRN
  Administered 2017-05-05 – 2017-05-08 (×13): 4 mg via INTRAVENOUS
  Filled 2017-05-01 (×13): qty 2

## 2017-05-01 MED ORDER — POTASSIUM CHLORIDE IN NACL 40-0.9 MEQ/L-% IV SOLN
INTRAVENOUS | Status: DC
  Filled 2017-05-01: qty 1000

## 2017-05-01 MED ORDER — OXYCODONE-ACETAMINOPHEN 5-325 MG PO TABS: 2.0000 | ORAL_TABLET | ORAL | 0 refills | Status: DC | PRN

## 2017-05-01 MED ORDER — HYDROCODONE-ACETAMINOPHEN 5-325 MG PO TABS
2.0000 | ORAL_TABLET | Freq: Once | ORAL | Status: AC
  Administered 2017-05-01: 2 via ORAL
  Filled 2017-05-01: qty 2

## 2017-05-01 MED ORDER — ONDANSETRON 4 MG PO TBDP: 4.0000 mg | ORAL_TABLET | Freq: Three times a day (TID) | ORAL | Status: DC | PRN

## 2017-05-01 MED ORDER — ONDANSETRON HCL 4 MG PO TABS: 4.0000 mg | ORAL_TABLET | Freq: Three times a day (TID) | ORAL | 0 refills | Status: DC | PRN

## 2017-05-01 MED ORDER — ENOXAPARIN SODIUM 40 MG/0.4ML ~~LOC~~ SOLN
40.0000 mg | SUBCUTANEOUS | Status: DC
  Administered 2017-05-02 – 2017-05-08 (×7): 40 mg via SUBCUTANEOUS
  Filled 2017-05-01 (×6): qty 0.4

## 2017-05-01 MED ORDER — TECHNETIUM TC 99M MEBROFENIN IV KIT
5.4700 | PACK | Freq: Once | INTRAVENOUS | Status: AC | PRN
  Administered 2017-05-01: 5.47 via INTRAVENOUS

## 2017-05-01 MED ORDER — ONDANSETRON HCL 4 MG/2ML IJ SOLN
4.0000 mg | Freq: Three times a day (TID) | INTRAMUSCULAR | Status: DC
  Administered 2017-05-01: 4 mg via INTRAVENOUS
  Filled 2017-05-01: qty 2

## 2017-05-01 NOTE — ED Notes (Signed)
Patient transported to CT 

## 2017-05-01 NOTE — ED Notes (Signed)
Patient transported to Ultrasound 

## 2017-05-01 NOTE — ED Notes (Signed)
Per Dorothea Ogle, [place], if pt cannot tolerate PO he will need admission.

## 2017-05-01 NOTE — ED Notes (Signed)
CT notified of benadryl given

## 2017-05-01 NOTE — ED Notes (Signed)
Hospitalists at pt's bedside.

## 2017-05-01 NOTE — H&P (Signed)
Date: 05/01/2017               Patient Name:  Travis Callahan MRN: [id number]  DOB: 1991/05/29 Age / Sex: 26 y.o., male   PCP: Patient, No Pcp Per              Medical Service: Internal Medicine Teaching Service              Attending Physician: Dr. Evette Doffing, Mallie Mussel, *    First Contact: Marylen Ponto, MS4 Pager: [phone]  Second Contact: Dr. Posey Pronto Pager: [phone]            After Hours (After 5p/  First Contact Pager: [phone]  weekends / holidays): Second Contact Pager: [phone]   Chief Complaint: nausea, vomiting, and abdominal pain  History of Present Illness: Travis Callahan is a 26 year old with history of perforated peptic ulcer complicated by peritonitis, h/o appendectomy, chronic HCV who presented to the ED with a 1 day history of nausea, vomiting, and abdominal pain.  He had a flare of nausea with non-bloody vomiting yesterday and developed sharp, stabbing right lower quadrant abdominal pain overnight. His pain was worse just after eating and lying flat but felt better when he lay completely still. The pain migrated to his left side and epigastric region. He routinely takes pantoprazole 40 mg twice daily and no recent medication use, including NSAIDs. He recalls a history of a perforated peptic ulcer complicated by peritonitis which he recalls was in March though records available in [place] report he was hospitalized for similar symptoms which improved with an enema; EGD at this time noted mild gastritis. He was recently hospitalized 5/26-5/29 at [facility] for witnessed hematemesis and underwent EGD with reassuring findings [report unavailable for review]. His hemoglobin at that time remained stabled at 11-12. The longest he recalls being without nausea and vomiting is 3 weeks since his March hospitalization. He denied any fevers, chills, sick contacts, diarrhea, hematochezia, melena, change in diet, urinary symptoms, testicular pain. 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  In the ED, XR abd did not show any signs of obstruction, abdominal CT noted minimal soft tissue inflammation, and RUQ ultrasound showed mild fluid collection without any evidence of gallstones.   Meds: Current Meds  Medication Sig  . diphenhydrAMINE (BENADRYL) 25 mg capsule Take 50 mg by mouth once.  . pantoprazole (PROTONIX) 40 MG tablet Take 40 mg by mouth 2 (two) times daily.  . predniSONE (STERAPRED UNI-PAK 21 TAB) 10 MG (21) TBPK tablet Take by mouth as directed.  . promethazine (PHENERGAN) 25 MG tablet Take 25 mg by mouth every 6 (six) hours as needed for nausea or vomiting.     Allergies: Allergies as of 04/30/2017 - Review Complete 04/30/2017  Allergen Reaction Noted  . Penicillins Other (See Comments) 06/18/2011  . Nsaids Other (See Comments) 06/18/2011  . Ivp dye [iodinated diagnostic agents] Hives, Itching, and Other (See Comments) 06/24/2011  . Ketorolac tromethamine Hives, Itching, and Other (See Comments) 12/25/2011  . Morphine and related Hives and Itching 10/10/2015  . Tramadol Nausea And Vomiting 12/25/2011   Past Medical History:  Diagnosis Date  . Bowel obstruction ([place])   . Crohn's   . Gastric ulcer   . MRSA (methicillin resistant Staphylococcus aureus)   . Peritonitis ([place]) 2015   After appendectomy, at [facility]  . Peritonitis ([place])   .  Skin infection    Multiple skin infections  . Stomach ulcer   . Ulcer     Family History:  His mother passed away at age 26 from COPD. His father passed away at age 42 from a heart attack.  Social History: As noted in the HPI.  Review of Systems: A 10-point review of systems was negative unless otherwise noted in the HPI.  Physical Exam: Blood pressure (!) 114/55, pulse (!) 55, temperature 98.1 F (36.7 C), temperature source Oral, resp. rate 11, height 5\' 8"   (1.727 m), weight 79.4 kg (175 lb), SpO2 99 %.  Gen:  Resting comfortably sitting up in bed.   Neuro:  Alert and oriented x3 HEENT:  Head: NCAT.  Eyes: EOMI. PERRLA.  Mouth: Moist mucous membranes.  Neck:  No thyromegaly.  CV: RRR with no murmurs, rubs, or gallops. Pulm:  CTAB, no wheezes or rhonchi. Abdomen: Vertical scar from prior surgical incision.  Severe tenderness over right-side most tender focally over RUQ. Positive Murphy's sign. Tenderness to percussion of right side.  Tenderness on right side with palpation of left.  No hepatosplenomegaly.  Back:  No CVA tenderness. Extremities:  No LE edema.  Pulses in all extremities intact. Skin: Warm and dry. No skin lesions appreciated.   Imaging  US abdomen FINDINGS: Gallbladder:  There is question of mild gallbladder wall edema, with a positive ultrasonographic Murphy's sign. No definite stones are seen. No pericholecystic fluid is appreciated. Would correlate clinically for evidence of cholecystitis.  CT Abdomen/Pelvis IMPRESSION: 1. Question of minimal soft tissue inflammation about the gallbladder. Would correlate for any evidence of mild cholecystitis. 2. Otherwise unremarkable contrast-enhanced CT of the abdomen and pelvis.   Assessment & Plan by Problem: Principal Problem:   Nausea and vomiting Active Problems:   Chronic hepatitis C without hepatic coma (HCC)   Status post appendectomy   History of peptic ulcer disease  Travis Callahan is a 26 year old with history of perforated peptic ulcer complicated by peritonitis, h/o appendectomy, chronic HCV hospitalized for 1-day history of nausea, vomiting, and abdominal pain.  Nausea, vomiting, abdominal pain: Given exam findings of RUQ pain, Murphy's sign, and US findings of mild gallbladder wall edema, acute cholecystitis is suspect though no stones were visualized. He is afebrile and without leukocytosis or LFT abnormalities. Peptic ulcer disease is less likely given  recent EGD findings, no NSAID use, adherence to PPI therapy, and CT imaging findings. Acute pancreatitis is less likely given reassuring lipase and LFTs. He recalls a prior appendectomy which is corroborated on imaging. Multiple UDS in the past have noted marijuana, most recently January 2017. - Keep NPO - Order HIDA scan for suspected cholecystitis.  - Continue NS @ 100 ml/hr with 20 mEq K given NPO status - Give acetaminophen 650 mg IV for pain given allergies to morphine and no NSAIDs given history of PUD - Continue promethazine 12.5 mg every 6 hours as needed for nausea alternating with ondansetron 4 mg every 8 hours - Order EKG to check QTc - Check UDS - Consider workup for other causes of chronic nausea and vomiting, like gastroparesis, hyperthyroidism, adrenal insufficiency, should he not have cholecystitis  Chronic Hepatitis C:  Positive Hep C antibody titers in Jan 2017.  Per patient has not been treated to date.  -Recommend outpatient follow-up \ -Recheck HIV  Chronic normocytic anemia: Hb 11.4, baseline 11-12 per Care Everywhere. -Trend CBC  FEN: 100 ml/hr NS with 20 mEq K  VTE ppx: Lovenox  Code status:  Full  Signed: [name], Medical Student 05/01/2017, 1:07 PM   Attestation for Student Documentation:  I personally was present and performed or re-performed the history, physical exam and medical decision-making activities of this service and have verified that the service and findings are accurately documented in the student's note.  Riccardo Dubin, MD 05/01/2017, 2:36 PM

## 2017-05-01 NOTE — ED Provider Notes (Signed)
PROGRESS NOTE                                                                                                                 This is a sign-out from PA Leapart at shift change: Apolonio R Xxxcullingham is a 27 y.o. male presenting with abdominal pain and emesis. Vital signs reassuring, blood work reassuring, CAT scan showed some mild peri-cholecystic fluid but ultrasound is reassuring, PA Leapheart discussed with surgeon Dr. Grandville Silos who, per PA, declines emergent evaluation in the ED. States he can follow-up as an outpatient. Upon discharge patient was noted to be vomiting. Plan is to give Reglan and Haldol and by mouth challenge. Plan is to give Haldol and by mouth challenge. Patient has had a significant amount of pain medication throughout the night however he has a history of IV heroin use. Please refer to previous note for full HPI, ROS, PMH and PE.  Patient seen and evaluated the bedside, abdominal exam nonsurgical. States that the nausea has improved since the Haldol, I've encouraged him to attempt to by mouth challenge. He states that the pain in his abdomen is also recurring, patient given 2 Vicodin. Vital signs remain reassuring.   RN Observed a copious amount of emesis. Will need admission for intractable emesis  Discussed case with [place]  who will evaluate the patient and put him holding orders.          Waynetta Pean 05/01/17 [place], [place], MD 05/03/17 [phone]

## 2017-05-01 NOTE — ED Notes (Signed)
ED Provider at bedside. 

## 2017-05-01 NOTE — ED Notes (Signed)
Pt given ginger ale for PO challenge.  Per pt he was unable to keep the ginger ale down.  Will notify PA.

## 2017-05-01 NOTE — ED Notes (Signed)
Attempted to call report

## 2017-05-01 NOTE — ED Notes (Signed)
Report attempted 

## 2017-05-02 DIAGNOSIS — K5651 Intestinal adhesions [bands], with partial obstruction: Secondary | ICD-10-CM | POA: Diagnosis present

## 2017-05-02 DIAGNOSIS — Z87891 Personal history of nicotine dependence: Secondary | ICD-10-CM | POA: Diagnosis not present

## 2017-05-02 DIAGNOSIS — Z880 Allergy status to penicillin: Secondary | ICD-10-CM | POA: Diagnosis not present

## 2017-05-02 DIAGNOSIS — R1084 Generalized abdominal pain: Secondary | ICD-10-CM | POA: Diagnosis present

## 2017-05-02 DIAGNOSIS — Z8719 Personal history of other diseases of the digestive system: Secondary | ICD-10-CM

## 2017-05-02 DIAGNOSIS — Z9049 Acquired absence of other specified parts of digestive tract: Secondary | ICD-10-CM | POA: Diagnosis not present

## 2017-05-02 DIAGNOSIS — Z8711 Personal history of peptic ulcer disease: Secondary | ICD-10-CM | POA: Diagnosis not present

## 2017-05-02 DIAGNOSIS — B182 Chronic viral hepatitis C: Secondary | ICD-10-CM | POA: Diagnosis present

## 2017-05-02 DIAGNOSIS — Z79899 Other long term (current) drug therapy: Secondary | ICD-10-CM | POA: Diagnosis not present

## 2017-05-02 DIAGNOSIS — Z886 Allergy status to analgesic agent status: Secondary | ICD-10-CM | POA: Diagnosis not present

## 2017-05-02 DIAGNOSIS — N433 Hydrocele, unspecified: Secondary | ICD-10-CM | POA: Diagnosis present

## 2017-05-02 DIAGNOSIS — Z91041 Radiographic dye allergy status: Secondary | ICD-10-CM | POA: Diagnosis not present

## 2017-05-02 DIAGNOSIS — D649 Anemia, unspecified: Secondary | ICD-10-CM | POA: Diagnosis present

## 2017-05-02 LAB — CBC
HCT: 36.2 % — ABNORMAL LOW (ref 39.0–52.0)
Hemoglobin: 11.6 g/dL — ABNORMAL LOW (ref 13.0–17.0)
MCH: 28.9 pg (ref 26.0–34.0)
MCHC: 32 g/dL (ref 30.0–36.0)
MCV: 90.3 fL (ref 78.0–100.0)
PLATELETS: 226 10*3/uL (ref 150–400)
RBC: 4.01 MIL/uL — ABNORMAL LOW (ref 4.22–5.81)
RDW: 14.1 % (ref 11.5–15.5)
WBC: 4.3 10*3/uL (ref 4.0–10.5)

## 2017-05-02 LAB — RAPID URINE DRUG SCREEN, HOSP PERFORMED
AMPHETAMINES: NOT DETECTED
BENZODIAZEPINES: NOT DETECTED
Barbiturates: NOT DETECTED
Cocaine: NOT DETECTED
OPIATES: POSITIVE — AB
Tetrahydrocannabinol: NOT DETECTED

## 2017-05-02 LAB — COMPREHENSIVE METABOLIC PANEL
ALBUMIN: 3.5 g/dL (ref 3.5–5.0)
ALT: 18 U/L (ref 17–63)
AST: 17 U/L (ref 15–41)
Alkaline Phosphatase: 62 U/L (ref 38–126)
Anion gap: 8 (ref 5–15)
BILIRUBIN TOTAL: 1.1 mg/dL (ref 0.3–1.2)
BUN: 11 mg/dL (ref 6–20)
CO2: 25 mmol/L (ref 22–32)
CREATININE: 0.99 mg/dL (ref 0.61–1.24)
Calcium: 8.7 mg/dL — ABNORMAL LOW (ref 8.9–10.3)
Chloride: 106 mmol/L (ref 101–111)
GFR calc Af Amer: >60 mL/min (ref >60)
GLUCOSE: 88 mg/dL (ref 65–99)
Potassium: 3.9 mmol/L (ref 3.5–5.1)
Sodium: 139 mmol/L (ref 135–145)
Total Protein: 5.7 g/dL — ABNORMAL LOW (ref 6.5–8.1)

## 2017-05-02 LAB — HEMOGLOBIN A1C
Hgb A1c MFr Bld: 5.1 % (ref 4.8–5.6)
MEAN PLASMA GLUCOSE: 100 mg/dL

## 2017-05-02 LAB — HIV ANTIBODY (ROUTINE TESTING W REFLEX): HIV Screen 4th Generation wRfx: NONREACTIVE

## 2017-05-02 MED ORDER — ACETAMINOPHEN 500 MG PO TABS: 1000.0000 mg | ORAL_TABLET | Freq: Four times a day (QID) | ORAL | Status: DC | PRN

## 2017-05-02 MED ORDER — BACITRACIN ZINC 500 UNIT/GM EX OINT
1.0000 "application " | TOPICAL_OINTMENT | Freq: Two times a day (BID) | CUTANEOUS | Status: DC
  Administered 2017-05-02 – 2017-05-09 (×15): 1 via TOPICAL
  Filled 2017-05-02 (×2): qty 28.35

## 2017-05-02 MED ORDER — CHLORHEXIDINE GLUCONATE CLOTH 2 % EX PADS
6.0000 | MEDICATED_PAD | Freq: Every day | CUTANEOUS | Status: DC
  Administered 2017-05-02 – 2017-05-08 (×7): 6 via TOPICAL

## 2017-05-02 MED ORDER — HYDROMORPHONE HCL 1 MG/ML IJ SOLN
1.0000 mg | Freq: Four times a day (QID) | INTRAMUSCULAR | Status: DC | PRN
  Administered 2017-05-02 – 2017-05-03 (×4): 1 mg via INTRAVENOUS
  Filled 2017-05-02 (×4): qty 1

## 2017-05-02 MED ORDER — ACETAMINOPHEN 325 MG PO TABS
650.0000 mg | ORAL_TABLET | ORAL | Status: DC | PRN
  Administered 2017-05-02 – 2017-05-03 (×2): 650 mg via ORAL
  Filled 2017-05-02 (×2): qty 2

## 2017-05-02 MED ORDER — HYDROMORPHONE HCL 1 MG/ML IJ SOLN
1.0000 mg | Freq: Once | INTRAMUSCULAR | Status: AC
  Administered 2017-05-02: 1 mg via INTRAVENOUS
  Filled 2017-05-02: qty 1

## 2017-05-02 NOTE — Progress Notes (Signed)
Subjective: He states that he is feeling much better in terms of both nausea in abdominal pain this morning without any more emesis.  He has not had a bowel movement over the past 24 hours.    Clarified today that surgery in March was for a SBO.  He states that his most recent history of perforated peptic ulcers began in 2016 and most recently 2017 though he has had at least 3 major abdominal surgeries.    Objective:  Vital signs in last 24 hours: Vitals:   05/01/17 1200 05/01/17 1247 05/01/17 2211 05/02/17 0531  BP: (!) 110/56 (!) 114/55 112/63 122/70  Pulse: (!) 58 (!) 55 (!) 55 61  Resp: 11  18 10   Temp:  98.1 F (36.7 C) 98.4 F (36.9 C) 97.7 F (36.5 C)  TempSrc:  Oral Oral Oral  SpO2: 97% 99% 97% 98%  Weight:      Height:       Weight change:   Intake/Output Summary (Last 24 hours) at 05/02/17 1043 Last data filed at 05/02/17 1024  Gross per 24 hour  Intake              695 ml  Output             1675 ml  Net             -980 ml   Gen: Well-appearing in NAD.  Sitting up comfortably in bed.   Neuro:  Alert and oriented x3.  HEENT: NCAT. EOMI. PERRLA. Moist mucous membranes.  CV: RRR with no murmurs, rubs, or gallops. Pulm:  CTAB, no wheezes or rhonchi. Abdomen:  Large vertical scar from previous surgeries.  Normal BS appreciated.  Extremities: No skin lesions appreciated.  No LE edema.  Pulses in all extremities intact.   HIDA scan FINDINGS: Prompt uptake and biliary excretion of activity by the liver is seen. Gallbladder activity is visualized, consistent with patency of cystic duct. Biliary activity passes into small bowel, consistent with patent common bile duct.  IMPRESSION: 1. Patent cystic duct without evidence for acute cholecystitis.   Assessment/Plan:  Principal Problem:   Nausea and vomiting Active Problems:   Chronic hepatitis C without hepatic coma (HCC)   Status post appendectomy   History of peptic ulcer disease   Mr. Gola is  a 26 year old with history of perforated peptic ulcer complicated by peritonitis, h/o appendectomy, chronic HCV hospitalized for 1-day history of nausea, vomiting, and abdominal pain.  Nausea, vomiting, abdominal pain: Given extensive abdominal surgery history, symptoms are likely 2/2 partial bowel obstruction. This is not a complete obstruction given imaging findings. HIDA scan reassuring for no cholecystitis. UDS also without THC which could account for some of his symptoms. A1c reassuring for no diabetic gastroparesis though it could be from other causes, like surgery, and requires outpatient workup. We will treat with supportive care and advance his diet as tolerated.   - Advance to full liquid diet today but de-escalate to clears if he is unable to tolerate - Continue NS @ 100 ml/hr with 20 mEq K given limited diet - Continue promethazine 12.5 mg every 6 hours as needed for nausea alternating with ondansetron 4 mg every 8 hours  Chronic Hepatitis C:  Positive Hep C antibody titers in Jan 2017.  Per patient has not been treated to date.  -Recommend outpatient follow-up  -Follow-up HIV  Chronic normocytic anemia: Hgb 11.6. Baseline 11-12 per Care Everywhere. Iron studies from 02/11/17 noted low normal  iron 87, normal TIBC 373 reassuring for no over iron deficiency though ferritin was 15 in 02/21/15. Interestingly enough Vitamin B12 was 238 in March 2018, and he is at risk for deficiency given chronic PPI use. -Check ferritin and vitamin B12  FEN: 100 ml/hr NS with 20 mEq K  VTE ppx: Lovenox   Code status:  Full   LOS: 0 days   [name], Medical Student 05/02/2017, 10:43 AM  Attestation for Student Documentation:  I personally was present and performed or re-performed the history, physical exam and medical decision-making activities of this service and have verified that the service and findings are accurately documented in the student's note.  Riccardo Dubin, MD 05/02/2017,  11:16 AM

## 2017-05-02 NOTE — Progress Notes (Signed)
Patient continues to c/o RLQ abdominal pain 10/10 at this despite pain med given. MD on call notified with new order.

## 2017-05-02 NOTE — Care Management Note (Signed)
Case Management Note  Patient Details  Name: Travis Callahan MRN: [id number] Date of Birth: 04/09/1991  Subjective/Objective:                    Action/Plan:  Patient is an inmate. Spoke with [place]  phone [phone] and faxed [phone] all required information.  Expected Discharge Date:                  Expected Discharge Plan:  Corrections Facility  In-House Referral:     Discharge planning Services     Post Acute Care Choice:    Choice offered to:     DME Arranged:    DME Agency:     HH Arranged:    [place] Agency:     Status of Service:  In process, will continue to follow  If discussed at Long Length of Stay Meetings, dates discussed:    Additional Comments:  Marilu Favre, RN 05/02/2017, 12:43 PM

## 2017-05-03 ENCOUNTER — Inpatient Hospital Stay (HOSPITAL_COMMUNITY)

## 2017-05-03 ENCOUNTER — Encounter (HOSPITAL_COMMUNITY): Payer: Self-pay | Admitting: General Practice

## 2017-05-03 DIAGNOSIS — K56609 Unspecified intestinal obstruction, unspecified as to partial versus complete obstruction: Secondary | ICD-10-CM

## 2017-05-03 LAB — BASIC METABOLIC PANEL
ANION GAP: 9 (ref 5–15)
BUN: 5 mg/dL — ABNORMAL LOW (ref 6–20)
CO2: 25 mmol/L (ref 22–32)
Calcium: 8.7 mg/dL — ABNORMAL LOW (ref 8.9–10.3)
Chloride: 103 mmol/L (ref 101–111)
Creatinine, Ser: 0.87 mg/dL (ref 0.61–1.24)
GFR calc Af Amer: >60 mL/min (ref >60)
GFR calc non Af Amer: >60 mL/min (ref >60)
GLUCOSE: 89 mg/dL (ref 65–99)
Potassium: 3.5 mmol/L (ref 3.5–5.1)
Sodium: 137 mmol/L (ref 135–145)

## 2017-05-03 LAB — CBC
HEMATOCRIT: 36.6 % — AB (ref 39.0–52.0)
Hemoglobin: 11.8 g/dL — ABNORMAL LOW (ref 13.0–17.0)
MCH: 28.8 pg (ref 26.0–34.0)
MCHC: 32.2 g/dL (ref 30.0–36.0)
MCV: 89.3 fL (ref 78.0–100.0)
Platelets: 274 10*3/uL (ref 150–400)
RBC: 4.1 MIL/uL — ABNORMAL LOW (ref 4.22–5.81)
RDW: 13.6 % (ref 11.5–15.5)
WBC: 4.3 10*3/uL (ref 4.0–10.5)

## 2017-05-03 LAB — VITAMIN B12: Vitamin B-12: 257 pg/mL (ref 180–914)

## 2017-05-03 LAB — FERRITIN: Ferritin: 30 ng/mL (ref 24–336)

## 2017-05-03 MED ORDER — HYDROMORPHONE HCL 1 MG/ML IJ SOLN
0.5000 mg | Freq: Four times a day (QID) | INTRAMUSCULAR | Status: DC | PRN
  Administered 2017-05-03 – 2017-05-08 (×19): 0.5 mg via INTRAVENOUS
  Filled 2017-05-03 (×22): qty 1

## 2017-05-03 MED ORDER — SENNOSIDES-DOCUSATE SODIUM 8.6-50 MG PO TABS: 1.0000 | ORAL_TABLET | Freq: Two times a day (BID) | ORAL | Status: DC

## 2017-05-03 MED ORDER — SENNOSIDES-DOCUSATE SODIUM 8.6-50 MG PO TABS
1.0000 | ORAL_TABLET | Freq: Two times a day (BID) | ORAL | Status: DC
  Administered 2017-05-03: 1 via ORAL
  Filled 2017-05-03: qty 1

## 2017-05-03 MED ORDER — OXYCODONE-ACETAMINOPHEN 5-325 MG PO TABS
1.0000 | ORAL_TABLET | Freq: Four times a day (QID) | ORAL | Status: DC | PRN
  Administered 2017-05-03 – 2017-05-08 (×21): 1 via ORAL
  Filled 2017-05-03 (×21): qty 1

## 2017-05-03 NOTE — Care Management (Signed)
Patient is an inmate. Spoke with [place]  phone [phone] and faxed [phone] all required information.  [place] [phone]

## 2017-05-03 NOTE — Progress Notes (Signed)
Averell Blanks, Licensed conveyancer from [employer], wanted to know if pt. Was stable enough to transfer to [facility]. MD notified and given all contact info.

## 2017-05-03 NOTE — Progress Notes (Addendum)
Subjective:  Travis Callahan states that he had some increased nausea and abd pain overnight after his liquid diet yesterday.  When he was seen around 45 minutes after starting the meal he did not have any pain.    Overnight he developed new L sided testicular pain and swelling.  He denies any discharge or dysuria and also states he has not have any recent sexual contact.  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  Additionally he has had a cystoscopy for urinary retention and US of the scrotum for a hydrocele in December of 2017.    Objective:  Vital signs in last 24 hours: Vitals:   05/02/17 0531 05/02/17 1351 05/02/17 2131 05/03/17 0508  BP: 122/70 125/60 (!) 154/86 (!) 146/94  Pulse: 61 74 64 (!) 103  Resp: 10 12 18 18   Temp: 97.7 F (36.5 C) 98.6 F (37 C) 98.8 F (37.1 C) 98.6 F (37 C)  TempSrc: Oral Oral Oral Oral  SpO2: 98% 98% 98% 96%  Weight:      Height:       Weight change:   Intake/Output Summary (Last 24 hours) at 05/03/17 1124 Last data filed at 05/03/17 0900  Gross per 24 hour  Intake             1765 ml  Output             3350 ml  Net            -1585 ml   Gen: Sitting up in bed in NAD.   Neuro:  Alert and oriented x3.   HEENT: NCAT. EOMI. Moist mucous membranes.  CV: RRR with no murmurs, rubs, or gallops. Pulm:  CTAB, no wheezes or rhonchi. Abdomen: Tense and tender to palpation of the right side with increased tenderness over the RUQ.  BS appreciated.  GU:  Left sided testicular and epididymal enlargement.  Diffusely tender L testes with focal tenderness of epidiymis.   Extremities: No skin lesions appreciated.  No LE edema.  Pulses in all extremities intact.  Assessment/Plan:  Principal Problem:   Nausea and vomiting Active Problems:   Chronic hepatitis C without hepatic coma (HCC)   Status post appendectomy   History of peptic ulcer disease  Travis Callahan is a 26 year old with history of perforated peptic  ulcer complicated by peritonitis, h/oappendectomy, h/o L sided cryptorchidism, and chronic HCV hospitalized for acute onset nausea, vomiting, and abdominal pain with new onset testicular pain.  Nausea, vomiting, abdominal pain 2/2 partial bowel obstruction:Did not do well yesterday with nausea on liquid diet.  Will hold off on advancing his diet today and treat partial obstruction with supportive care.   - Keep on clear liquid diet - Continue NS @ 100 ml/hr with 20 mEq K given limited diet - Continue promethazine12.5 mg every 6 hours as neededfor nausea alternating with ondansetron 4 mg every 8 hours  Left hydrocele: Exam most consistent with epididymitis vs hydrocele. Doppler consistent with hydrocele w/o evidence of epididymitis or torsion. Timeline and severity of symptoms are not consistent with initial presentation. He denied any testicular complaints yesterday.  He is afebrile with normal WBC.  Chronic Hepatitis C:Positive Hep C antibody titers in Jan 2017. Per patient untreated to date. HIV negative.  -Recommend outpatient follow-up   Chronic normocytic anemia: Hgb 11.6. Baseline 11-12 per Care Everywhere. Ferritin and B12 are low normal at 30 and 257 respectively. Given GI complaints will not supplement Fe at  this time.   FEN: 100 ml/hr NS with 20 mEq K  VTE ppx: Lovenox  Code status: Full    LOS: 1 day   [name], Medical Student 05/03/2017, 11:24 AM  Attestation for Student Documentation:  I personally was present and performed or re-performed the history, physical exam and medical decision-making activities of this service and have verified that the service and findings are accurately documented in the student's note.  Zada Finders, MD 05/03/2017, 12:48 PM

## 2017-05-04 LAB — BASIC METABOLIC PANEL
ANION GAP: 9 (ref 5–15)
BUN: <5 mg/dL — ABNORMAL LOW (ref 6–20)
CALCIUM: 8.9 mg/dL (ref 8.9–10.3)
CHLORIDE: 104 mmol/L (ref 101–111)
CO2: 24 mmol/L (ref 22–32)
Creatinine, Ser: 0.89 mg/dL (ref 0.61–1.24)
GFR calc Af Amer: >60 mL/min (ref >60)
GFR calc non Af Amer: >60 mL/min (ref >60)
GLUCOSE: 88 mg/dL (ref 65–99)
Potassium: 3.9 mmol/L (ref 3.5–5.1)
Sodium: 137 mmol/L (ref 135–145)

## 2017-05-04 LAB — CBC
HEMATOCRIT: 37.5 % — AB (ref 39.0–52.0)
HEMOGLOBIN: 12.3 g/dL — AB (ref 13.0–17.0)
MCH: 29.4 pg (ref 26.0–34.0)
MCHC: 32.8 g/dL (ref 30.0–36.0)
MCV: 89.5 fL (ref 78.0–100.0)
Platelets: 257 10*3/uL (ref 150–400)
RBC: 4.19 MIL/uL — AB (ref 4.22–5.81)
RDW: 13.7 % (ref 11.5–15.5)
WBC: 4 10*3/uL (ref 4.0–10.5)

## 2017-05-04 MED ORDER — ACETAMINOPHEN 500 MG PO TABS
1000.0000 mg | ORAL_TABLET | Freq: Three times a day (TID) | ORAL | Status: DC | PRN
  Administered 2017-05-06 – 2017-05-08 (×2): 1000 mg via ORAL
  Filled 2017-05-04 (×3): qty 2

## 2017-05-04 MED ORDER — SENNOSIDES-DOCUSATE SODIUM 8.6-50 MG PO TABS
2.0000 | ORAL_TABLET | Freq: Two times a day (BID) | ORAL | Status: DC
  Administered 2017-05-04 – 2017-05-06 (×6): 2 via ORAL
  Filled 2017-05-04 (×8): qty 2

## 2017-05-04 MED ORDER — POLYETHYLENE GLYCOL 3350 17 G PO PACK
17.0000 g | PACK | Freq: Three times a day (TID) | ORAL | Status: DC
  Administered 2017-05-04 – 2017-05-06 (×9): 17 g via ORAL
  Filled 2017-05-04 (×12): qty 1

## 2017-05-04 NOTE — Care Management (Signed)
Patient is an inmate. Spoke with [place] phone [phone] and faxed [phone] all required information.  [place] [phone]

## 2017-05-04 NOTE — Progress Notes (Signed)
Pt complaining of severe stabbing pain of the right side of the abdomen. Pt have good bowel sound and abd is soft. Made on call aware that pt is requesting additional pain meds. No new order at this time. Will continue to monitor pt and will give prn pain meds in timely manner.

## 2017-05-04 NOTE — Progress Notes (Signed)
Subjective:  Travis Callahan says he did have some increased nausea and pain last night with liquids.  He says the abd pain was about at baseline this morning.  At lunch time, he did have an increase in abd pain after eating on his clear liquid diet.  He states this quickly resolved after miaralax and Senakot, but still has yet to have a BM.     Objective:  Vital signs in last 24 hours: Vitals:   05/03/17 0508 05/03/17 1554 05/03/17 2031 05/04/17 0441  BP: (!) 146/94 (!) 119/54 126/75 120/68  Pulse: (!) 103 64 (!) 56 (!) 56  Resp: 18  18 18   Temp: 98.6 F (37 C) 97.5 F (36.4 C) 98.4 F (36.9 C) 98.1 F (36.7 C)  TempSrc: Oral Oral Oral Oral  SpO2: 96% 100% 98% 98%  Weight:      Height:       Weight change:   Intake/Output Summary (Last 24 hours) at 05/04/17 1054 Last data filed at 05/04/17 0437  Gross per 24 hour  Intake          1431.67 ml  Output             1775 ml  Net          -343.33 ml   Gen: Lying comfortably in bed in NAD.   Neuro:  Alert and oriented x3.   HEENT: NCAT. EOMI. Moist mucous membranes.  CV: RRR with no murmurs, rubs, or gallops. Pulm:  CTAB, no wheezes or rhonchi. Abdomen: Right sided abd tenderness most localized to RUQ.  Hyperactive bowel sounds.   Extremities: No skin lesions appreciated.  No LE edema.  Pulses in all extremities intact.  Assessment/Plan:  Principal Problem:   Nausea and vomiting Active Problems:   Chronic hepatitis C without hepatic coma (HCC)   Status post appendectomy   History of peptic ulcer disease  Travis Callahan is a 26 year old with history of perforated peptic ulcer complicated by peritonitis, h/oappendectomy, and chronic HCV hospitalized for acute onset nausea, vomiting, and abdominal pain likely 2/2 partial bowel obstruction.  Nausea and abdominal pain 2/2 partial bowel obstruction:Had some more nausea on a clear liquid diet.  Will keep clear liquid diet today and continue to treat partial obstruction with  supportive care. He still has not had a bowel movement.  - Keep on clear liquid diet - Increase bowel regimen to Miralax TID and Senakot-S 2 tabs BID  - Continue NS @ 100 ml/hr with 20 mEq K given limited diet - Continue promethazine12.5 mg every 6 hours as neededfor nausea alternating with ondansetron 4 mg every 8 hours - Tylenol 1000 mg q8h prn for pain  Chronic Hepatitis C:Positive Hep C antibody titers in Jan 2017. Per patient untreated to date. HIV negative.  -Recommend outpatient follow-up   Chronic normocytic anemia: Hgb 12.3 today with baseline 11-12 per Care Everywhere. B12 and ferritin at low normal, but will not give Fe supplementation given this would exacerbate his lack of bowel movements.   FEN: 100 ml/hr NS with 20 mEq K VTE ppx: Lovenox Code status: Full    LOS: 2 days   [name], Medical Student 05/04/2017, 10:54 AM   Attestation for Student Documentation:  I personally was present and performed or re-performed the history, physical exam and medical decision-making activities of this service and have verified that the service and findings are accurately documented in the student's note.  Zada Finders, MD 05/04/2017, 1:46 PM

## 2017-05-05 ENCOUNTER — Inpatient Hospital Stay (HOSPITAL_COMMUNITY)

## 2017-05-05 LAB — COMPREHENSIVE METABOLIC PANEL
ALBUMIN: 4 g/dL (ref 3.5–5.0)
ALT: 22 U/L (ref 17–63)
ANION GAP: 8 (ref 5–15)
AST: 20 U/L (ref 15–41)
Alkaline Phosphatase: 69 U/L (ref 38–126)
BILIRUBIN TOTAL: 1.2 mg/dL (ref 0.3–1.2)
BUN: 5 mg/dL — ABNORMAL LOW (ref 6–20)
CHLORIDE: 104 mmol/L (ref 101–111)
CO2: 25 mmol/L (ref 22–32)
Calcium: 9.3 mg/dL (ref 8.9–10.3)
Creatinine, Ser: 0.99 mg/dL (ref 0.61–1.24)
GFR calc Af Amer: >60 mL/min (ref >60)
GFR calc non Af Amer: >60 mL/min (ref >60)
GLUCOSE: 93 mg/dL (ref 65–99)
POTASSIUM: 4 mmol/L (ref 3.5–5.1)
SODIUM: 137 mmol/L (ref 135–145)
TOTAL PROTEIN: 6.7 g/dL (ref 6.5–8.1)

## 2017-05-05 LAB — LIPASE, BLOOD: Lipase: 19 U/L (ref 11–51)

## 2017-05-05 MED ORDER — POTASSIUM CHLORIDE IN NACL 20-0.9 MEQ/L-% IV SOLN
INTRAVENOUS | Status: DC
  Administered 2017-05-05: 10:00:00 via INTRAVENOUS
  Filled 2017-05-05: qty 1000

## 2017-05-05 MED ORDER — KCL IN DEXTROSE-NACL 20-5-0.45 MEQ/L-%-% IV SOLN
INTRAVENOUS | Status: AC
  Administered 2017-05-05 – 2017-05-06 (×4): via INTRAVENOUS
  Filled 2017-05-05 (×5): qty 1000

## 2017-05-05 NOTE — Care Management (Signed)
Patient is an inmate.[place] phone [phone] and faxed [phone] all required information.  [place] [phone]

## 2017-05-05 NOTE — Progress Notes (Signed)
Subjective:  Travis Callahan says he is feeling worse today with more nausea, abdominal pain, and now emesis.  He has had two episodes of emesis this morning.  The first around 4 AM after drinking some water and taking his pain medications and the second after drinking some water around 7:15 in the morning.  He has not taken his PRN zofran or phenergan.  Additionally, he has not had a bowel movement, but has taken his PRN percocet and dilaudid q6 over the last day.   Objective:  Vital signs in last 24 hours: Vitals:   05/04/17 0441 05/04/17 1612 05/04/17 2157 05/05/17 0626  BP: 120/68 115/62 128/80 122/77  Pulse: (!) 56 (!) 59 (!) 50 (!) 52  Resp: 18  19 18   Temp: 98.1 F (36.7 C) 98.3 F (36.8 C) 98.4 F (36.9 C) 97.9 F (36.6 C)  TempSrc: Oral Oral Oral   SpO2: 98% 98% 97% 97%  Weight:      Height:       Weight change:   Intake/Output Summary (Last 24 hours) at 05/05/17 0838 Last data filed at 05/05/17 0600  Gross per 24 hour  Intake          3018.33 ml  Output             1575 ml  Net          1443.33 ml   Gen: Lying uncomfortably in bed.  Emesis in bag is mostly water.  Neuro:  Alert and oriented x3.   HEENT: NCAT. EOMI. Moist mucous membranes.  CV: RRR with no murmurs, rubs, or gallops. Pulm:  CTAB, no wheezes or rhonchi. Abdomen: Unchanged abdominal exam. Tender on right side without rebound tenderness.  Palpation of left side reproduces pain on the right.  BS appreciated.  Extremities: No skin lesions appreciated.  No LE edema.   Assessment/Plan:  Principal Problem:   Nausea and vomiting Active Problems:   Chronic hepatitis C without hepatic coma (HCC)   Status post appendectomy   History of peptic ulcer disease   Mr. Travis Callahan is a 26 year old with history of perforated peptic ulcer complicated by peritonitis, h/oappendectomy, andchronic HCV hospitalized for acute onsetnausea, vomiting, and abdominal pain likely 2/2 partial bowel obstruction.  Nausea  and abdominal pain 2/2 partial bowel obstruction: Patient reports increased nausea and now emesis on clear liquid diet. He still has not had a bowel movement.  We still think this is partial obstruction of small bowel. He does not report any infectious symptoms and workup including negative CT scan, lipase, HIDA, and recent negative EGD.  Additionally, we will encourage him and have talked to nursing staff about taking his PRN nausea medications before taking pills and decreasing opioid use if possible as this is not helping his BMs.  XR and CT of abdomen 4 days ago did not show obstruction, but will repeat today given progression of symptoms.   - NPO given not tolerating diet - Abdominal XR to investigate out complete obstruction - Continue and encourage use of promethazine12.5 mg every 6 hours as neededfor nausea alternating with ondansetron 4 mg every 8 hours - If nausea does not improve, will start an NG tube for decompression - We will recheck LFTs and Lipase today - Keep bowel regimen of Miralax TID and Senakot-S 2 tabs BID  - D5 1/2NS w/ 20 mEq K given NPO.  Will consider TPN tomorrow if he does not improve. - Tylenol 1000 mg q8h prn for pain  Chronic Hepatitis C:Positive Hep C antibody titers in Jan 2017. Untreated to date. HIV negative.  -Recommend outpatient follow-up   Chronic normocytic anemia: Baseline 11-12 per Care Everywhere. B12 and ferritin at low normal, but will not exacerbate his lack of bowel movements with Fe supplementation.    LOS: 3 days   [name], Medical Student 05/05/2017, 8:38 AM  Attestation for Student Documentation:  I personally was present and performed or re-performed the history, physical exam and medical decision-making activities of this service and have verified that the service and findings are accurately documented in the student's note.  Zada Finders, MD 05/05/2017, 1:59 PM

## 2017-05-06 NOTE — Progress Notes (Signed)
   Subjective: Patient denies any further emesis overnight, was able to tolerate small sips with meds yesterday. Nausea is controlled with phenergan. Denies any subjective fevers or chills.  Objective:  Vital signs in last 24 hours: Vitals:   05/05/17 0626 05/05/17 1402 05/05/17 2200 05/06/17 0605  BP: 122/77 117/65 (!) 115/56 115/68  Pulse: (!) 52 (!) 56 (!) 50 93  Resp: 18  17 18   Temp: 97.9 F (36.6 C) 98.4 F (36.9 C) 97.9 F (36.6 C) 97.9 F (36.6 C)  TempSrc:  Oral Oral   SpO2: 97% 100% 96% 98%  Weight:      Height:       Weight change:   Intake/Output Summary (Last 24 hours) at 05/06/17 1003 Last data filed at 05/06/17 0606  Gross per 24 hour  Intake          1388.33 ml  Output             2100 ml  Net          -711.67 ml   Gen: Lying comfortably in bed. Neuro:  Alert and oriented x3.   CV: RRR with no murmurs, rubs, or gallops. Pulm:  CTAB, no wheezes or rhonchi. Abdomen: Unchanged abdominal exam. Tender on right side without rebound tenderness. Non-distended. BS appreciated.  Extremities: No LE edema.   Assessment/Plan:  Principal Problem:   Nausea and vomiting Active Problems:   Chronic hepatitis C without hepatic coma (HCC)   Status post appendectomy   History of peptic ulcer disease   Travis Callahan is a 26 year old with history of perforated peptic ulcer complicated by peritonitis, h/oappendectomy, andchronic HCV hospitalized for acute onsetnausea, vomiting, and abdominal pain likely 2/2 partial bowel obstruction.  Nausea and abdominal pain 2/2 partial bowel obstruction: Still has not had a bowel movement. We still think this is partial obstruction of small bowel. He does not report any infectious symptoms and workup including negative CT scan, lipase, HIDA, and recent negative EGD. KUB yesterday with normal bowel gas pattern. Will try slowly advancing diet with clear liquids. - Clear liquid diet - advance slowly - If unable to tolerate clear  liquids we will place an NG tube for decompression - promethazine12.5 mg every 6 hours as neededfor nausea alternating with ondansetron 4 mg every 8 hours -  Miralax TID and Senakot-S 2 tabs BID  - D5 1/2NS w/ 20 mEq K given NPO - Tylenol 1000 mg q8h prn for pain - Percocet 5-325 q6h prn; Dilaudid 0.5 mg q6h prn severe pain  Chronic Hepatitis C:Positive Hep C antibody titers in Jan 2017. Untreated to date. HIV negative.  -Recommend outpatient follow-up   Chronic normocytic anemia: Baseline 11-12 per Care Everywhere. B12 and ferritin at low normal, but will not exacerbate his lack of bowel movements with Fe supplementation.    LOS: 4 days   Zada Finders, MD 05/06/2017, 10:03 AM

## 2017-05-07 LAB — COMPREHENSIVE METABOLIC PANEL
ALBUMIN: 3.8 g/dL (ref 3.5–5.0)
ALK PHOS: 73 U/L (ref 38–126)
ALT: 37 U/L (ref 17–63)
AST: 35 U/L (ref 15–41)
Anion gap: 7 (ref 5–15)
BILIRUBIN TOTAL: 0.9 mg/dL (ref 0.3–1.2)
CALCIUM: 9.1 mg/dL (ref 8.9–10.3)
CO2: 27 mmol/L (ref 22–32)
CREATININE: 1.19 mg/dL (ref 0.61–1.24)
Chloride: 104 mmol/L (ref 101–111)
GFR calc Af Amer: >60 mL/min (ref >60)
GFR calc non Af Amer: >60 mL/min (ref >60)
GLUCOSE: 98 mg/dL (ref 65–99)
Potassium: 4.1 mmol/L (ref 3.5–5.1)
SODIUM: 138 mmol/L (ref 135–145)
TOTAL PROTEIN: 6.7 g/dL (ref 6.5–8.1)

## 2017-05-07 MED ORDER — KCL IN DEXTROSE-NACL 20-5-0.45 MEQ/L-%-% IV SOLN
INTRAVENOUS | Status: AC
  Administered 2017-05-07: 12:00:00 via INTRAVENOUS
  Administered 2017-05-07 – 2017-05-08 (×2): 1 mL via INTRAVENOUS
  Filled 2017-05-07 (×3): qty 1000

## 2017-05-07 NOTE — Discharge Summary (Signed)
Name: Travis Callahan MRN: [id number] DOB: 1991-05-01 26 y.o. PCP: Patient, No Pcp Per  Date of Admission: 04/30/2017  9:47 PM Date of Discharge: 05/09/2017 Attending Physician: Annia Belt, MD  Discharge Diagnosis: 1. Partial small bowel obstruction 2. Left hydrocele Principal Problem:   Nausea and vomiting Active Problems:   Chronic hepatitis C without hepatic coma (HCC)   Status post appendectomy   History of peptic ulcer disease   Discharge Medications: Allergies as of 05/09/2017      Reactions   Penicillins Other (See Comments)   HAS TOLERATED CEFAZOLIN WITHOUT PROBLEM Childhood reaction Has patient had a PCN reaction causing immediate rash, facial/tongue/throat swelling, SOB or lightheadedness with hypotension: Unknown Has patient had a PCN reaction causing severe rash involving mucus membranes or skin necrosis: No Has patient had a PCN reaction that required hospitalization No Has patient had a PCN reaction occurring within the last 10 years: No If all of the above answers are "NO", then may proceed with Cephalosporin [place]   Nsaids Other (See Comments)   Ulcers    Ivp Dye [iodinated Diagnostic Agents] Hives, Itching, Other (See Comments)   Needs solumedrol and benadryl prior   Ketorolac Tromethamine Hives, Itching, Other (See Comments)   STOMACH ULCER   Morphine And Related Hives, Itching   Tramadol Nausea And Vomiting   Ulcer      Medication List    STOP taking these medications   ALPRAZolam 0.5 MG tablet Commonly known as:  XANAX   dicyclomine 10 MG capsule Commonly known as:  BENTYL   diphenhydrAMINE 25 mg capsule Commonly known as:  BENADRYL   ondansetron 4 MG disintegrating tablet Commonly known as:  ZOFRAN ODT   predniSONE 10 MG (21) Tbpk tablet Commonly known as:  STERAPRED UNI-PAK 21 TAB   sulfamethoxazole-trimethoprim 800-160 MG tablet Commonly known as:  BACTRIM DS,SEPTRA DS   traMADol 50 MG tablet Commonly known as:   ULTRAM     TAKE these medications   ondansetron 4 MG tablet Commonly known as:  ZOFRAN Take 1 tablet (4 mg total) by mouth every 8 (eight) hours as needed for nausea or vomiting.   pantoprazole 40 MG tablet Commonly known as:  PROTONIX Take 1 tablet (40 mg total) by mouth 2 (two) times daily.   polyethylene glycol packet Commonly known as:  MIRALAX / GLYCOLAX Take 17 g by mouth 3 (three) times daily.   promethazine 25 MG tablet Commonly known as:  PHENERGAN Take 1 tablet (25 mg total) by mouth every 6 (six) hours as needed for nausea or vomiting.   senna-docusate 8.6-50 MG tablet Commonly known as:  Senokot-S Take 2 tablets by mouth 2 (two) times daily.       Disposition and follow-up:   Mr.Travis Callahan was discharged from [facility] in stable condition.  At the hospital follow up visit please address:  1.  Partial small bowel obstruction; Hepatitis C treatment.  2.  Labs / imaging needed at time of follow-up: None  3.  Pending labs/ test needing follow-up: None  Follow-up Appointments: Follow-up Information    Schedule an appointment as soon as possible for a visit  with Surgery, [place].   Specialty:  General Surgery Contact information: [address] [place] [address] [phone]           Hospital Course by problem list: Principal Problem:   Nausea and vomiting Active Problems:   Chronic hepatitis C without hepatic coma (HCC)  Status post appendectomy   History of peptic ulcer disease   1. Partial small bowel obstruction: Mr. Travis Callahan has a history of multiple abdominal surgeries including surgeries for perforated peptic ulcer, SBO, and appendectomy who was admitted on 6/11 with 1 day history of intractable nausea vomiting and right sided abdominal pain.  Mr. Travis Callahan routinely takes his PPI and had EGD ~ 3 weeks prior to presentation which was negative for ulcerative disease.  KUB and abdominal  CT scan were negative for obstruction.  CT scan and RUQ ultrasound showed GB wall edema without evidence of gallstones. HIDA scan was negative for acute cholecystitis.  He was advanced to clear liquid without symptoms and then full liquid diet on 6/12.  The following day he complained of increased nausea, abd pain, and acute onset testicular pain(see below) which did not explain abd symptoms.  He was started on Senokot-S BID and Miralax TID on 6/14 given no BMs.   On 6/15, he had multiple episodes of new onset emesis after not taking his PRN antiemetics. Repeat KUB was negative for obstruction.  Symptoms quickly subsided and NG tube was not started.    On 6/17, he had a BM and tolerated his clear liquid diet. General surgery was consulted to see him and said he was safe for outpatient follow up without current indication for surgery.  [facility].   2. L hydrocele: Patient complained of acute onset testicular pain on 6/13.  Initial history was negative for testicular complaints and patient states this was an acute change.  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     Discharge Vitals:   BP 108/63 (BP Location: Right Arm)   Pulse (!) 58   Temp 97.9 F (36.6 C) (Oral)   Resp 16   Ht 5\' 8"  (1.727 m)   Wt 175 lb (79.4 kg)   SpO2 97%   BMI 26.61 kg/m   Pertinent Labs, Studies, and Procedures:   Lipase and LFTs  (6/10)--  wnl KUB (6/10) -  IMPRESSION: 1. No radiographic evidence for acute cardiopulmonary abnormality 2. Nonobstructed bowel-gas pattern  CT (6/11) -  IMPRESSION: 1. Question of minimal soft tissue inflammation about the gallbladder. Would correlate for any evidence of mild cholecystitis. 2. Otherwise unremarkable contrast-enhanced CT of the abdomen and Pelvis.  RUQ [place] (6/11) - IMPRESSION: Question of mild  gallbladder wall edema, with positive ultrasonographic Murphy's sign. No definite stones identified. Would correlate clinically for evidence of mild acute cholecystitis.  HIDA (6/11) - IMPRESSION: 1. Patent cystic duct without evidence for acute cholecystitis.  US scrotum (6/13) - IMPRESSION: Large left-sided hydrocele. No evidence of testicular torsion or epididymo-orchitis. No suspicious testicular or epididymal masses. No varicocele.  KUB (6/15) -  IMPRESSION: Negative supine KUB appearance of the abdomen and pelvis.  Discharge Instructions: Discharge Instructions    Call MD for:  difficulty breathing, headache or visual disturbances    Complete by:  As directed    Call MD for:  hives    Complete by:  As directed    Call MD for:  persistant dizziness or light-headedness    Complete by:  As directed    Call MD for:  persistant nausea and vomiting    Complete by:  As directed    Call MD for:  severe uncontrolled pain    Complete by:  As  directed    Call MD for:  temperature >100.4    Complete by:  As directed    Diet - low sodium heart healthy    Complete by:  As directed    Discharge instructions    Complete by:  As directed    Advance diet slowly. Tolerating clear diet on discharge. Monitor pain control and signs of bowel obstruction.   Increase activity slowly    Complete by:  As directed      - advance diet slowly - continue PPI given PUD history - continue antiemetics with nausea - continue bowel regimen if no BMs - f/u with outpatient surgery   Signed: Zada Finders, MD 05/09/2017, 1:36 PM

## 2017-05-07 NOTE — Progress Notes (Signed)
   Subjective:  Travis Callahan has not had any increased nausea or abdominal pain overnight.  Additionally, he has not had any more episodes of emesis and had a small watery bowel movement overnight.  He did not try his clear liquid diet yesterday, but will try first thing this morning to see how he does.  He denies any new symptoms including fever or chills.   Objective:  Vital signs in last 24 hours: Vitals:   05/06/17 0605 05/06/17 1352 05/06/17 2128 05/07/17 0616  BP: 115/68 118/64 124/66 125/77  Pulse: 93 (!) 46 (!) 56 (!) 59  Resp: 18 18 18    Temp: 97.9 F (36.6 C) 97.9 F (36.6 C) 98.4 F (36.9 C) 98.4 F (36.9 C)  TempSrc:  Oral Oral Oral  SpO2: 98% 100% 99% 98%  Weight:      Height:       Weight change:   Intake/Output Summary (Last 24 hours) at 05/07/17 0803 Last data filed at 05/07/17 0646  Gross per 24 hour  Intake          3176.67 ml  Output             2550 ml  Net           626.67 ml   Gen: Resting in bed in NAD. Neuro:  Alert and oriented x3.   HEENT: NCAT. EOMI. Moist mucous membranes.  CV: RRR with no murmurs, rubs, or gallops. Pulm:  CTAB, no wheezes or rhonchi. Abdomen: Unchanged abd exam.  Tender to palpation on the right side without rebound tenderness.  BS appreciated.  Extremities: No skin lesions appreciated.  No LE edema.    Assessment/Plan:  Principal Problem:   Nausea and vomiting Active Problems:   Chronic hepatitis C without hepatic coma (HCC)   Status post appendectomy   History of peptic ulcer disease   Travis Callahan is a 26 year old man with h/o perforated peptic ulcer complicated by peritonitis, h/oappendectomy, andchronic HCV hospitalized for acute onsetnausea, vomiting, and abdominal pain 2/2 partial bowel obstruction.  Nausea andabdominal pain 2/2 partial bowel obstruction: Showing some improvement with a bowel movement overnight.  Repeat KUB 6/15 with normal bowel gas pattern. Will see how he does on a clear liquid diet  today and consider advancing tomorrow if he tolerates the diet today. - Will re-evaluate today on clear liquid diet with plans to advance slowly - If unable to tolerate clear liquids we will place an NG tube for decompression - promethazine12.5 mg every 6 hours as neededfor nausea alternating with ondansetron 4 mg every 8 hours - Continue Miralax TID and Senakot-S 2 tabs BID  - D5 1/2NS w/ 20 mEq K given NPO - Tylenol 1000 mg q8h prn for pain - Percocet 5-325 q6h prn; Dilaudid 0.5 mg q6h prn severe pain  Chronic Hepatitis C:Untreated with positive Hep C antibody titers in Jan 2017.  -Recommend outpatient treatment   Chronic normocytic anemia: Baseline 11-12 per Care Everywhere. B12 and ferritin at low normal, but will not worsen constipation with Fe supplementation at this time.    LOS: 5 days   Travis Callahan, Medical Student 05/07/2017, 8:03 AM  Attestation for Student Documentation:  I personally was present and performed or re-performed the history, physical exam and medical decision-making activities of this service and have verified that the service and findings are accurately documented in the student's note.  Zada Finders, MD 05/07/2017, 10:44 AM

## 2017-05-07 NOTE — Progress Notes (Signed)
Pt is tolerating clear liquid diet today,eating jello and broth, had extra [place] ice and coffee from the floor. Also had 4 [place] ice and 2 bowls of broth yesterday.

## 2017-05-08 LAB — CBC
HCT: 40.1 % (ref 39.0–52.0)
HEMOGLOBIN: 12.9 g/dL — AB (ref 13.0–17.0)
MCH: 29 pg (ref 26.0–34.0)
MCHC: 32.2 g/dL (ref 30.0–36.0)
MCV: 90.1 fL (ref 78.0–100.0)
Platelets: 241 10*3/uL (ref 150–400)
RBC: 4.45 MIL/uL (ref 4.22–5.81)
RDW: 13.8 % (ref 11.5–15.5)
WBC: 3.9 10*3/uL — AB (ref 4.0–10.5)

## 2017-05-08 LAB — COMPREHENSIVE METABOLIC PANEL
ALBUMIN: 3.8 g/dL (ref 3.5–5.0)
ALK PHOS: 68 U/L (ref 38–126)
ALT: 57 U/L (ref 17–63)
AST: 53 U/L — AB (ref 15–41)
Anion gap: 9 (ref 5–15)
BUN: <5 mg/dL — ABNORMAL LOW (ref 6–20)
CALCIUM: 8.7 mg/dL — AB (ref 8.9–10.3)
CHLORIDE: 103 mmol/L (ref 101–111)
CO2: 25 mmol/L (ref 22–32)
CREATININE: 1.34 mg/dL — AB (ref 0.61–1.24)
GFR calc Af Amer: >60 mL/min (ref >60)
GFR calc non Af Amer: >60 mL/min (ref >60)
GLUCOSE: 145 mg/dL — AB (ref 65–99)
Potassium: 3.6 mmol/L (ref 3.5–5.1)
SODIUM: 137 mmol/L (ref 135–145)
Total Bilirubin: 0.9 mg/dL (ref 0.3–1.2)
Total Protein: 6.2 g/dL — ABNORMAL LOW (ref 6.5–8.1)

## 2017-05-08 MED ORDER — OXYCODONE HCL 5 MG PO TABS
5.0000 mg | ORAL_TABLET | Freq: Once | ORAL | Status: AC
  Administered 2017-05-08: 5 mg via ORAL
  Filled 2017-05-08: qty 1

## 2017-05-08 MED ORDER — KCL IN DEXTROSE-NACL 20-5-0.45 MEQ/L-%-% IV SOLN
INTRAVENOUS | Status: AC
  Administered 2017-05-08: 17:00:00 via INTRAVENOUS
  Filled 2017-05-08: qty 1000

## 2017-05-08 NOTE — Progress Notes (Signed)
Pt now rating abdominal pain "10" after receiving 1000mg  Tylenol at 2041 and 1 Percocet at 1915. Abdomen soft,bowel sounds present. MD paged.

## 2017-05-08 NOTE — Progress Notes (Signed)
Subjective:  Travis Callahan says he has had a hard time eating due to pain.  He says abdominal pain and nausea are about the same as yesterday. Nursing report indicates he tolerated his clear liquid diet last night and the night before well including asking for extra [place] ice last night.  No BM overnight, but had one yesterday.   Objective:  Vital signs in last 24 hours: Vitals:   05/07/17 0616 05/07/17 1511 05/07/17 2048 05/08/17 0509  BP: 125/77 121/69 128/76 115/67  Pulse: (!) 59 61 72 (!) 51  Resp:  18 18 18   Temp: 98.4 F (36.9 C) 98.8 F (37.1 C) 97.9 F (36.6 C) 98 F (36.7 C)  TempSrc: Oral Oral Oral Oral  SpO2: 98% 97% 96% 97%  Weight:      Height:       Weight change:   Intake/Output Summary (Last 24 hours) at 05/08/17 1028 Last data filed at 05/08/17 0620  Gross per 24 hour  Intake          3993.33 ml  Output             1650 ml  Net          2343.33 ml   Gen: Sitting up in bed in NAD. Neuro:  Alert and oriented x3.   HEENT: NCAT. EOMI. Moist mucous membranes.  CV: RRR with no murmurs, rubs, or gallops. Pulm:  CTAB, no wheezes or rhonchi. Abdomen: Unchanged abdominal exam.  Tense abdomen.  Tender to palpation especially on right side.  Palpation on right reproduces pain on left.  Non-hyperactive bowel sounds appreciated.   Extremities: No skin lesions appreciated.  No LE edema.  Pulses in all extremities intact.  Assessment/Plan:  Principal Problem:   Nausea and vomiting Active Problems:   Chronic hepatitis C without hepatic coma (HCC)   Status post appendectomy   History of peptic ulcer disease   Travis Callahan is a 26 year old man with h/o perforated peptic ulcer complicated by peritonitis, h/oappendectomy, andchronic HCV hospitalized for acute onsetnausea, vomiting, and abdominal pain 2/2 partial bowel obstruction.  Nausea andabdominal pain 2/2 partial bowel obstruction: Nursing staff reports good intake on clear liquid diet, but patient  describes increased pain not allowing him to eat well. Repeat KUB 6/15 with normal bowel gas pattern and no more episodes of emesis since then.  Plans to slowly advance his diet or possibly transfer back to his facility for medical treatment if he is stable. - Continue on clear liquid diet with plans to advance slowly - Consult surgery for help with possible partial obstruction - D5 1/2NS w/ 20 mEq K - If regresses, we will place an NG tube for decompression - promethazine12.5 mg every 6 hours as neededfor nausea alternating with ondansetron 4 mg every 8 hours - Continue Miralax TID and Senakot-S 2 tabs BID  - Tylenol 1000 mg q8h prn for pain - Percocet 5-325 q6h prn; Dilaudid 0.5 mg q6h prn severe pain  Chronic Hepatitis C:Untreated with positive Hep C antibody titers in Jan 2017.  -Recommend outpatient treatment   Mild Cr elevation -  Cr has uptrended slightly from admission 1.04 to 1.34 today.  Do not think this is an AKI given mild elevation without any source of new renal insult.  BUN undetectable today, so does not appear to be pre-renal or post-renal. He has been on IVF for most of hospitalization given NPO or limited diet.  Additionally, has not had any nephrotoxic drugs since  contrast on 6/11 with no NSAID use due to h/o peptic ulcer disease.   Chronic normocytic anemia: Hgb 12.9 today.  Baseline 11-12 per Care Everywhere. B12 and ferritin checked during admission were at low normal, but will not worsen BM with Fe supplementation at this time.    LOS: 6 days   [name], Medical Student 05/08/2017, 10:28 AM

## 2017-05-08 NOTE — Care Management (Signed)
Patient is an inmate.[facility] ,Updated [place] at  [phone] patient may discharge today. If so [facility] requesting discharge summary be faxed to [facility] at  [phone] and bedside nurse call report to her ( she leaves at 1630) , after 1630 bedside nurse to call report to nurses station phone [phone].  [place] [phone]

## 2017-05-08 NOTE — Progress Notes (Signed)
Patient complaining of stabbing abdominal pain unrelieved by 1 Percocet given at 1915. Rates pain "9" out of 10. He is asking if I can ask the doctor for Dilaudid. MD paged.
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
## 2017-05-09 MED ORDER — ONDANSETRON HCL 4 MG PO TABS: 4.0000 mg | ORAL_TABLET | Freq: Three times a day (TID) | ORAL | 0 refills | Status: DC | PRN

## 2017-05-09 MED ORDER — POLYETHYLENE GLYCOL 3350 17 G PO PACK: 17.0000 g | PACK | Freq: Three times a day (TID) | ORAL | 0 refills | Status: AC

## 2017-05-09 MED ORDER — POLYETHYLENE GLYCOL 3350 17 G PO PACK: 17.0000 g | PACK | Freq: Three times a day (TID) | ORAL | 0 refills | Status: DC

## 2017-05-09 MED ORDER — ONDANSETRON HCL 4 MG PO TABS: 4.0000 mg | ORAL_TABLET | Freq: Three times a day (TID) | ORAL | 0 refills | Status: AC | PRN

## 2017-05-09 MED ORDER — PROMETHAZINE HCL 25 MG PO TABS: 25.0000 mg | ORAL_TABLET | Freq: Four times a day (QID) | ORAL | 0 refills | Status: AC | PRN

## 2017-05-09 MED ORDER — OXYCODONE-ACETAMINOPHEN 5-325 MG PO TABS
1.0000 | ORAL_TABLET | Freq: Four times a day (QID) | ORAL | Status: DC | PRN
  Administered 2017-05-09: 1 via ORAL
  Administered 2017-05-09 (×2): 2 via ORAL
  Filled 2017-05-09 (×3): qty 2

## 2017-05-09 MED ORDER — PANTOPRAZOLE SODIUM 40 MG PO TBEC: 40.0000 mg | DELAYED_RELEASE_TABLET | Freq: Two times a day (BID) | ORAL | 0 refills | Status: AC

## 2017-05-09 MED ORDER — SENNOSIDES-DOCUSATE SODIUM 8.6-50 MG PO TABS: 2.0000 | ORAL_TABLET | Freq: Two times a day (BID) | ORAL | 0 refills | Status: DC

## 2017-05-09 MED ORDER — SENNOSIDES-DOCUSATE SODIUM 8.6-50 MG PO TABS: 2.0000 | ORAL_TABLET | Freq: Two times a day (BID) | ORAL | 0 refills | Status: AC

## 2017-05-09 NOTE — Care Management (Signed)
Patient is an inmate.[facility] . Discharge is today. Left voice mail for  [place] at  [phone] Insurance underwriter at [facility]). Spoke with Javier Glazier,  Med Dir at [place] aware discharge is today .Javier Glazier requesting discharge summary be faxed to [facility] at  [phone] and bedside nurse call report to Danae Chen  ( she leaves at 1630) , after 1630 bedside nurse to call report to nurses station phone [phone].  Magdalen Spatz RN BSN [phone]

## 2017-05-09 NOTE — Progress Notes (Signed)
Travis Callahan from [facility] called me and stated that Mr. Dicenzo would not be returning to them, but they would send info that I reported to  Javier Glazier to the prison.

## 2017-05-09 NOTE — Discharge Instructions (Signed)
Use the pain medicine as needed. Zofran for nausea. Make sure you follow up with the general surgery to monitor your gallbladder. If you develop fevers return to the ED.

## 2017-05-09 NOTE — Progress Notes (Signed)
   Subjective:  Travis Callahan did well overnight without IV pain medication.  Travis Callahan states that Travis Callahan did have about the same level of pain, but oral pain medications were effective.  Travis Callahan denies any fever, chills, or CP today.  Tolerating clear liquid diet.  Objective:  Vital signs in last 24 hours: Vitals:   05/07/17 2048 05/08/17 0509 05/08/17 2205 05/09/17 0515  BP: 128/76 115/67 118/72 108/63  Pulse: 72 (!) 51 69 (!) 58  Resp: 18 18 16 16   Temp: 97.9 F (36.6 C) 98 F (36.7 C) 97.2 F (36.2 C) 97.9 F (36.6 C)  TempSrc: Oral Oral Oral Oral  SpO2: 96% 97% 96% 97%  Weight:      Height:       Weight change:   Intake/Output Summary (Last 24 hours) at 05/09/17 0754 Last data filed at 05/09/17 0700  Gross per 24 hour  Intake          1926.67 ml  Output             1900 ml  Net            26.67 ml   Gen: Well-appearing in NAD. Neuro:  Alert and oriented x3. HEENT: NCAT. EOMI. Moist mucous membranes.  CV: RRR with no murmurs, rubs, or gallops. Pulm:  CTAB, no wheezes or rhonchi. Abdomen: Unchanged abdominal exam with right sided pain > left. Tense abdomen without rebound tenderness. Non-hyperactive BS appreciated.  Extremities: No skin lesions appreciated.  No LE edema.   Assessment/Plan:  Principal Problem:   Nausea and vomiting Active Problems:   Chronic hepatitis C without hepatic coma (HCC)   Status post appendectomy   History of peptic ulcer disease  Travis Callahan is a 26 year old man with h/o perforated peptic ulcer complicated by peritonitis, h/oappendectomy, andchronic HCV hospitalized for acute onsetnausea, vomiting, and abdominal pain 2/2 partial bowel obstruction.  Nausea andabdominal pain 2/2 partial bowel obstruction: Is tolerating clear liquid diet and soft foods and did well without IV pain meds overnight. Surgery will follow him as an outpatient.  Plans to transfer back to facility today given Travis Callahan is stable, workup has been negative for etiology requiring  hospital intervention, and surgery has signed off.  - Plan to advance diet slowly at facility - D5 1/2NS w/ 20 mEq K until discharge - promethazine12.5 mg every 6 hours as neededfor nausea alternating with ondansetron 4 mg every 8 hours - Continue Miralax TID and Senakot-S 2 tabs BID  - Tylenol 1000 mg q8h prn for pain  Chronic Hepatitis C:Untreated with positive Hep C antibody titers in Jan 2017.  -Recommend outpatient treatment  Chronic normocytic anemia: Baseline 11-12 per Care Everywhere. B12 and ferritin checked during admission were at low normal, but will not worsen BM with Fe supplementation at this time.    LOS: 7 days   [name], Medical Student 05/09/2017, 7:54 AM  Attestation for Student Documentation:  I personally was present and performed or re-performed the history, physical exam and medical decision-making activities of this service and have verified that the service and findings are accurately documented in the student's note.  Travis Finders, MD 05/09/2017, 11:57 AM

## 2017-05-09 NOTE — Progress Notes (Signed)
Report called to [place] at [phone]

## 2017-05-09 NOTE — Progress Notes (Deleted)
42ml Hydromorphone PCA wasted with Rakita RN(witnessed)

## 2017-05-09 NOTE — Care Management (Signed)
Faxed discharge summary to  [place] at [facility] at  [phone] .   Bedside nurse calling report to [facility]. Magdalen Spatz RN BSN [phone]

## 2017-09-25 IMAGING — CR DG HAND 2V*R*
2 series · 2 of 2 positions shown · non-contrast
Comparison: None.

CLINICAL DATA: Puncture wound about the fifth metacarpal. Foreign
body.

EXAM:
RIGHT HAND - 2 VIEW

[hand pa]
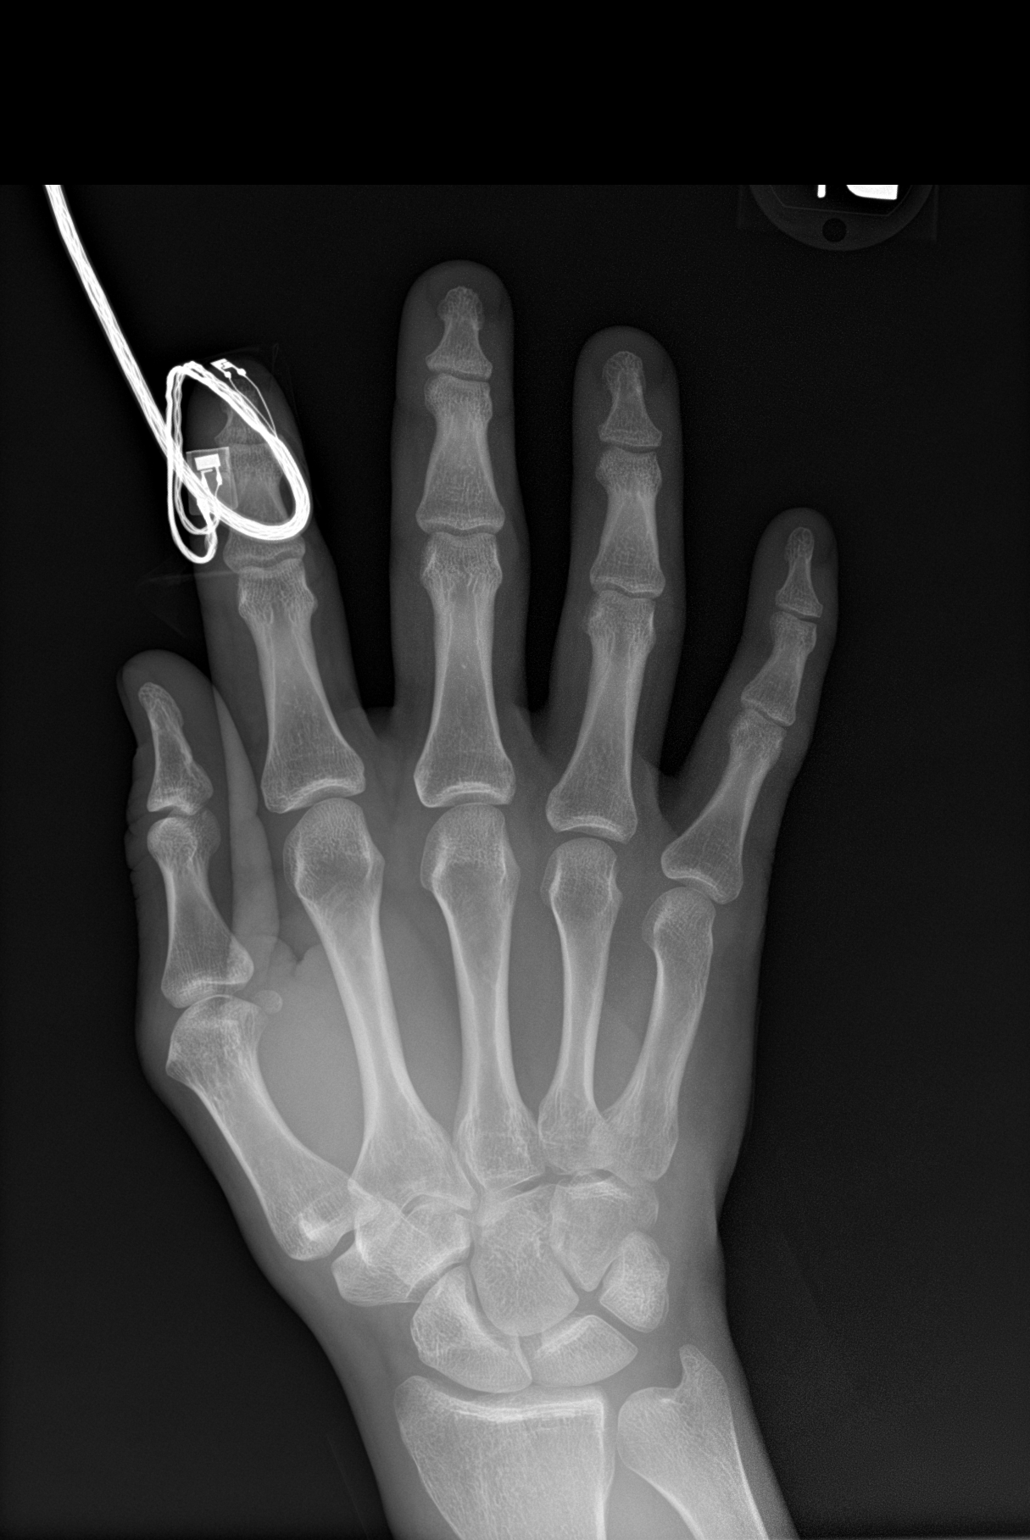

[hand lat]
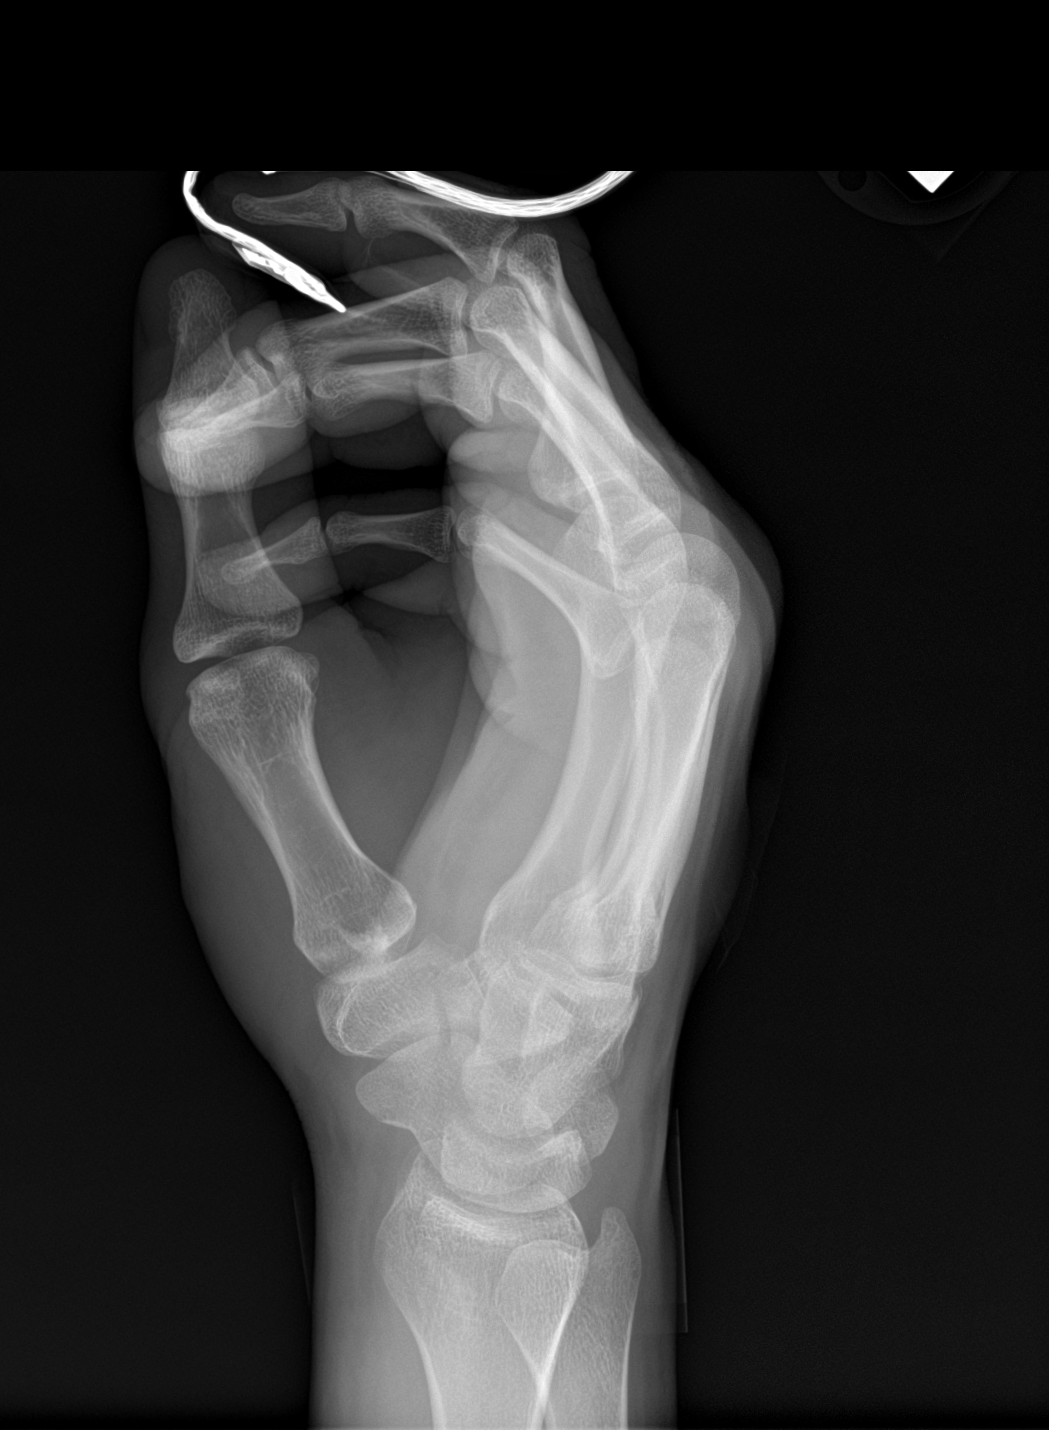

[2 of 2 positions shown; findings below may reference images not displayed]

FINDINGS: No fracture or dislocation. The alignment and joint spaces are
maintained. No erosion, periosteal reaction or bony destructive
change. No radiopaque foreign body. No soft tissue air. Mild soft
tissue prominence about the dorsum of the hand.
IMPRESSION: No radiopaque foreign body or acute osseous abnormality.

## 2017-09-25 IMAGING — CR DG HAND COMPLETE 3+V*L*
3 series · 3 of 3 positions shown · non-contrast
Comparison: None.

CLINICAL DATA: Possible injury to the dorsum of the left hand 1
week ago, with abscess across the metacarpals. Initial encounter.

EXAM:
LEFT HAND - COMPLETE 3+ VIEW

[hand pa]
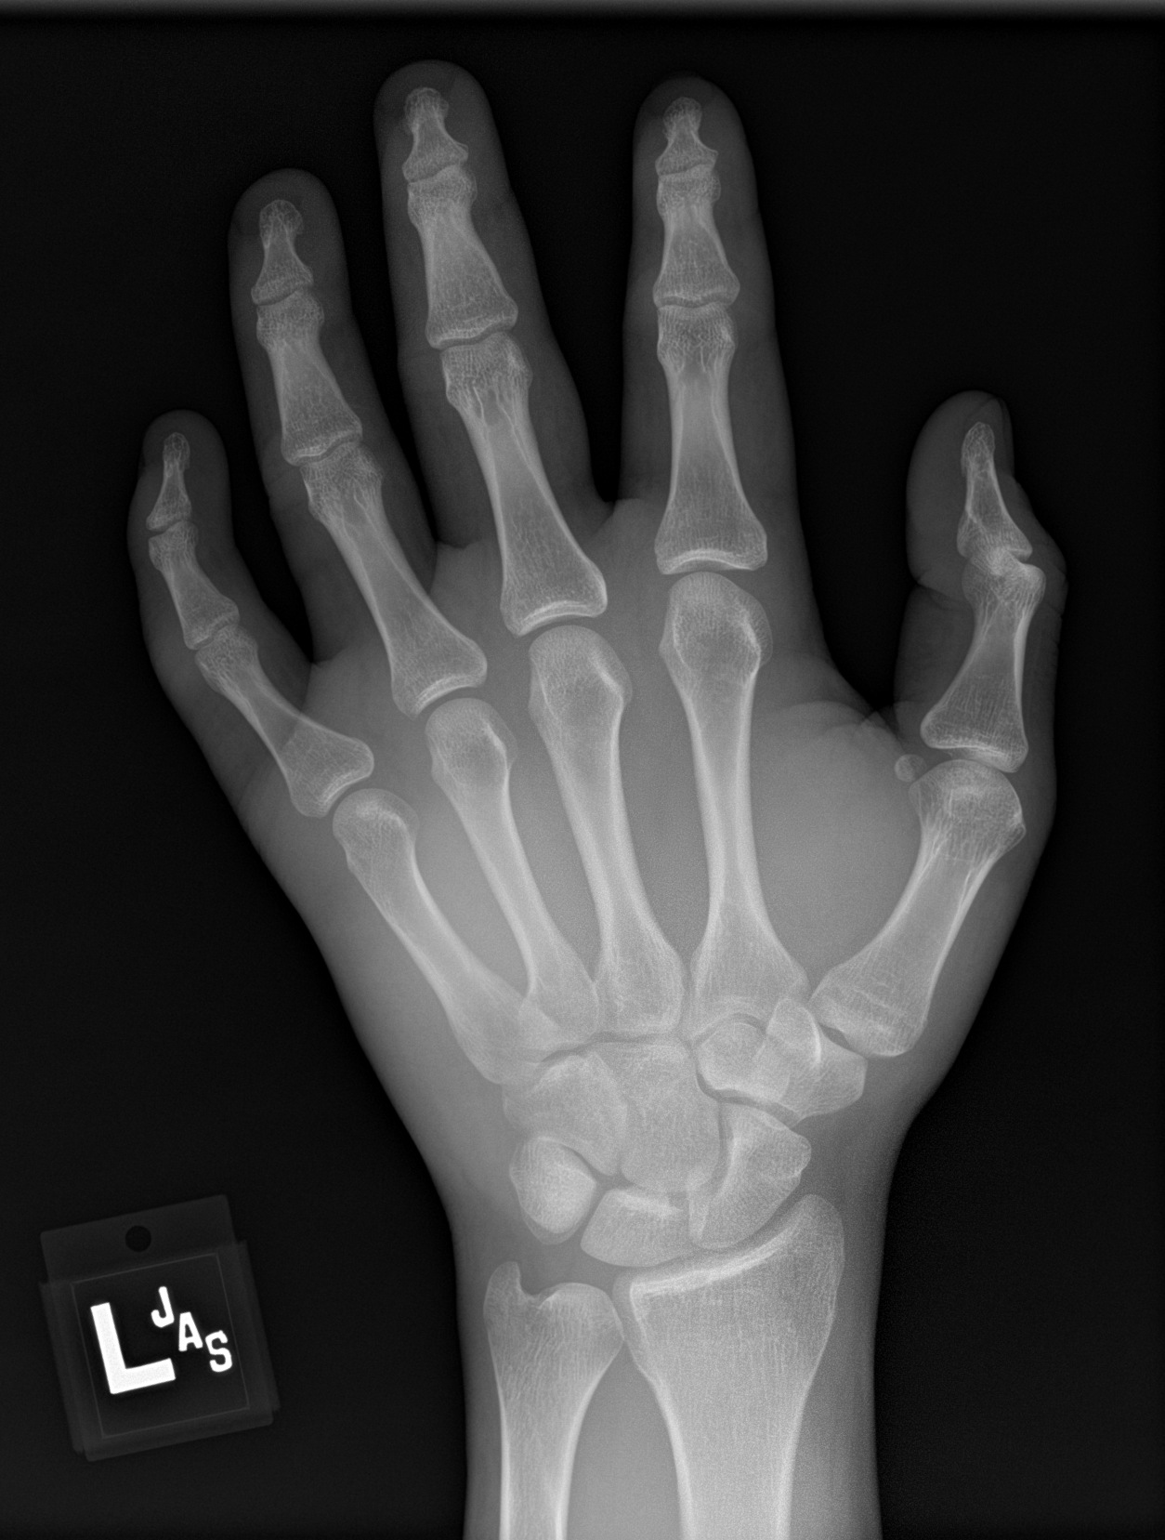

[hand obl]
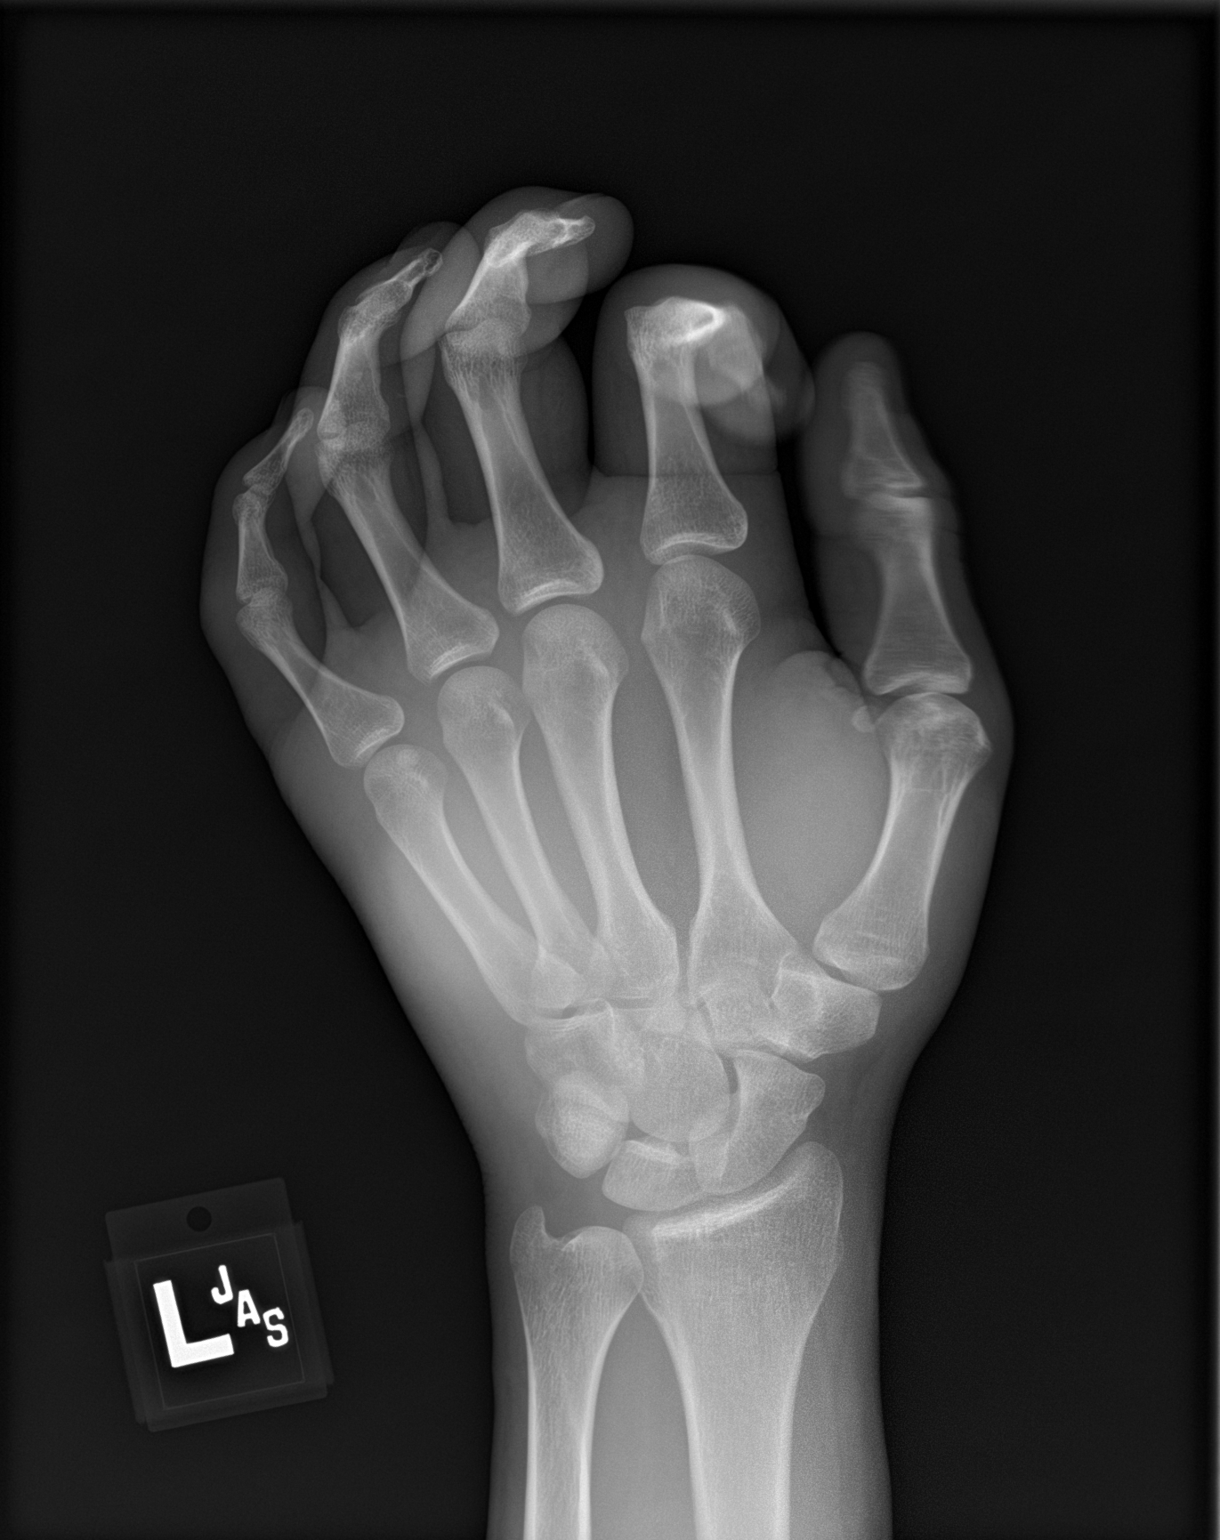

[hand lat]
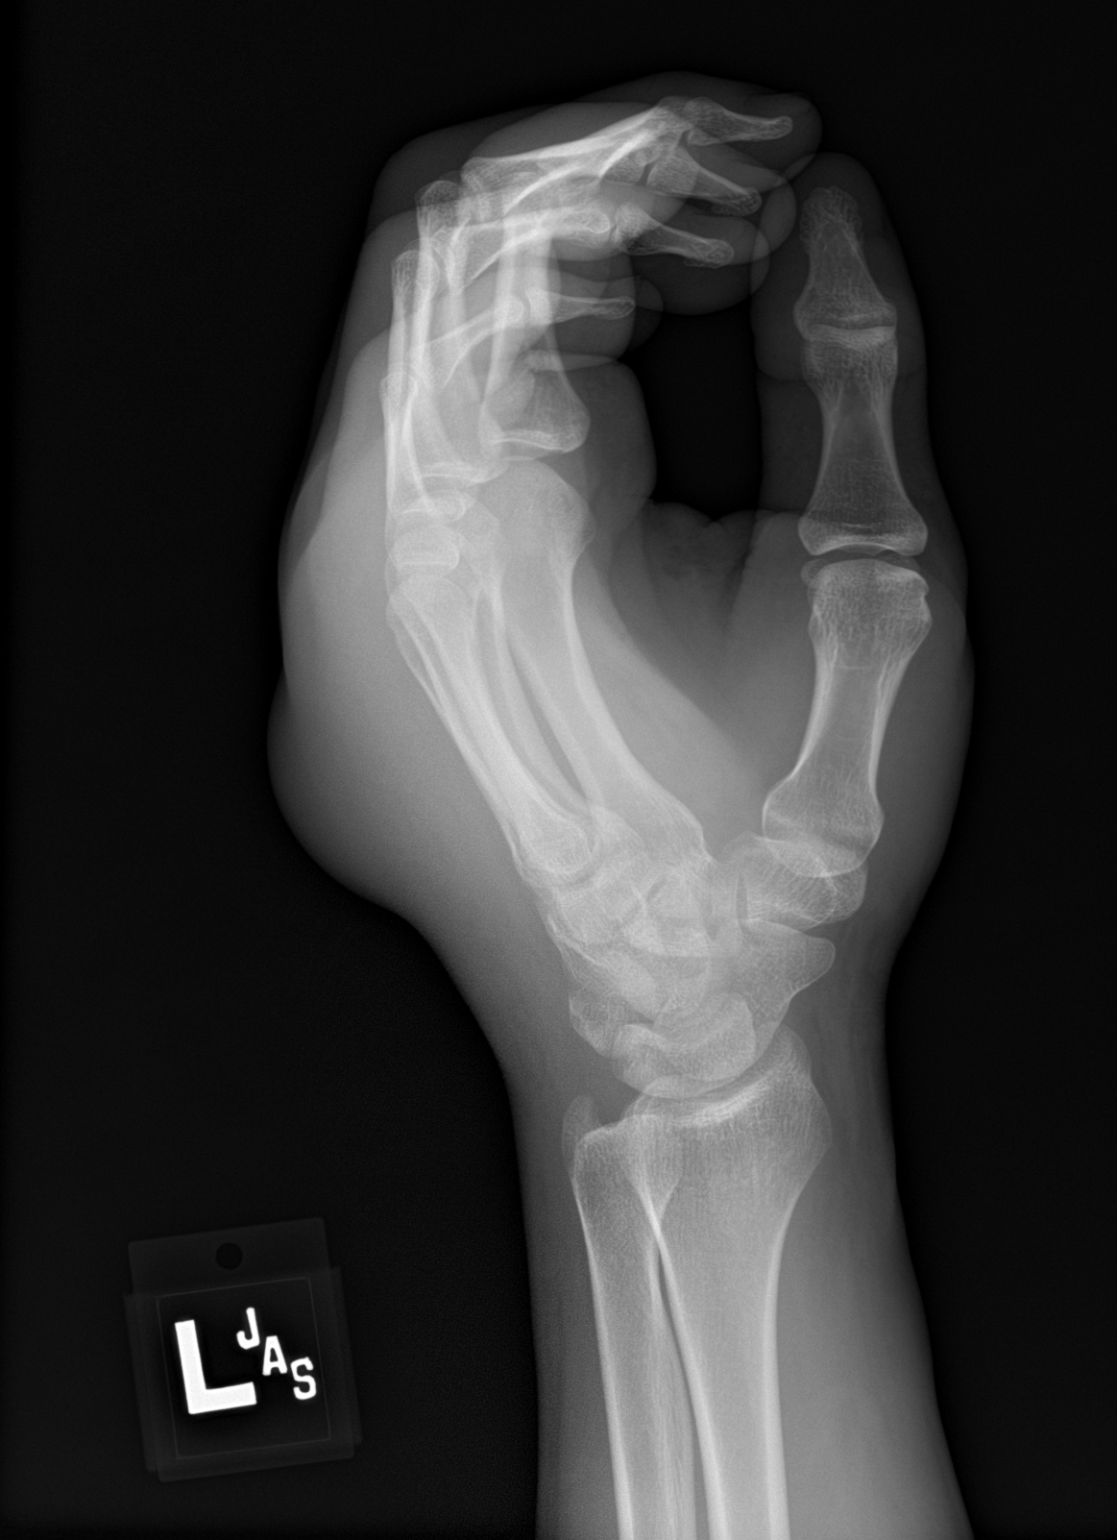

[3 of 3 positions shown; findings below may reference images not displayed]

FINDINGS: There is no evidence of fracture or dislocation. There is no
evidence of osseous erosion. The joint spaces are preserved. The
carpal rows are intact, and demonstrate normal alignment. Marked
dorsal soft tissue swelling is noted. No radiopaque foreign bodies
are seen.
IMPRESSION: No evidence of fracture or dislocation. No evidence of osseous
erosion. Marked dorsal soft tissue swelling noted. No radiopaque
foreign bodies seen.

## 2019-07-23 ENCOUNTER — Emergency Department (HOSPITAL_COMMUNITY)
Admission: EM | Admit: 2019-07-23 | Discharge: 2019-07-23 | Discharge status: Against Medical Advice | Payer: Medicaid - Out of State | Attending: Emergency Medicine | Admitting: Emergency Medicine

## 2019-07-23 ENCOUNTER — Encounter (HOSPITAL_COMMUNITY): Payer: Self-pay | Admitting: Emergency Medicine

## 2019-07-23 ENCOUNTER — Other Ambulatory Visit: Payer: Self-pay

## 2019-07-23 DIAGNOSIS — W260XXA Contact with knife, initial encounter: Secondary | ICD-10-CM | POA: Diagnosis not present

## 2019-07-23 DIAGNOSIS — S61213A Laceration without foreign body of left middle finger without damage to nail, initial encounter: Secondary | ICD-10-CM | POA: Diagnosis present

## 2019-07-23 DIAGNOSIS — Y999 Unspecified external cause status: Secondary | ICD-10-CM | POA: Insufficient documentation

## 2019-07-23 DIAGNOSIS — Y9389 Activity, other specified: Secondary | ICD-10-CM | POA: Diagnosis not present

## 2019-07-23 DIAGNOSIS — Y929 Unspecified place or not applicable: Secondary | ICD-10-CM | POA: Insufficient documentation

## 2019-07-23 DIAGNOSIS — Z5321 Procedure and treatment not carried out due to patient leaving prior to being seen by health care provider: Secondary | ICD-10-CM | POA: Diagnosis not present

## 2019-07-23 NOTE — ED Triage Notes (Signed)
Pt reports that he was helping build a fence and when cutting zip ties he cut tip of left middle finger about two hours ago. Reports had hard time getting it to stop bleeding. When unwrapped during triage not bleeding at this time. Wrapped with saline damp guaze and Coban.

## 2019-09-04 ENCOUNTER — Emergency Department (HOSPITAL_COMMUNITY): Payer: Medicaid - Out of State

## 2019-09-04 ENCOUNTER — Emergency Department (HOSPITAL_COMMUNITY)
Admission: EM | Admit: 2019-09-04 | Discharge: 2019-09-04 | Disposition: A | Discharge status: Home / Self Care | Payer: Medicaid - Out of State | Attending: Emergency Medicine | Admitting: Emergency Medicine

## 2019-09-04 ENCOUNTER — Encounter (HOSPITAL_COMMUNITY): Payer: Self-pay | Admitting: Emergency Medicine

## 2019-09-04 ENCOUNTER — Other Ambulatory Visit: Payer: Self-pay

## 2019-09-04 DIAGNOSIS — R0781 Pleurodynia: Secondary | ICD-10-CM | POA: Insufficient documentation

## 2019-09-04 DIAGNOSIS — M542 Cervicalgia: Secondary | ICD-10-CM | POA: Insufficient documentation

## 2019-09-04 DIAGNOSIS — M549 Dorsalgia, unspecified: Secondary | ICD-10-CM | POA: Insufficient documentation

## 2019-09-04 DIAGNOSIS — Y9389 Activity, other specified: Secondary | ICD-10-CM | POA: Insufficient documentation

## 2019-09-04 DIAGNOSIS — W11XXXA Fall on and from ladder, initial encounter: Secondary | ICD-10-CM | POA: Insufficient documentation

## 2019-09-04 DIAGNOSIS — R1110 Vomiting, unspecified: Secondary | ICD-10-CM | POA: Diagnosis not present

## 2019-09-04 DIAGNOSIS — Z79899 Other long term (current) drug therapy: Secondary | ICD-10-CM | POA: Insufficient documentation

## 2019-09-04 DIAGNOSIS — M545 Low back pain, unspecified: Secondary | ICD-10-CM

## 2019-09-04 DIAGNOSIS — Y929 Unspecified place or not applicable: Secondary | ICD-10-CM | POA: Diagnosis not present

## 2019-09-04 DIAGNOSIS — W19XXXA Unspecified fall, initial encounter: Secondary | ICD-10-CM

## 2019-09-04 DIAGNOSIS — Z87891 Personal history of nicotine dependence: Secondary | ICD-10-CM | POA: Insufficient documentation

## 2019-09-04 MED ORDER — ONDANSETRON 4 MG PO TBDP
4.0000 mg | ORAL_TABLET | Freq: Once | ORAL | Status: AC
  Administered 2019-09-04: 4 mg via ORAL
  Filled 2019-09-04: qty 1

## 2019-09-04 MED ORDER — OXYCODONE-ACETAMINOPHEN 5-325 MG PO TABS
1.0000 | ORAL_TABLET | Freq: Once | ORAL | Status: AC
  Administered 2019-09-04: 1 via ORAL
  Filled 2019-09-04: qty 1

## 2019-09-04 MED ORDER — CYCLOBENZAPRINE HCL 5 MG PO TABS: 5.0000 mg | ORAL_TABLET | Freq: Two times a day (BID) | ORAL | 0 refills | Status: AC | PRN
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
## 2019-09-04 NOTE — ED Triage Notes (Addendum)
Fell 6 feet backwards off ladder yesterday, landing on concrete/grass- low back hurting at present-- no loss of consciousness--  Has vomited x 2 today-- denies hitting head-- has swelling to left side of back, no ecchymosis at present. States urine is normal.   Pt has extensive history of abd surgeries. And skull fx

## 2019-09-04 NOTE — ED Provider Notes (Signed)
28 year old male screened in triage.  Brief history includes a fall from approximately 8 feet landing on his back.  No loss of consciousness.  He notes pain to his left lateral ribs thoracic and lumbar mid back.  Patient notes some soreness in his neck but no point tenderness, no headache but has had several episodes of vomiting since the fall.  He does have a history of skull fracture previously.  He denies any abdominal pain or neurological deficits.   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

## 2019-09-04 NOTE — ED Notes (Signed)
AVS and prescription given to patient discharge teaching performed.  Patient has no further questions

## 2019-09-04 NOTE — ED Notes (Signed)
Patient ambulated to restroom, patient was not wearing c-collar, I asked him about his c-collar an he said he took it off.  He reports that his neck does not bother him at all.

## 2019-09-04 NOTE — Discharge Instructions (Signed)
Please read attached information. If you experience any new or worsening signs or symptoms please return to the emergency room for evaluation. Please follow-up with your primary care provider or specialist as discussed. Please use medication prescribed only as directed and discontinue taking if you have any concerning signs or symptoms.   °

## 2019-11-11 ENCOUNTER — Encounter (HOSPITAL_COMMUNITY): Payer: Self-pay | Admitting: Emergency Medicine

## 2019-11-11 ENCOUNTER — Other Ambulatory Visit: Payer: Self-pay

## 2019-11-11 ENCOUNTER — Emergency Department (HOSPITAL_COMMUNITY)
Admission: EM | Admit: 2019-11-11 | Discharge: 2019-11-11 | Disposition: A | Discharge status: Home / Self Care | Payer: Medicaid - Out of State | Attending: Emergency Medicine | Admitting: Emergency Medicine

## 2019-11-11 ENCOUNTER — Emergency Department (HOSPITAL_COMMUNITY): Payer: Medicaid - Out of State

## 2019-11-11 DIAGNOSIS — R0602 Shortness of breath: Secondary | ICD-10-CM | POA: Insufficient documentation

## 2019-11-11 DIAGNOSIS — R062 Wheezing: Secondary | ICD-10-CM | POA: Insufficient documentation

## 2019-11-11 DIAGNOSIS — R079 Chest pain, unspecified: Secondary | ICD-10-CM | POA: Diagnosis present

## 2019-11-11 DIAGNOSIS — Z87891 Personal history of nicotine dependence: Secondary | ICD-10-CM | POA: Insufficient documentation

## 2019-11-11 DIAGNOSIS — Z79899 Other long term (current) drug therapy: Secondary | ICD-10-CM | POA: Insufficient documentation

## 2019-11-11 DIAGNOSIS — R109 Unspecified abdominal pain: Secondary | ICD-10-CM | POA: Insufficient documentation

## 2019-11-11 DIAGNOSIS — R0789 Other chest pain: Secondary | ICD-10-CM | POA: Insufficient documentation

## 2019-11-11 DIAGNOSIS — W19XXXA Unspecified fall, initial encounter: Secondary | ICD-10-CM

## 2019-11-11 LAB — BASIC METABOLIC PANEL
Anion gap: 11 (ref 5–15)
BUN: 15 mg/dL (ref 6–20)
CO2: 24 mmol/L (ref 22–32)
Calcium: 9.2 mg/dL (ref 8.9–10.3)
Chloride: 102 mmol/L (ref 98–111)
Creatinine, Ser: 1 mg/dL (ref 0.61–1.24)
GFR calc Af Amer: >60 mL/min (ref >60)
GFR calc non Af Amer: >60 mL/min (ref >60)
Glucose, Bld: 113 mg/dL — ABNORMAL HIGH (ref 70–99)
Potassium: 4.1 mmol/L (ref 3.5–5.1)
Sodium: 137 mmol/L (ref 135–145)

## 2019-11-11 LAB — CBC
HCT: 41.1 % (ref 39.0–52.0)
Hemoglobin: 14.2 g/dL (ref 13.0–17.0)
MCH: 32.2 pg (ref 26.0–34.0)
MCHC: 34.5 g/dL (ref 30.0–36.0)
MCV: 93.2 fL (ref 80.0–100.0)
Platelets: 331 10*3/uL (ref 150–400)
RBC: 4.41 MIL/uL (ref 4.22–5.81)
RDW: 13.2 % (ref 11.5–15.5)
WBC: 8.4 10*3/uL (ref 4.0–10.5)
nRBC: 0 % (ref 0.0–0.2)

## 2019-11-11 LAB — TROPONIN I (HIGH SENSITIVITY): Troponin I (High Sensitivity): 4 ng/L (ref <18)

## 2019-11-11 MED ORDER — ALBUTEROL SULFATE HFA 108 (90 BASE) MCG/ACT IN AERS: 1.0000 | INHALATION_SPRAY | Freq: Four times a day (QID) | RESPIRATORY_TRACT | 0 refills | Status: AC | PRN

## 2019-11-11 NOTE — ED Triage Notes (Signed)
Patient presents ambulatory c/o chest pain like a ban across the top of his chest x 1 week. Saturday patient was on roof working and pain was severe causing some dizziness and patent fell about 6 feet landing on sheet rock. C/o left sided rib cage pain and left abdominal pain.

## 2019-11-11 NOTE — Discharge Instructions (Addendum)
Please know we cannot complete your full work-up.  I have prescribed an inhaler which should help with your wheezing.  Please return to the emergency department when available in order to complete your work-up with further CT of your chest.  Follow-up with your primary care physician as needed.
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
## 2019-11-11 NOTE — ED Notes (Signed)
Patient approached the nurses station, stated he was seen by the provider a few minutes ago  and was told that they are going to look at his chart to see about a scan or repeat xray. Informed patient no new orders noted at this time, patient stated he needs to get going, and can not stay to wait too long. Re-informed patient of still in process and will notify provider. Patient insisted on needing to leave and told patient might need to sign AMA and agreed to sign if needed.

## 2022-07-18 ENCOUNTER — Emergency Department (HOSPITAL_COMMUNITY)
Admission: EM | Admit: 2022-07-18 | Discharge: 2022-07-18 | Discharge status: Against Medical Advice | Payer: BC Managed Care – PPO | Attending: Emergency Medicine | Admitting: Emergency Medicine

## 2022-07-18 ENCOUNTER — Emergency Department (HOSPITAL_COMMUNITY)
Admission: EM | Admit: 2022-07-18 | Discharge: 2022-07-18 | Discharge status: Against Medical Advice | Payer: BC Managed Care – PPO

## 2022-07-18 ENCOUNTER — Encounter (HOSPITAL_COMMUNITY): Payer: Self-pay

## 2022-07-18 ENCOUNTER — Other Ambulatory Visit: Payer: Self-pay

## 2022-07-18 DIAGNOSIS — M79605 Pain in left leg: Secondary | ICD-10-CM | POA: Insufficient documentation

## 2022-07-18 DIAGNOSIS — M79602 Pain in left arm: Secondary | ICD-10-CM | POA: Diagnosis present

## 2022-07-18 DIAGNOSIS — Z5321 Procedure and treatment not carried out due to patient leaving prior to being seen by health care provider: Secondary | ICD-10-CM | POA: Insufficient documentation

## 2022-07-18 DIAGNOSIS — Y92410 Unspecified street and highway as the place of occurrence of the external cause: Secondary | ICD-10-CM | POA: Diagnosis not present

## 2022-07-18 DIAGNOSIS — M25552 Pain in left hip: Secondary | ICD-10-CM | POA: Insufficient documentation

## 2022-07-18 DIAGNOSIS — M549 Dorsalgia, unspecified: Secondary | ICD-10-CM | POA: Diagnosis not present

## 2022-07-18 NOTE — ED Notes (Signed)
Patient was called from the waiting room. No response.

## 2022-07-18 NOTE — ED Notes (Signed)
No longer wanted to wait  

## 2022-07-18 NOTE — ED Notes (Signed)
Patient angry bc the wait is long and is trying to leave prior to being seen by PA.  Explained the process and said he would wait until he sees the PA
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
